# Patient Record
Sex: Male | Born: 1965 | Race: White | Hispanic: Yes | Smoking: Never smoker
Health system: Southern US, Community
[De-identification: ages and names within clinical notes are randomized; demographics above are authoritative.]

## PROBLEM LIST (undated history)

## (undated) DIAGNOSIS — I4891 Unspecified atrial fibrillation: Secondary | ICD-10-CM

## (undated) DIAGNOSIS — R030 Elevated blood-pressure reading, without diagnosis of hypertension: Secondary | ICD-10-CM

## (undated) DIAGNOSIS — F419 Anxiety disorder, unspecified: Secondary | ICD-10-CM

## (undated) DIAGNOSIS — N529 Male erectile dysfunction, unspecified: Secondary | ICD-10-CM

## (undated) DIAGNOSIS — IMO0001 Reserved for inherently not codable concepts without codable children: Secondary | ICD-10-CM

## (undated) HISTORY — DX: Reserved for inherently not codable concepts without codable children: IMO0001

## (undated) HISTORY — DX: Anxiety disorder, unspecified: F41.9

## (undated) HISTORY — DX: Male erectile dysfunction, unspecified: N52.9

## (undated) HISTORY — DX: Elevated blood-pressure reading, without diagnosis of hypertension: R03.0

---

## 2004-08-24 ENCOUNTER — Ambulatory Visit: Payer: Self-pay | Admitting: Internal Medicine

## 2004-08-27 ENCOUNTER — Ambulatory Visit: Payer: Self-pay | Admitting: Internal Medicine

## 2004-10-15 ENCOUNTER — Ambulatory Visit: Payer: Self-pay | Admitting: Internal Medicine

## 2005-02-08 ENCOUNTER — Ambulatory Visit: Payer: Self-pay | Admitting: Internal Medicine

## 2006-09-22 ENCOUNTER — Encounter: Payer: Self-pay | Admitting: Internal Medicine

## 2006-11-28 ENCOUNTER — Ambulatory Visit: Payer: Self-pay | Admitting: Internal Medicine

## 2006-11-28 LAB — CONVERTED CEMR LAB
AST: 21 units/L (ref 0–37)
Basophils Absolute: 0 10*3/uL (ref 0.0–0.1)
Bilirubin, Direct: 0.1 mg/dL (ref 0.0–0.3)
Chloride: 101 meq/L (ref 96–112)
Cholesterol: 198 mg/dL (ref 0–200)
Eosinophils Absolute: 0.1 10*3/uL (ref 0.0–0.6)
Eosinophils Relative: 1.6 % (ref 0.0–5.0)
GFR calc non Af Amer: 66 mL/min
Glucose, Bld: 93 mg/dL (ref 70–99)
HCT: 44.6 % (ref 39.0–52.0)
HDL: 54.3 mg/dL (ref >39.0)
Hemoglobin, Urine: NEGATIVE
Hemoglobin: 15.3 g/dL (ref 13.0–17.0)
LDL Cholesterol: 125 mg/dL — ABNORMAL HIGH (ref 0–99)
Lymphocytes Relative: 24 % (ref 12.0–46.0)
MCHC: 34.3 g/dL (ref 30.0–36.0)
MCV: 90.2 fL (ref 78.0–100.0)
Monocytes Absolute: 0.6 10*3/uL (ref 0.2–0.7)
Neutro Abs: 3.2 10*3/uL (ref 1.4–7.7)
Neutrophils Relative %: 62.9 % (ref 43.0–77.0)
Nitrite: NEGATIVE
Potassium: 4.8 meq/L (ref 3.5–5.1)
Sodium: 139 meq/L (ref 135–145)
TSH: 1.15 microintl units/mL (ref 0.35–5.50)
Urobilinogen, UA: 0.2 (ref 0.0–1.0)
WBC: 5.1 10*3/uL (ref 4.5–10.5)

## 2006-12-01 ENCOUNTER — Ambulatory Visit: Payer: Self-pay | Admitting: Internal Medicine

## 2006-12-01 DIAGNOSIS — N529 Male erectile dysfunction, unspecified: Secondary | ICD-10-CM

## 2007-04-05 ENCOUNTER — Ambulatory Visit: Payer: Self-pay | Admitting: Internal Medicine

## 2007-04-05 DIAGNOSIS — F17200 Nicotine dependence, unspecified, uncomplicated: Secondary | ICD-10-CM | POA: Insufficient documentation

## 2007-04-05 DIAGNOSIS — F411 Generalized anxiety disorder: Secondary | ICD-10-CM | POA: Insufficient documentation

## 2007-09-04 ENCOUNTER — Telehealth: Payer: Self-pay | Admitting: Internal Medicine

## 2008-01-24 ENCOUNTER — Ambulatory Visit: Payer: Self-pay | Admitting: Internal Medicine

## 2008-01-28 LAB — CONVERTED CEMR LAB
Albumin: 3.9 g/dL (ref 3.5–5.2)
BUN: 15 mg/dL (ref 6–23)
Basophils Relative: 0.1 % (ref 0.0–3.0)
Calcium: 9.6 mg/dL (ref 8.4–10.5)
Cholesterol: 227 mg/dL (ref 0–200)
Creatinine, Ser: 1 mg/dL (ref 0.4–1.5)
Direct LDL: 146.3 mg/dL
Eosinophils Absolute: 0.1 10*3/uL (ref 0.0–0.7)
Eosinophils Relative: 2 % (ref 0.0–5.0)
FSH: 2.2 milliintl units/mL
GFR calc Af Amer: 106 mL/min
GFR calc non Af Amer: 88 mL/min
HCT: 44.4 % (ref 39.0–52.0)
HDL: 56.5 mg/dL (ref >39.0)
Hgb A1c MFr Bld: 5.7 % (ref 4.6–6.0)
Ketones, ur: NEGATIVE mg/dL
LH: 4 milliintl units/mL
MCHC: 34.6 g/dL (ref 30.0–36.0)
MCV: 89.3 fL (ref 78.0–100.0)
Monocytes Absolute: 0.6 10*3/uL (ref 0.1–1.0)
RBC: 4.97 M/uL (ref 4.22–5.81)
Specific Gravity, Urine: >=1.03 (ref 1.000–1.03)
Total Protein, Urine: NEGATIVE mg/dL
Urine Glucose: NEGATIVE mg/dL
WBC: 4.5 10*3/uL (ref 4.5–10.5)

## 2008-02-01 ENCOUNTER — Ambulatory Visit: Payer: Self-pay | Admitting: Internal Medicine

## 2008-02-01 DIAGNOSIS — R7309 Other abnormal glucose: Secondary | ICD-10-CM

## 2008-02-23 ENCOUNTER — Emergency Department (HOSPITAL_COMMUNITY): Admission: EM | Admit: 2008-02-23 | Discharge: 2008-02-23 | Payer: Self-pay | Admitting: Emergency Medicine

## 2010-03-01 ENCOUNTER — Other Ambulatory Visit: Payer: Self-pay | Admitting: Internal Medicine

## 2010-03-01 ENCOUNTER — Other Ambulatory Visit: Payer: Self-pay

## 2010-03-01 ENCOUNTER — Encounter (INDEPENDENT_AMBULATORY_CARE_PROVIDER_SITE_OTHER): Payer: Self-pay | Admitting: *Deleted

## 2010-03-01 DIAGNOSIS — Z0389 Encounter for observation for other suspected diseases and conditions ruled out: Secondary | ICD-10-CM

## 2010-03-01 DIAGNOSIS — Z0000 Encounter for general adult medical examination without abnormal findings: Secondary | ICD-10-CM

## 2010-03-01 LAB — LIPID PANEL
Cholesterol: 183 mg/dL (ref 0–200)
HDL: 75.5 mg/dL (ref >39.00)
Triglycerides: 39 mg/dL (ref 0.0–149.0)

## 2010-03-01 LAB — HEPATIC FUNCTION PANEL
Albumin: 4.1 g/dL (ref 3.5–5.2)
Alkaline Phosphatase: 41 U/L (ref 39–117)

## 2010-03-01 LAB — CBC WITH DIFFERENTIAL/PLATELET
Basophils Absolute: 0 10*3/uL (ref 0.0–0.1)
Eosinophils Absolute: 0.1 10*3/uL (ref 0.0–0.7)
Hemoglobin: 15.6 g/dL (ref 13.0–17.0)
Lymphocytes Relative: 27.7 % (ref 12.0–46.0)
Lymphs Abs: 1.2 10*3/uL (ref 0.7–4.0)
MCHC: 34.3 g/dL (ref 30.0–36.0)
Neutro Abs: 2.4 10*3/uL (ref 1.4–7.7)
RDW: 12.6 % (ref 11.5–14.6)

## 2010-03-01 LAB — BASIC METABOLIC PANEL
CO2: 31 mEq/L (ref 19–32)
Calcium: 9.5 mg/dL (ref 8.4–10.5)
Creatinine, Ser: 1 mg/dL (ref 0.4–1.5)
Glucose, Bld: 98 mg/dL (ref 70–99)
Sodium: 139 mEq/L (ref 135–145)

## 2010-03-01 LAB — URINALYSIS
Bilirubin Urine: NEGATIVE
Ketones, ur: NEGATIVE
Total Protein, Urine: NEGATIVE
Urine Glucose: NEGATIVE
pH: 6.5 (ref 5.0–8.0)

## 2010-03-01 LAB — TSH: TSH: 1.23 u[IU]/mL (ref 0.35–5.50)

## 2010-03-03 ENCOUNTER — Encounter: Payer: Self-pay | Admitting: Internal Medicine

## 2010-03-03 ENCOUNTER — Ambulatory Visit: Payer: Self-pay | Admitting: Internal Medicine

## 2010-04-21 ENCOUNTER — Encounter: Payer: Self-pay | Admitting: Internal Medicine

## 2010-05-10 LAB — DIFFERENTIAL
Basophils Absolute: 0 10*3/uL (ref 0.0–0.1)
Basophils Relative: 0 % (ref 0–1)
Eosinophils Absolute: 0.1 K/uL (ref 0.0–0.7)
Eosinophils Relative: 1 % (ref 0–5)
Lymphocytes Relative: 20 % (ref 12–46)
Lymphs Abs: 1.1 K/uL (ref 0.7–4.0)
Monocytes Absolute: 0.8 10*3/uL (ref 0.1–1.0)
Monocytes Relative: 15 % — ABNORMAL HIGH (ref 3–12)
Neutro Abs: 3.5 10*3/uL (ref 1.7–7.7)
Neutrophils Relative %: 63 % (ref 43–77)

## 2010-05-10 LAB — URINALYSIS, ROUTINE W REFLEX MICROSCOPIC
Bilirubin Urine: NEGATIVE
Glucose, UA: NEGATIVE mg/dL
Leukocytes, UA: NEGATIVE
Nitrite: NEGATIVE
Protein, ur: NEGATIVE mg/dL
Specific Gravity, Urine: 1.04 — ABNORMAL HIGH (ref 1.005–1.030)
Urobilinogen, UA: 0.2 mg/dL (ref 0.0–1.0)
pH: 5.5 (ref 5.0–8.0)

## 2010-05-10 LAB — BASIC METABOLIC PANEL
Calcium: 8.9 mg/dL (ref 8.4–10.5)
Creatinine, Ser: 1.17 mg/dL (ref 0.4–1.5)
GFR calc Af Amer: >60 mL/min (ref >60)
GFR calc non Af Amer: >60 mL/min (ref >60)
Glucose, Bld: 121 mg/dL — ABNORMAL HIGH (ref 70–99)
Sodium: 140 mEq/L (ref 135–145)

## 2010-05-10 LAB — BASIC METABOLIC PANEL WITH GFR
BUN: 12 mg/dL (ref 6–23)
CO2: 25 meq/L (ref 19–32)
Chloride: 108 meq/L (ref 96–112)
Potassium: 4.1 meq/L (ref 3.5–5.1)

## 2010-05-10 LAB — CBC
HCT: 43.1 % (ref 39.0–52.0)
Hemoglobin: 14.4 g/dL (ref 13.0–17.0)
MCHC: 33.5 g/dL (ref 30.0–36.0)
MCV: 89.4 fL (ref 78.0–100.0)
Platelets: 247 K/uL (ref 150–400)
RBC: 4.82 MIL/uL (ref 4.22–5.81)
RDW: 12.4 % (ref 11.5–15.5)
WBC: 5.5 K/uL (ref 4.0–10.5)

## 2010-05-10 LAB — URINE MICROSCOPIC-ADD ON

## 2010-06-04 ENCOUNTER — Encounter: Payer: Self-pay | Admitting: Internal Medicine

## 2010-06-07 ENCOUNTER — Encounter: Payer: Self-pay | Admitting: Internal Medicine

## 2010-06-07 DIAGNOSIS — Z0289 Encounter for other administrative examinations: Secondary | ICD-10-CM

## 2012-05-23 ENCOUNTER — Telehealth: Payer: Self-pay | Admitting: *Deleted

## 2012-05-23 DIAGNOSIS — Z0000 Encounter for general adult medical examination without abnormal findings: Secondary | ICD-10-CM

## 2012-05-23 DIAGNOSIS — Z125 Encounter for screening for malignant neoplasm of prostate: Secondary | ICD-10-CM

## 2012-05-23 NOTE — Telephone Encounter (Signed)
Labs entered. Pt informed  

## 2012-05-23 NOTE — Telephone Encounter (Signed)
Message copied by Merrilyn Puma on Wed May 23, 2012  2:47 PM ------      Message from: Livingston Diones      Created: Wed May 16, 2012  9:13 AM       Pt called req lab order for CPE. Pt has an appt on 06/04/12. Please put in lab order a week prior. Please call pt if this is ok [phone]            Thanks      Cindee Lame ------

## 2012-06-04 ENCOUNTER — Encounter: Payer: Self-pay | Admitting: Internal Medicine

## 2012-06-15 ENCOUNTER — Other Ambulatory Visit (INDEPENDENT_AMBULATORY_CARE_PROVIDER_SITE_OTHER): Payer: Self-pay

## 2012-06-15 DIAGNOSIS — Z125 Encounter for screening for malignant neoplasm of prostate: Secondary | ICD-10-CM

## 2012-06-15 DIAGNOSIS — Z0000 Encounter for general adult medical examination without abnormal findings: Secondary | ICD-10-CM

## 2012-06-15 LAB — CBC WITH DIFFERENTIAL/PLATELET
Basophils Relative: 0.5 % (ref 0.0–3.0)
Eosinophils Relative: 4.3 % (ref 0.0–5.0)
HCT: 45.3 % (ref 39.0–52.0)
Hemoglobin: 15.6 g/dL (ref 13.0–17.0)
Lymphocytes Relative: 29.4 % (ref 12.0–46.0)
Lymphs Abs: 1.5 10*3/uL (ref 0.7–4.0)
Monocytes Relative: 13.4 % — ABNORMAL HIGH (ref 3.0–12.0)
Neutro Abs: 2.7 10*3/uL (ref 1.4–7.7)
RBC: 5.13 Mil/uL (ref 4.22–5.81)
RDW: 12.5 % (ref 11.5–14.6)
WBC: 5.2 10*3/uL (ref 4.5–10.5)

## 2012-06-15 LAB — URINALYSIS, ROUTINE W REFLEX MICROSCOPIC
Bilirubin Urine: NEGATIVE
Ketones, ur: NEGATIVE
Leukocytes, UA: NEGATIVE
Nitrite: NEGATIVE
Specific Gravity, Urine: >=1.03 (ref 1.000–1.030)
Total Protein, Urine: NEGATIVE
pH: 6 (ref 5.0–8.0)

## 2012-06-15 LAB — LIPID PANEL
HDL: 64.2 mg/dL (ref >39.00)
LDL Cholesterol: 107 mg/dL — ABNORMAL HIGH (ref 0–99)
Total CHOL/HDL Ratio: 3
VLDL: 12.6 mg/dL (ref 0.0–40.0)

## 2012-06-15 LAB — HEPATIC FUNCTION PANEL
AST: 17 U/L (ref 0–37)
Albumin: 4 g/dL (ref 3.5–5.2)
Alkaline Phosphatase: 41 U/L (ref 39–117)
Total Bilirubin: 0.5 mg/dL (ref 0.3–1.2)

## 2012-06-15 LAB — BASIC METABOLIC PANEL
Calcium: 9.3 mg/dL (ref 8.4–10.5)
GFR: 84.16 mL/min (ref >60.00)
Glucose, Bld: 96 mg/dL (ref 70–99)
Potassium: 4.7 mEq/L (ref 3.5–5.1)
Sodium: 140 mEq/L (ref 135–145)

## 2012-06-15 LAB — PSA: PSA: 0.43 ng/mL (ref 0.10–4.00)

## 2012-06-20 ENCOUNTER — Ambulatory Visit (INDEPENDENT_AMBULATORY_CARE_PROVIDER_SITE_OTHER): Payer: PRIVATE HEALTH INSURANCE | Admitting: Internal Medicine

## 2012-06-20 ENCOUNTER — Encounter: Payer: Self-pay | Admitting: Internal Medicine

## 2012-06-20 VITALS — BP 128/82 | HR 76 | Temp 98.0°F | Resp 16 | Ht 68.5 in | Wt 238.0 lb

## 2012-06-20 DIAGNOSIS — H6120 Impacted cerumen, unspecified ear: Secondary | ICD-10-CM

## 2012-06-20 DIAGNOSIS — Z0000 Encounter for general adult medical examination without abnormal findings: Secondary | ICD-10-CM | POA: Insufficient documentation

## 2012-06-20 DIAGNOSIS — N529 Male erectile dysfunction, unspecified: Secondary | ICD-10-CM

## 2012-06-20 DIAGNOSIS — H6123 Impacted cerumen, bilateral: Secondary | ICD-10-CM

## 2012-06-20 MED ORDER — SILDENAFIL CITRATE 100 MG PO TABS: 100.0000 mg | ORAL_TABLET | ORAL | Status: DC | PRN

## 2012-06-20 NOTE — Assessment & Plan Note (Signed)
Testost check Viagra

## 2012-06-20 NOTE — Assessment & Plan Note (Signed)
We discussed age appropriate health related issues, including available/recomended screening tests and vaccinations. We discussed a need for adhering to healthy diet and exercise. Labs/EKG were reviewed/ordered. All questions were answered.   

## 2012-06-20 NOTE — Assessment & Plan Note (Signed)
He will irrigate at home 

## 2012-06-20 NOTE — Progress Notes (Signed)
  Subjective:     HPI  The patient is here for a wellness exam. The patient has been doing well overall without major physical or psychological issues going on lately. F/u ED - not new  Review of Systems  Constitutional: Negative for appetite change, fatigue and unexpected weight change.  HENT: Negative for nosebleeds, congestion, sore throat, sneezing, trouble swallowing and neck pain.   Eyes: Negative for itching and visual disturbance.  Respiratory: Negative for cough.   Cardiovascular: Negative for chest pain, palpitations and leg swelling.  Gastrointestinal: Negative for nausea, diarrhea, blood in stool and abdominal distention.  Genitourinary: Negative for frequency and hematuria.  Musculoskeletal: Negative for back pain, joint swelling and gait problem.  Skin: Negative for rash.  Neurological: Negative for dizziness, tremors, speech difficulty and weakness.  Psychiatric/Behavioral: Negative for sleep disturbance, dysphoric mood and agitation. The patient is not nervous/anxious.        Objective:   Physical Exam  Constitutional: He is oriented to person, place, and time. He appears well-developed. No distress.  HENT:  Mouth/Throat: Oropharynx is clear and moist.  Eyes: Conjunctivae are normal. Pupils are equal, round, and reactive to light.  Neck: Normal range of motion. No JVD present. No thyromegaly present.  Cardiovascular: Normal rate, regular rhythm, normal heart sounds and intact distal pulses.  Exam reveals no gallop and no friction rub.   No murmur heard. Pulmonary/Chest: Effort normal and breath sounds normal. No respiratory distress. He has no wheezes. He has no rales. He exhibits no tenderness.  Abdominal: Soft. Bowel sounds are normal. He exhibits no distension and no mass. There is no tenderness. There is no rebound and no guarding.  Musculoskeletal: Normal range of motion. He exhibits no edema and no tenderness.  Lymphadenopathy:    He has no cervical  adenopathy.  Neurological: He is alert and oriented to person, place, and time. He has normal reflexes. No cranial nerve deficit. He exhibits normal muscle tone. He displays a negative Romberg sign. Coordination and gait normal.  Skin: Skin is warm and dry. No rash noted.  Psychiatric: He has a normal mood and affect. His behavior is normal. Judgment and thought content normal.  ear wax B  Lab Results  Component Value Date   WBC 5.2 06/15/2012   HGB 15.6 06/15/2012   HCT 45.3 06/15/2012   PLT 242.0 06/15/2012   GLUCOSE 96 06/15/2012   CHOL 184 06/15/2012   TRIG 63.0 06/15/2012   HDL 64.20 06/15/2012   LDLDIRECT 146.3 01/24/2008   LDLCALC 107* 06/15/2012   ALT 17 06/15/2012   AST 17 06/15/2012   NA 140 06/15/2012   K 4.7 06/15/2012   CL 104 06/15/2012   CREATININE 1.0 06/15/2012   BUN 19 06/15/2012   CO2 29 06/15/2012   TSH 1.08 06/15/2012   PSA 0.43 06/15/2012   HGBA1C 5.7 01/24/2008           Assessment & Plan:

## 2012-06-29 ENCOUNTER — Telehealth: Payer: Self-pay | Admitting: *Deleted

## 2012-06-29 DIAGNOSIS — Z0000 Encounter for general adult medical examination without abnormal findings: Secondary | ICD-10-CM

## 2012-06-29 DIAGNOSIS — Z0389 Encounter for observation for other suspected diseases and conditions ruled out: Secondary | ICD-10-CM

## 2012-06-29 NOTE — Telephone Encounter (Signed)
CPX labs entered for next year's physical.

## 2012-06-29 NOTE — Telephone Encounter (Signed)
Message copied by Carin Primrose on Fri Jun 29, 2012 11:12 AM ------      Message from: Etheleen Sia      Created: Wed Jun 20, 2012 10:21 AM      Regarding: LABS       PHYSICAL LABS FOR June 24 2013 APPT ------

## 2012-07-26 ENCOUNTER — Other Ambulatory Visit: Payer: PRIVATE HEALTH INSURANCE

## 2012-07-26 DIAGNOSIS — N529 Male erectile dysfunction, unspecified: Secondary | ICD-10-CM

## 2012-07-30 LAB — TESTOSTERONE, FREE, TOTAL, SHBG
Sex Hormone Binding: 41 nmol/L (ref 13–71)
Testosterone, Free: 61.7 pg/mL (ref 47.0–244.0)
Testosterone-% Free: 1.7 % (ref 1.6–2.9)
Testosterone: 353 ng/dL (ref 300–890)

## 2013-06-24 ENCOUNTER — Ambulatory Visit (INDEPENDENT_AMBULATORY_CARE_PROVIDER_SITE_OTHER): Payer: Managed Care, Other (non HMO) | Admitting: Internal Medicine

## 2013-06-24 ENCOUNTER — Encounter: Payer: Self-pay | Admitting: Internal Medicine

## 2013-06-24 ENCOUNTER — Telehealth: Payer: Self-pay | Admitting: Internal Medicine

## 2013-06-24 VITALS — BP 120/72 | HR 80 | Temp 98.3°F | Resp 16 | Ht 68.5 in | Wt 249.0 lb

## 2013-06-24 DIAGNOSIS — Z23 Encounter for immunization: Secondary | ICD-10-CM

## 2013-06-24 DIAGNOSIS — N529 Male erectile dysfunction, unspecified: Secondary | ICD-10-CM

## 2013-06-24 DIAGNOSIS — Z0000 Encounter for general adult medical examination without abnormal findings: Secondary | ICD-10-CM

## 2013-06-24 DIAGNOSIS — F17200 Nicotine dependence, unspecified, uncomplicated: Secondary | ICD-10-CM

## 2013-06-24 DIAGNOSIS — H547 Unspecified visual loss: Secondary | ICD-10-CM

## 2013-06-24 MED ORDER — TADALAFIL 20 MG PO TABS: 20.0000 mg | ORAL_TABLET | ORAL | Status: DC | PRN

## 2013-06-24 NOTE — Progress Notes (Signed)
Pre visit review using our clinic review tool, if applicable. No additional management support is needed unless otherwise documented below in the visit note. 

## 2013-06-24 NOTE — Telephone Encounter (Signed)
Relevant patient education mailed to patient.  

## 2013-06-24 NOTE — Assessment & Plan Note (Signed)
We discussed age appropriate health related issues, including available/recomended screening tests and vaccinations. We discussed a need for adhering to healthy diet and exercise. Labs/EKG were reviewed/ordered. All questions were answered. Using a "herbal chew"

## 2013-06-24 NOTE — Patient Instructions (Signed)
Wt Readings from Last 3 Encounters:  06/24/13 249 lb (112.946 kg)  06/20/12 238 lb (107.956 kg)  02/01/08 253 lb (114.76 kg)

## 2013-06-24 NOTE — Progress Notes (Signed)
   Subjective:     HPI  The patient is here for a wellness exam. The patient has been doing well overall without major physical or psychological issues going on lately. Libido  Is good. F/u ED - not new  Review of Systems  Constitutional: Negative for appetite change, fatigue and unexpected weight change.  HENT: Negative for congestion, nosebleeds, sneezing, sore throat and trouble swallowing.   Eyes: Negative for itching and visual disturbance.  Respiratory: Negative for cough.   Cardiovascular: Negative for chest pain, palpitations and leg swelling.  Gastrointestinal: Negative for nausea, diarrhea, blood in stool and abdominal distention.  Genitourinary: Negative for frequency and hematuria.  Musculoskeletal: Negative for back pain, gait problem, joint swelling and neck pain.  Skin: Negative for rash.  Neurological: Negative for dizziness, tremors, speech difficulty and weakness.  Psychiatric/Behavioral: Negative for sleep disturbance, dysphoric mood and agitation. The patient is not nervous/anxious.        Objective:   Physical Exam  Constitutional: He is oriented to person, place, and time. He appears well-developed. No distress.  HENT:  Mouth/Throat: Oropharynx is clear and moist.  Eyes: Conjunctivae are normal. Pupils are equal, round, and reactive to light.  Neck: Normal range of motion. No JVD present. No thyromegaly present.  Cardiovascular: Normal rate, regular rhythm, normal heart sounds and intact distal pulses.  Exam reveals no gallop and no friction rub.   No murmur heard. Pulmonary/Chest: Effort normal and breath sounds normal. No respiratory distress. He has no wheezes. He has no rales. He exhibits no tenderness.  Abdominal: Soft. Bowel sounds are normal. He exhibits no distension and no mass. There is no tenderness. There is no rebound and no guarding.  Genitourinary: Penis normal.  Testes nl  Musculoskeletal: Normal range of motion. He exhibits no edema and no  tenderness.  Lymphadenopathy:    He has no cervical adenopathy.  Neurological: He is alert and oriented to person, place, and time. He has normal reflexes. No cranial nerve deficit. He exhibits normal muscle tone. He displays a negative Romberg sign. Coordination and gait normal.  Skin: Skin is warm and dry. No rash noted.  Psychiatric: He has a normal mood and affect. His behavior is normal. Judgment and thought content normal.  ear wax B  Lab Results  Component Value Date   WBC 5.2 06/15/2012   HGB 15.6 06/15/2012   HCT 45.3 06/15/2012   PLT 242.0 06/15/2012   GLUCOSE 96 06/15/2012   CHOL 184 06/15/2012   TRIG 63.0 06/15/2012   HDL 64.20 06/15/2012   LDLDIRECT 146.3 01/24/2008   LDLCALC 107* 06/15/2012   ALT 17 06/15/2012   AST 17 06/15/2012   NA 140 06/15/2012   K 4.7 06/15/2012   CL 104 06/15/2012   CREATININE 1.0 06/15/2012   BUN 19 06/15/2012   CO2 29 06/15/2012   TSH 1.08 06/15/2012   PSA 0.43 06/15/2012   HGBA1C 5.7 01/24/2008           Assessment & Plan:

## 2013-06-24 NOTE — Assessment & Plan Note (Signed)
Quitting chewing

## 2013-06-24 NOTE — Assessment & Plan Note (Signed)
Will try Cialis prn. Better

## 2014-03-11 ENCOUNTER — Telehealth: Payer: Self-pay | Admitting: *Deleted

## 2014-03-11 MED ORDER — TADALAFIL 5 MG PO TABS: 5.0000 mg | ORAL_TABLET | Freq: Every day | ORAL | Status: DC

## 2014-03-11 NOTE — Telephone Encounter (Signed)
Yes AP

## 2014-03-11 NOTE — Telephone Encounter (Signed)
Called pt no answer LMOM md sent new rx to [facility]...Raechel Chute

## 2014-03-11 NOTE — Telephone Encounter (Signed)
Left msg on triage stating md rx Cialis 10 mg as needed. Wanting to see if md can change to everyday use 5mg ...Raechel Chute

## 2014-06-30 ENCOUNTER — Telehealth: Payer: Self-pay

## 2014-06-30 ENCOUNTER — Other Ambulatory Visit (INDEPENDENT_AMBULATORY_CARE_PROVIDER_SITE_OTHER): Payer: Managed Care, Other (non HMO)

## 2014-06-30 DIAGNOSIS — N529 Male erectile dysfunction, unspecified: Secondary | ICD-10-CM

## 2014-06-30 DIAGNOSIS — Z0000 Encounter for general adult medical examination without abnormal findings: Secondary | ICD-10-CM

## 2014-06-30 LAB — CBC WITH DIFFERENTIAL/PLATELET
Basophils Absolute: 0 10*3/uL (ref 0.0–0.1)
Basophils Relative: 0.3 % (ref 0.0–3.0)
EOS ABS: 0.2 10*3/uL (ref 0.0–0.7)
EOS PCT: 3.4 % (ref 0.0–5.0)
HCT: 46.5 % (ref 39.0–52.0)
Hemoglobin: 15.4 g/dL (ref 13.0–17.0)
LYMPHS ABS: 1.4 10*3/uL (ref 0.7–4.0)
LYMPHS PCT: 29.6 % (ref 12.0–46.0)
MCHC: 33.2 g/dL (ref 30.0–36.0)
MCV: 91.9 fl (ref 78.0–100.0)
Monocytes Absolute: 0.7 10*3/uL (ref 0.1–1.0)
Monocytes Relative: 14.5 % — ABNORMAL HIGH (ref 3.0–12.0)
NEUTROS PCT: 52.2 % (ref 43.0–77.0)
Neutro Abs: 2.4 10*3/uL (ref 1.4–7.7)
PLATELETS: 265 10*3/uL (ref 150.0–400.0)
RBC: 5.05 Mil/uL (ref 4.22–5.81)
RDW: 13 % (ref 11.5–15.5)
WBC: 4.6 10*3/uL (ref 4.0–10.5)

## 2014-06-30 LAB — LIPID PANEL
Cholesterol: 168 mg/dL (ref 0–200)
HDL: 66 mg/dL (ref >39.00)
LDL Cholesterol: 87 mg/dL (ref 0–99)
NonHDL: 102
Total CHOL/HDL Ratio: 3
Triglycerides: 75 mg/dL (ref 0.0–149.0)
VLDL: 15 mg/dL (ref 0.0–40.0)

## 2014-06-30 LAB — URINALYSIS, ROUTINE W REFLEX MICROSCOPIC
Bilirubin Urine: NEGATIVE
HGB URINE DIPSTICK: NEGATIVE
Ketones, ur: NEGATIVE
LEUKOCYTES UA: NEGATIVE
Nitrite: NEGATIVE
Total Protein, Urine: NEGATIVE
Urine Glucose: NEGATIVE
Urobilinogen, UA: 0.2 (ref 0.0–1.0)
pH: 6 (ref 5.0–8.0)

## 2014-06-30 LAB — BASIC METABOLIC PANEL
BUN: 14 mg/dL (ref 6–23)
CALCIUM: 9.2 mg/dL (ref 8.4–10.5)
CHLORIDE: 104 meq/L (ref 96–112)
CO2: 31 mEq/L (ref 19–32)
Creatinine, Ser: 1.11 mg/dL (ref 0.40–1.50)
GFR: 75.64 mL/min (ref >60.00)
Glucose, Bld: 96 mg/dL (ref 70–99)
POTASSIUM: 4.7 meq/L (ref 3.5–5.1)
Sodium: 140 mEq/L (ref 135–145)

## 2014-06-30 LAB — PSA: PSA: 0.69 ng/mL (ref 0.10–4.00)

## 2014-06-30 LAB — TSH: TSH: 1.16 u[IU]/mL (ref 0.35–4.50)

## 2014-06-30 NOTE — Addendum Note (Signed)
Addended by: Tonye Becket on: 06/30/2014 02:48 PM   Modules accepted: Orders, SmartSet

## 2014-06-30 NOTE — Telephone Encounter (Signed)
Lab called. Stated that pt was there and that there are no labs in the computer.   Please advise if labs need to be entered.  Pt left and will come back when labs are entered. (Just need to let the pt know).

## 2014-06-30 NOTE — Addendum Note (Signed)
Addended by: Tonye Becket on: 06/30/2014 02:49 PM   Modules accepted: Orders, SmartSet

## 2014-06-30 NOTE — Telephone Encounter (Signed)
Labs entered.

## 2014-07-01 LAB — TESTOSTERONE, FREE, TOTAL, SHBG
Sex Hormone Binding: 35 nmol/L (ref 10–50)
TESTOSTERONE-% FREE: 2.1 % (ref 1.6–2.9)
Testosterone, Free: 119.2 pg/mL (ref 47.0–244.0)
Testosterone: 575 ng/dL (ref 300–890)

## 2014-07-03 ENCOUNTER — Ambulatory Visit (INDEPENDENT_AMBULATORY_CARE_PROVIDER_SITE_OTHER): Payer: Managed Care, Other (non HMO) | Admitting: Internal Medicine

## 2014-07-03 ENCOUNTER — Encounter: Payer: Self-pay | Admitting: Internal Medicine

## 2014-07-03 VITALS — BP 130/98 | HR 55 | Ht 68.5 in | Wt 228.0 lb

## 2014-07-03 DIAGNOSIS — Z0000 Encounter for general adult medical examination without abnormal findings: Secondary | ICD-10-CM | POA: Diagnosis not present

## 2014-07-03 DIAGNOSIS — N529 Male erectile dysfunction, unspecified: Secondary | ICD-10-CM

## 2014-07-03 DIAGNOSIS — N528 Other male erectile dysfunction: Secondary | ICD-10-CM | POA: Diagnosis not present

## 2014-07-03 LAB — VITAMIN D 1,25 DIHYDROXY
VITAMIN D 1, 25 (OH) TOTAL: 50 pg/mL (ref 18–72)
Vitamin D3 1, 25 (OH)2: 50 pg/mL

## 2014-07-03 MED ORDER — TADALAFIL 5 MG PO TABS: 5.0000 mg | ORAL_TABLET | Freq: Every day | ORAL | Status: DC

## 2014-07-03 NOTE — Progress Notes (Signed)
Pre visit review using our clinic review tool, if applicable. No additional management support is needed unless otherwise documented below in the visit note. 

## 2014-07-03 NOTE — Assessment & Plan Note (Signed)
We discussed age appropriate health related issues, including available/recomended screening tests and vaccinations. We discussed a need for adhering to healthy diet and exercise. Labs/EKG were reviewed/ordered. All questions were answered.   

## 2014-07-03 NOTE — Assessment & Plan Note (Signed)
On Cialis rx

## 2014-07-03 NOTE — Progress Notes (Signed)
   Subjective:     HPI  The patient is here for a wellness exam. The patient has been doing well overall without major physical or psychological issues going on lately. Libido is good. F/u ED - not new  BP Readings from Last 3 Encounters:  07/03/14 130/98  06/24/13 120/72  06/20/12 128/82   Wt Readings from Last 3 Encounters:  07/03/14 228 lb (103.42 kg)  06/24/13 249 lb (112.946 kg)  06/20/12 238 lb (107.956 kg)      Review of Systems  Constitutional: Negative for appetite change, fatigue and unexpected weight change.  HENT: Negative for congestion, nosebleeds, sneezing, sore throat and trouble swallowing.   Eyes: Negative for itching and visual disturbance.  Respiratory: Negative for cough.   Cardiovascular: Negative for chest pain, palpitations and leg swelling.  Gastrointestinal: Negative for nausea, diarrhea, blood in stool and abdominal distention.  Genitourinary: Negative for frequency and hematuria.  Musculoskeletal: Negative for back pain, joint swelling, gait problem and neck pain.  Skin: Negative for rash.  Neurological: Negative for dizziness, tremors, speech difficulty and weakness.  Psychiatric/Behavioral: Negative for sleep disturbance, dysphoric mood and agitation. The patient is not nervous/anxious.        Objective:   Physical Exam  Constitutional: He is oriented to person, place, and time. He appears well-developed. No distress.  HENT:  Mouth/Throat: Oropharynx is clear and moist.  Eyes: Conjunctivae are normal. Pupils are equal, round, and reactive to light.  Neck: Normal range of motion. No JVD present. No thyromegaly present.  Cardiovascular: Normal rate, regular rhythm, normal heart sounds and intact distal pulses.  Exam reveals no gallop and no friction rub.   No murmur heard. Pulmonary/Chest: Effort normal and breath sounds normal. No respiratory distress. He has no wheezes. He has no rales. He exhibits no tenderness.  Abdominal: Soft. Bowel  sounds are normal. He exhibits no distension and no mass. There is no tenderness. There is no rebound and no guarding.  Genitourinary: Penis normal.  Testes nl  Musculoskeletal: Normal range of motion. He exhibits no edema or tenderness.  Lymphadenopathy:    He has no cervical adenopathy.  Neurological: He is alert and oriented to person, place, and time. He has normal reflexes. No cranial nerve deficit. He exhibits normal muscle tone. He displays a negative Romberg sign. Coordination and gait normal.  Skin: Skin is warm and dry. No rash noted.  Psychiatric: He has a normal mood and affect. His behavior is normal. Judgment and thought content normal.  No ear wax B  Lab Results  Component Value Date   WBC 4.6 06/30/2014   HGB 15.4 06/30/2014   HCT 46.5 06/30/2014   PLT 265.0 06/30/2014   GLUCOSE 96 06/30/2014   CHOL 168 06/30/2014   TRIG 75.0 06/30/2014   HDL 66.00 06/30/2014   LDLDIRECT 146.3 01/24/2008   LDLCALC 87 06/30/2014   ALT 17 06/15/2012   AST 17 06/15/2012   NA 140 06/30/2014   K 4.7 06/30/2014   CL 104 06/30/2014   CREATININE 1.11 06/30/2014   BUN 14 06/30/2014   CO2 31 06/30/2014   TSH 1.16 06/30/2014   PSA 0.69 06/30/2014   HGBA1C 5.7 01/24/2008           Assessment & Plan:

## 2015-02-04 ENCOUNTER — Telehealth: Payer: Self-pay | Admitting: Internal Medicine

## 2015-02-04 NOTE — Telephone Encounter (Signed)
Pt called in and he is need to see an eye doctor. He is wondering if you have any recommendations and he needs one that is fluent in Spanish Please advise

## 2015-02-04 NOTE — Telephone Encounter (Signed)
[facility]  Ask office - I think they have a Spanish speaking doctor Thx

## 2015-02-05 NOTE — Telephone Encounter (Signed)
Informed pt .

## 2015-07-02 ENCOUNTER — Other Ambulatory Visit: Payer: Self-pay | Admitting: *Deleted

## 2015-07-02 ENCOUNTER — Other Ambulatory Visit (INDEPENDENT_AMBULATORY_CARE_PROVIDER_SITE_OTHER): Payer: Managed Care, Other (non HMO)

## 2015-07-02 DIAGNOSIS — Z0000 Encounter for general adult medical examination without abnormal findings: Secondary | ICD-10-CM

## 2015-07-02 LAB — URINALYSIS, ROUTINE W REFLEX MICROSCOPIC
Bilirubin Urine: NEGATIVE
HGB URINE DIPSTICK: NEGATIVE
Ketones, ur: NEGATIVE
Leukocytes, UA: NEGATIVE
NITRITE: NEGATIVE
RBC / HPF: NONE SEEN (ref 0–?)
SPECIFIC GRAVITY, URINE: 1.02 (ref 1.000–1.030)
Total Protein, Urine: NEGATIVE
Urine Glucose: NEGATIVE
Urobilinogen, UA: 0.2 (ref 0.0–1.0)
WBC UA: NONE SEEN (ref 0–?)
pH: 6 (ref 5.0–8.0)

## 2015-07-02 LAB — COMPREHENSIVE METABOLIC PANEL
ALT: 14 U/L (ref 0–53)
AST: 14 U/L (ref 0–37)
Albumin: 4.2 g/dL (ref 3.5–5.2)
Alkaline Phosphatase: 45 U/L (ref 39–117)
BILIRUBIN TOTAL: 0.7 mg/dL (ref 0.2–1.2)
BUN: 15 mg/dL (ref 6–23)
CALCIUM: 9.4 mg/dL (ref 8.4–10.5)
CO2: 31 meq/L (ref 19–32)
CREATININE: 1.17 mg/dL (ref 0.40–1.50)
Chloride: 103 mEq/L (ref 96–112)
GFR: 70.88 mL/min (ref >60.00)
GLUCOSE: 100 mg/dL — AB (ref 70–99)
Potassium: 4.7 mEq/L (ref 3.5–5.1)
SODIUM: 141 meq/L (ref 135–145)
Total Protein: 6.5 g/dL (ref 6.0–8.3)

## 2015-07-02 LAB — CBC WITH DIFFERENTIAL/PLATELET
BASOS PCT: 0.3 % (ref 0.0–3.0)
Basophils Absolute: 0 10*3/uL (ref 0.0–0.1)
EOS ABS: 0.2 10*3/uL (ref 0.0–0.7)
Eosinophils Relative: 3.4 % (ref 0.0–5.0)
HEMATOCRIT: 45.4 % (ref 39.0–52.0)
HEMOGLOBIN: 15.5 g/dL (ref 13.0–17.0)
Lymphocytes Relative: 25.6 % (ref 12.0–46.0)
Lymphs Abs: 1.3 10*3/uL (ref 0.7–4.0)
MCHC: 34.1 g/dL (ref 30.0–36.0)
MCV: 90 fl (ref 78.0–100.0)
Monocytes Absolute: 0.7 10*3/uL (ref 0.1–1.0)
Monocytes Relative: 13.5 % — ABNORMAL HIGH (ref 3.0–12.0)
NEUTROS ABS: 3 10*3/uL (ref 1.4–7.7)
Neutrophils Relative %: 57.2 % (ref 43.0–77.0)
PLATELETS: 219 10*3/uL (ref 150.0–400.0)
RBC: 5.05 Mil/uL (ref 4.22–5.81)
RDW: 12.3 % (ref 11.5–15.5)
WBC: 5.2 10*3/uL (ref 4.0–10.5)

## 2015-07-02 LAB — LIPID PANEL
CHOL/HDL RATIO: 3
Cholesterol: 191 mg/dL (ref 0–200)
HDL: 61.7 mg/dL (ref >39.00)
LDL Cholesterol: 111 mg/dL — ABNORMAL HIGH (ref 0–99)
NONHDL: 129.58
TRIGLYCERIDES: 91 mg/dL (ref 0.0–149.0)
VLDL: 18.2 mg/dL (ref 0.0–40.0)

## 2015-07-02 LAB — PSA: PSA: 0.65 ng/mL (ref 0.10–4.00)

## 2015-07-02 LAB — TESTOSTERONE: Testosterone: 373.08 ng/dL (ref 300.00–890.00)

## 2015-07-02 LAB — TSH: TSH: 1.22 u[IU]/mL (ref 0.35–4.50)

## 2015-07-06 ENCOUNTER — Encounter: Payer: Self-pay | Admitting: Internal Medicine

## 2015-07-06 ENCOUNTER — Ambulatory Visit (INDEPENDENT_AMBULATORY_CARE_PROVIDER_SITE_OTHER): Payer: Managed Care, Other (non HMO) | Admitting: Internal Medicine

## 2015-07-06 VITALS — BP 118/80 | HR 62 | Ht 69.0 in | Wt 240.0 lb

## 2015-07-06 DIAGNOSIS — N529 Male erectile dysfunction, unspecified: Secondary | ICD-10-CM

## 2015-07-06 DIAGNOSIS — Z0000 Encounter for general adult medical examination without abnormal findings: Secondary | ICD-10-CM

## 2015-07-06 MED ORDER — TADALAFIL 5 MG PO TABS: 5.0000 mg | ORAL_TABLET | Freq: Every day | ORAL | Status: DC

## 2015-07-06 NOTE — Assessment & Plan Note (Signed)
Cialis Rest more

## 2015-07-06 NOTE — Progress Notes (Signed)
Pre visit review using our clinic review tool, if applicable. No additional management support is needed unless otherwise documented below in the visit note. 

## 2015-07-06 NOTE — Progress Notes (Signed)
Subjective:  Patient ID: Eddie Dixon, male    DOB: 07/10/1965  Age: 50 y.o. MRN: [id number]  CC: Annual Exam   HPI Roylee Loffredo presents for a well exam  Outpatient Prescriptions Prior to Visit  Medication Sig Dispense Refill  . ARGININE PO Take by mouth daily.      . Cholecalciferol 1000 UNITS tablet Take 1,000 Units by mouth daily.      . tadalafil (CIALIS) 5 MG tablet Take 1 tablet (5 mg total) by mouth daily. 100 tablet 11   No facility-administered medications prior to visit.    ROS Review of Systems  Constitutional: Negative for appetite change, fatigue and unexpected weight change.  HENT: Negative for congestion, nosebleeds, sneezing, sore throat and trouble swallowing.   Eyes: Negative for itching and visual disturbance.  Respiratory: Negative for cough.   Cardiovascular: Negative for chest pain, palpitations and leg swelling.  Gastrointestinal: Negative for nausea, diarrhea, blood in stool and abdominal distention.  Genitourinary: Negative for frequency and hematuria.  Musculoskeletal: Negative for back pain, joint swelling, gait problem and neck pain.  Skin: Negative for rash.  Neurological: Negative for dizziness, tremors, speech difficulty and weakness.  Psychiatric/Behavioral: Negative for sleep disturbance, dysphoric mood and agitation. The patient is not nervous/anxious.     Objective:  BP 118/80 mmHg  Pulse 62  Ht 5\' 9"  (1.753 m)  Wt 240 lb (108.863 kg)  BMI 35.43 kg/m2  SpO2 97%  BP Readings from Last 3 Encounters:  07/06/15 118/80  07/03/14 130/98  06/24/13 120/72    Wt Readings from Last 3 Encounters:  07/06/15 240 lb (108.863 kg)  07/03/14 228 lb (103.42 kg)  06/24/13 249 lb (112.946 kg)    Physical Exam  Constitutional: He is oriented to person, place, and time. He appears well-developed. No distress.  NAD  HENT:  Mouth/Throat: Oropharynx is clear and moist.  Eyes: Conjunctivae are normal. Pupils are equal, round, and reactive to  light.  Neck: Normal range of motion. No JVD present. No thyromegaly present.  Cardiovascular: Normal rate, regular rhythm, normal heart sounds and intact distal pulses.  Exam reveals no gallop and no friction rub.   No murmur heard. Pulmonary/Chest: Effort normal and breath sounds normal. No respiratory distress. He has no wheezes. He has no rales. He exhibits no tenderness.  Abdominal: Soft. Bowel sounds are normal. He exhibits no distension and no mass. There is no tenderness. There is no rebound and no guarding.  Genitourinary: Rectum normal and prostate normal. Guaiac negative stool.  Musculoskeletal: Normal range of motion. He exhibits no edema or tenderness.  Lymphadenopathy:    He has no cervical adenopathy.  Neurological: He is alert and oriented to person, place, and time. He has normal reflexes. No cranial nerve deficit. He exhibits normal muscle tone. He displays a negative Romberg sign. Coordination and gait normal.  Skin: Skin is warm and dry. No rash noted.  Psychiatric: He has a normal mood and affect. His behavior is normal. Judgment and thought content normal.    Lab Results  Component Value Date   WBC 5.2 07/02/2015   HGB 15.5 07/02/2015   HCT 45.4 07/02/2015   PLT 219.0 07/02/2015   GLUCOSE 100* 07/02/2015   CHOL 191 07/02/2015   TRIG 91.0 07/02/2015   HDL 61.70 07/02/2015   LDLDIRECT 146.3 01/24/2008   LDLCALC 111* 07/02/2015   ALT 14 07/02/2015   AST 14 07/02/2015   NA 141 07/02/2015   K 4.7 07/02/2015   CL 103 07/02/2015  CREATININE 1.17 07/02/2015   BUN 15 07/02/2015   CO2 31 07/02/2015   TSH 1.22 07/02/2015   PSA 0.65 07/02/2015   HGBA1C 5.7 01/24/2008    Ct Abdomen Wo Contrast  02/23/2008  Clinical Data:  Right flank and lower quadrant pain.  Previous history of appendectomy.  Suspect ureteral calculus.  CT ABDOMEN AND PELVIS WITHOUT CONTRAST  Technique:  Multidetector CT imaging of the abdomen and pelvis was performed following the standard  protocol without intravenous contrast.  Comparison:  None  CT ABDOMEN  Findings:  Tiny intrarenal calculi are seen bilaterally.  Mild right hydronephrosis and ureterectasis is seen.  There is no evidence of left hydronephrosis.  The abdominal parenchymal organs are otherwise unremarkable in appearance.  There is no evidence of mass or adenopathy.  There is no evidence of inflammatory process or dilated bowel loops.  No abnormal fluid collections identified.  IMPRESSION:  1.  Mild right hydronephrosis and ureterectasis.  See pelvis CT report below. 2.  Tiny bilateral intrarenal calculi.  CT PELVIS  Findings:  Mild right ureteral dilatation is seen.  A calculus is seen in the distal right ureter measuring 3 x 4 mm.  There is no evidence of pelvic mass or inflammatory process.  There is no evidence of dilated bowel loops.  IMPRESSION: 3 x 4 mm distal right ureteral calculus.  Mild right ureterectasis and hydronephrosis. Provider: Molly Maduro May  Ct Pelvis Wo Contrast  02/23/2008  Clinical Data:  Right flank and lower quadrant pain.  Previous history of appendectomy.  Suspect ureteral calculus.  CT ABDOMEN AND PELVIS WITHOUT CONTRAST  Technique:  Multidetector CT imaging of the abdomen and pelvis was performed following the standard protocol without intravenous contrast.  Comparison:  None  CT ABDOMEN  Findings:  Tiny intrarenal calculi are seen bilaterally.  Mild right hydronephrosis and ureterectasis is seen.  There is no evidence of left hydronephrosis.  The abdominal parenchymal organs are otherwise unremarkable in appearance.  There is no evidence of mass or adenopathy.  There is no evidence of inflammatory process or dilated bowel loops.  No abnormal fluid collections identified.  IMPRESSION:  1.  Mild right hydronephrosis and ureterectasis.  See pelvis CT report below. 2.  Tiny bilateral intrarenal calculi.  CT PELVIS  Findings:  Mild right ureteral dilatation is seen.  A calculus is seen in the distal right  ureter measuring 3 x 4 mm.  There is no evidence of pelvic mass or inflammatory process.  There is no evidence of dilated bowel loops.  IMPRESSION: 3 x 4 mm distal right ureteral calculus.  Mild right ureterectasis and hydronephrosis. Provider: Molly Maduro May   Assessment & Plan:   There are no diagnoses linked to this encounter. I am having Mr. Tremaine maintain his ARGININE PO, Cholecalciferol, and tadalafil.  No orders of the defined types were placed in this encounter.     Follow-up: No Follow-up on file.  Sonda Primes, MD

## 2015-07-06 NOTE — Assessment & Plan Note (Signed)
We discussed age appropriate health related issues, including available/recomended screening tests and vaccinations. We discussed a need for adhering to healthy diet and exercise. Labs/EKG were reviewed/ordered. All questions were answered.   

## 2015-07-06 NOTE — Patient Instructions (Signed)

## 2015-10-01 ENCOUNTER — Other Ambulatory Visit: Payer: Self-pay | Admitting: Internal Medicine

## 2016-03-24 ENCOUNTER — Other Ambulatory Visit: Payer: Self-pay | Admitting: Internal Medicine

## 2016-03-24 ENCOUNTER — Telehealth: Payer: Self-pay | Admitting: Internal Medicine

## 2016-03-24 NOTE — Telephone Encounter (Signed)
Pt called in and needs a refill on his CIALIS 5 MG tablet [[phone]]   Fax number [phone]

## 2016-03-25 NOTE — Telephone Encounter (Signed)
Pt left msg on triage stating he is needing refill on his cialis sent to [facility]. Fax # [phone]. Verified chart pt is up to date on appts due back in June for CPX. Printed script fax to [facility]...Raechel Chute

## 2016-03-30 NOTE — Telephone Encounter (Signed)
OK to fill this/these prescription(s) with additional refills x5 Needs to have an OV every 12 months Thank you!

## 2016-03-31 MED ORDER — TADALAFIL 5 MG PO TABS: 5.0000 mg | ORAL_TABLET | Freq: Every day | ORAL | 1 refills | Status: DC

## 2016-03-31 NOTE — Telephone Encounter (Signed)
Printed and faxed

## 2016-04-22 ENCOUNTER — Other Ambulatory Visit: Payer: Self-pay | Admitting: Internal Medicine

## 2016-07-06 ENCOUNTER — Encounter: Payer: Self-pay | Admitting: Internal Medicine

## 2016-07-06 ENCOUNTER — Ambulatory Visit (INDEPENDENT_AMBULATORY_CARE_PROVIDER_SITE_OTHER): Payer: Managed Care, Other (non HMO) | Admitting: Internal Medicine

## 2016-07-06 DIAGNOSIS — N529 Male erectile dysfunction, unspecified: Secondary | ICD-10-CM | POA: Diagnosis not present

## 2016-07-06 DIAGNOSIS — Z0000 Encounter for general adult medical examination without abnormal findings: Secondary | ICD-10-CM

## 2016-07-06 NOTE — Assessment & Plan Note (Signed)
Cialis prn 

## 2016-07-06 NOTE — Progress Notes (Signed)
Subjective:  Patient ID: Eddie Dixon, male    DOB: [birth year]  Age: 51 y.o. MRN: [id number]  CC: No chief complaint on file.   HPI Arcangel Minion presents for a well exam  Outpatient Medications Prior to Visit  Medication Sig Dispense Refill  . ARGININE PO Take by mouth daily.      . Cholecalciferol 1000 UNITS tablet Take 1,000 Units by mouth daily.      . tadalafil (CIALIS) 5 MG tablet Take 1 tablet (5 mg total) by mouth daily. 90 tablet 1  . tadalafil (CIALIS) 5 MG tablet Take 1 tablet (5 mg total) by mouth daily. 100 tablet 11  . tadalafil (CIALIS) 5 MG tablet Take 1 tablet (5 mg total) by mouth daily. Yearly physical due in June must see MD for future refills 8 tablet 2   No facility-administered medications prior to visit.     ROS Review of Systems  Constitutional: Negative for appetite change, fatigue and unexpected weight change.  HENT: Negative for congestion, nosebleeds, sneezing, sore throat and trouble swallowing.   Eyes: Negative for itching and visual disturbance.  Respiratory: Negative for cough.   Cardiovascular: Negative for chest pain, palpitations and leg swelling.  Gastrointestinal: Negative for abdominal distention, blood in stool, diarrhea and nausea.  Genitourinary: Negative for frequency and hematuria.  Musculoskeletal: Negative for back pain, gait problem, joint swelling and neck pain.  Skin: Negative for rash.  Neurological: Negative for dizziness, tremors, speech difficulty and weakness.  Psychiatric/Behavioral: Negative for agitation, dysphoric mood and sleep disturbance. The patient is not nervous/anxious.     Objective:  BP 126/84 (BP Location: Left Arm, Patient Position: Sitting, Cuff Size: Large)   Pulse 60   Temp 98.3 F (36.8 C) (Oral)   Ht 5\' 9"  (1.753 m)   Wt 228 lb (103.4 kg)   SpO2 98%   BMI 33.67 kg/m   BP Readings from Last 3 Encounters:  07/06/16 126/84  07/06/15 118/80  07/03/14 (!) 130/98    Wt Readings from Last 3  Encounters:  07/06/16 228 lb (103.4 kg)  07/06/15 240 lb (108.9 kg)  07/03/14 228 lb (103.4 kg)    Physical Exam  Constitutional: He is oriented to person, place, and time. He appears well-developed. No distress.  NAD  HENT:  Mouth/Throat: Oropharynx is clear and moist.  Eyes: Conjunctivae are normal. Pupils are equal, round, and reactive to light.  Neck: Normal range of motion. No JVD present. No thyromegaly present.  Cardiovascular: Normal rate, regular rhythm, normal heart sounds and intact distal pulses.  Exam reveals no gallop and no friction rub.   No murmur heard. Pulmonary/Chest: Effort normal and breath sounds normal. No respiratory distress. He has no wheezes. He has no rales. He exhibits no tenderness.  Abdominal: Soft. Bowel sounds are normal. He exhibits no distension and no mass. There is no tenderness. There is no rebound and no guarding.  Musculoskeletal: Normal range of motion. He exhibits no edema or tenderness.  Lymphadenopathy:    He has no cervical adenopathy.  Neurological: He is alert and oriented to person, place, and time. He has normal reflexes. No cranial nerve deficit. He exhibits normal muscle tone. He displays a negative Romberg sign. Coordination and gait normal.  Skin: Skin is warm and dry. No rash noted.  Psychiatric: He has a normal mood and affect. His behavior is normal. Judgment and thought content normal.  Pt declined rectal/gen exam  Lab Results  Component Value Date   WBC 5.2 07/02/2015  HGB 15.5 07/02/2015   HCT 45.4 07/02/2015   PLT 219.0 07/02/2015   GLUCOSE 100 (H) 07/02/2015   CHOL 191 07/02/2015   TRIG 91.0 07/02/2015   HDL 61.70 07/02/2015   LDLDIRECT 146.3 01/24/2008   LDLCALC 111 (H) 07/02/2015   ALT 14 07/02/2015   AST 14 07/02/2015   NA 141 07/02/2015   K 4.7 07/02/2015   CL 103 07/02/2015   CREATININE 1.17 07/02/2015   BUN 15 07/02/2015   CO2 31 07/02/2015   TSH 1.22 07/02/2015   PSA 0.65 07/02/2015   HGBA1C 5.7  01/24/2008    Ct Abdomen Wo Contrast  Result Date: 02/23/2008 Clinical Data:  Right flank and lower quadrant pain.  Previous history of appendectomy.  Suspect ureteral calculus.  CT ABDOMEN AND PELVIS WITHOUT CONTRAST  Technique:  Multidetector CT imaging of the abdomen and pelvis was performed following the standard protocol without intravenous contrast.  Comparison:  None  CT ABDOMEN  Findings:  Tiny intrarenal calculi are seen bilaterally.  Mild right hydronephrosis and ureterectasis is seen.  There is no evidence of left hydronephrosis.  The abdominal parenchymal organs are otherwise unremarkable in appearance.  There is no evidence of mass or adenopathy.  There is no evidence of inflammatory process or dilated bowel loops.  No abnormal fluid collections identified.  IMPRESSION:  1.  Mild right hydronephrosis and ureterectasis.  See pelvis CT report below. 2.  Tiny bilateral intrarenal calculi.  CT PELVIS  Findings:  Mild right ureteral dilatation is seen.  A calculus is seen in the distal right ureter measuring 3 x 4 mm.  There is no evidence of pelvic mass or inflammatory process.  There is no evidence of dilated bowel loops.  IMPRESSION: 3 x 4 mm distal right ureteral calculus.  Mild right ureterectasis and hydronephrosis. Provider: Molly Maduro May  Ct Pelvis Wo Contrast  Result Date: 02/23/2008 Clinical Data:  Right flank and lower quadrant pain.  Previous history of appendectomy.  Suspect ureteral calculus.  CT ABDOMEN AND PELVIS WITHOUT CONTRAST  Technique:  Multidetector CT imaging of the abdomen and pelvis was performed following the standard protocol without intravenous contrast.  Comparison:  None  CT ABDOMEN  Findings:  Tiny intrarenal calculi are seen bilaterally.  Mild right hydronephrosis and ureterectasis is seen.  There is no evidence of left hydronephrosis.  The abdominal parenchymal organs are otherwise unremarkable in appearance.  There is no evidence of mass or adenopathy.  There is no  evidence of inflammatory process or dilated bowel loops.  No abnormal fluid collections identified.  IMPRESSION:  1.  Mild right hydronephrosis and ureterectasis.  See pelvis CT report below. 2.  Tiny bilateral intrarenal calculi.  CT PELVIS  Findings:  Mild right ureteral dilatation is seen.  A calculus is seen in the distal right ureter measuring 3 x 4 mm.  There is no evidence of pelvic mass or inflammatory process.  There is no evidence of dilated bowel loops.  IMPRESSION: 3 x 4 mm distal right ureteral calculus.  Mild right ureterectasis and hydronephrosis. Provider: Molly Maduro May   Assessment & Plan:   There are no diagnoses linked to this encounter. I am having Mr. Rigler maintain his ARGININE PO, Cholecalciferol, and tadalafil.  No orders of the defined types were placed in this encounter.    Follow-up: No Follow-up on file.  Sonda Primes, MD

## 2016-07-06 NOTE — Patient Instructions (Signed)
Wt Readings from Last 3 Encounters:  07/06/16 228 lb (103.4 kg)  07/06/15 240 lb (108.9 kg)  07/03/14 228 lb (103.4 kg)

## 2016-07-06 NOTE — Assessment & Plan Note (Signed)
We discussed age appropriate health related issues, including available/recomended screening tests and vaccinations. We discussed a need for adhering to healthy diet and exercise. Labs were ordered to be later reviewed . All questions were answered.   

## 2016-07-19 ENCOUNTER — Encounter: Payer: Managed Care, Other (non HMO) | Admitting: Internal Medicine

## 2016-09-19 ENCOUNTER — Telehealth: Payer: Self-pay | Admitting: Internal Medicine

## 2016-09-19 MED ORDER — TADALAFIL 5 MG PO TABS
5.0000 mg | ORAL_TABLET | Freq: Every day | ORAL | 0 refills | Status: DC
Start: 2016-09-19 — End: 2017-03-10

## 2016-09-19 NOTE — Telephone Encounter (Signed)
Pt called requesting a refill on his tadalafil (CIALIS) 5 MG tablet sent to [facility] on [address].

## 2016-09-19 NOTE — Telephone Encounter (Signed)
Reviewed chart pt is up-to-date sent refills to [facility]  

## 2016-09-22 ENCOUNTER — Telehealth: Payer: Self-pay

## 2016-09-22 NOTE — Telephone Encounter (Signed)
PA for cialis can not be done. PA will be denied due to no dx for BPH.   Can you call pt and inform that he can contact other pharmacies for a better price.

## 2016-09-22 NOTE — Telephone Encounter (Signed)
Informed patient.  Patient states he is use to paying out of pocket for this medication.

## 2017-03-10 ENCOUNTER — Telehealth: Payer: Self-pay | Admitting: Internal Medicine

## 2017-03-10 MED ORDER — TADALAFIL 5 MG PO TABS: 5.0000 mg | ORAL_TABLET | Freq: Every day | ORAL | 0 refills | Status: DC

## 2017-03-10 NOTE — Telephone Encounter (Signed)
Copied from CRM [id number]. Topic: Quick Communication - Rx Refill/Question >> Mar 10, 2017  1:19 PM Louie Bun, Rosey Bath D wrote: Medication:tadalafil (CIALIS) 5 MG tablet   Has the patient contacted their pharmacy? Yes   (Agent: If no, request that the patient contact the pharmacy for the refill.)   Preferred Pharmacy (with phone number or street name):[facility] [address], [place] - [address]   Agent: Please be advised that RX refills may take up to 3 business days. We ask that you follow-up with your pharmacy.

## 2017-06-12 ENCOUNTER — Encounter: Payer: Self-pay | Admitting: Internal Medicine

## 2017-06-12 ENCOUNTER — Other Ambulatory Visit: Payer: 59

## 2017-06-12 ENCOUNTER — Ambulatory Visit (INDEPENDENT_AMBULATORY_CARE_PROVIDER_SITE_OTHER): Payer: 59 | Admitting: Internal Medicine

## 2017-06-12 VITALS — BP 122/82 | HR 70 | Temp 98.7°F | Ht 69.0 in | Wt 225.0 lb

## 2017-06-12 DIAGNOSIS — Z202 Contact with and (suspected) exposure to infections with a predominantly sexual mode of transmission: Secondary | ICD-10-CM

## 2017-06-12 NOTE — Patient Instructions (Signed)
We will check you for STDs today.

## 2017-06-12 NOTE — Progress Notes (Signed)
   Subjective:    Patient ID: Eddie Dixon, male    DOB: 08/09/1965, 52 y.o.   MRN: [id number]  HPI The patient is a 52 YO man coming in for possible STD exposure. He has noticed his girlfriend of 3 years is changing her attitude recently. She is urinating differently than usual which has him concerned. Denies new partner. Denies sore, itching on his genitals.   Review of Systems  Constitutional: Negative.   HENT: Negative.   Eyes: Negative.   Respiratory: Negative for cough, chest tightness and shortness of breath.   Cardiovascular: Negative for chest pain, palpitations and leg swelling.  Gastrointestinal: Negative for abdominal distention, abdominal pain, constipation, diarrhea, nausea and vomiting.  Musculoskeletal: Negative.   Skin: Negative.   Neurological: Negative.   Psychiatric/Behavioral: Negative.       Objective:   Physical Exam  Constitutional: He is oriented to person, place, and time. He appears well-developed and well-nourished.  HENT:  Head: Normocephalic and atraumatic.  Eyes: EOM are normal.  Neck: Normal range of motion.  Cardiovascular: Normal rate and regular rhythm.  Pulmonary/Chest: Effort normal and breath sounds normal. No respiratory distress. He has no wheezes. He has no rales.  Abdominal: Soft. Bowel sounds are normal. He exhibits no distension. There is no tenderness. There is no rebound.  Musculoskeletal: He exhibits no edema.  Neurological: He is alert and oriented to person, place, and time. Coordination normal.  Skin: Skin is warm and dry.   Vitals:   06/12/17 1453  BP: 122/82  Pulse: 70  Temp: 98.7 F (37.1 C)  TempSrc: Oral  SpO2: 98%  Weight: 225 lb (102.1 kg)  Height: 5\' 9"  (1.753 m)      Assessment & Plan:

## 2017-06-13 ENCOUNTER — Encounter: Payer: Self-pay | Admitting: Internal Medicine

## 2017-06-13 DIAGNOSIS — Z202 Contact with and (suspected) exposure to infections with a predominantly sexual mode of transmission: Secondary | ICD-10-CM | POA: Insufficient documentation

## 2017-06-13 LAB — RPR: RPR: NONREACTIVE

## 2017-06-13 LAB — HIV ANTIBODY (ROUTINE TESTING W REFLEX): HIV 1&2 Ab, 4th Generation: NONREACTIVE

## 2017-06-13 NOTE — Assessment & Plan Note (Signed)
Checking HIV, RPR, GC/chlamydia. Treat as appropriate. We talked about STD prevention.

## 2017-06-14 LAB — GC/CHLAMYDIA PROBE AMP
CHLAMYDIA, DNA PROBE: NEGATIVE
Neisseria gonorrhoeae by PCR: NEGATIVE

## 2017-07-18 ENCOUNTER — Ambulatory Visit (INDEPENDENT_AMBULATORY_CARE_PROVIDER_SITE_OTHER): Payer: 59 | Admitting: Internal Medicine

## 2017-07-18 ENCOUNTER — Encounter: Payer: Self-pay | Admitting: Internal Medicine

## 2017-07-18 DIAGNOSIS — Z0000 Encounter for general adult medical examination without abnormal findings: Secondary | ICD-10-CM | POA: Diagnosis not present

## 2017-07-18 MED ORDER — TADALAFIL 5 MG PO TABS: 5.0000 mg | ORAL_TABLET | Freq: Every day | ORAL | 3 refills | Status: DC

## 2017-07-18 NOTE — Assessment & Plan Note (Signed)
We discussed age appropriate health related issues, including available/recomended screening tests and vaccinations. We discussed a need for adhering to healthy diet and exercise. Labs were ordered to be later reviewed . All questions were answered.   

## 2017-07-18 NOTE — Progress Notes (Signed)
Subjective:  Patient ID: Eddie Dixon, male    DOB: 1965-11-15  Age: 52 y.o. MRN: [id number]  CC: No chief complaint on file.   HPI Dhaval Radcliff presents for a well exam  Outpatient Medications Prior to Visit  Medication Sig Dispense Refill  . ARGININE PO Take by mouth daily.      . Cholecalciferol 1000 UNITS tablet Take 1,000 Units by mouth daily.      . tadalafil (CIALIS) 5 MG tablet Take 1 tablet (5 mg total) by mouth daily. 90 tablet 0   No facility-administered medications prior to visit.     ROS: Review of Systems  Constitutional: Negative for appetite change, fatigue and unexpected weight change.  HENT: Negative for congestion, nosebleeds, sneezing, sore throat and trouble swallowing.   Eyes: Negative for itching and visual disturbance.  Respiratory: Negative for cough.   Cardiovascular: Negative for chest pain, palpitations and leg swelling.  Gastrointestinal: Negative for abdominal distention, blood in stool, diarrhea and nausea.  Genitourinary: Negative for decreased urine volume, frequency, hematuria and testicular pain.  Musculoskeletal: Negative for back pain, gait problem, joint swelling and neck pain.  Skin: Negative for rash.  Neurological: Negative for dizziness, tremors, speech difficulty and weakness.  Psychiatric/Behavioral: Negative for agitation, dysphoric mood, sleep disturbance and suicidal ideas. The patient is not nervous/anxious.     Objective:  BP 126/78 (BP Location: Left Arm, Patient Position: Sitting, Cuff Size: Large)   Pulse (!) 47   Temp 98.3 F (36.8 C) (Oral)   Ht 5\' 9"  (1.753 m)   Wt 227 lb (103 kg)   SpO2 98%   BMI 33.52 kg/m   BP Readings from Last 3 Encounters:  07/18/17 126/78  06/12/17 122/82  07/06/16 126/84    Wt Readings from Last 3 Encounters:  07/18/17 227 lb (103 kg)  06/12/17 225 lb (102.1 kg)  07/06/16 228 lb (103.4 kg)    Physical Exam  Constitutional: He is oriented to person, place, and time. He  appears well-developed. No distress.  NAD  HENT:  Mouth/Throat: Oropharynx is clear and moist.  Eyes: Pupils are equal, round, and reactive to light. Conjunctivae are normal.  Neck: Normal range of motion. No JVD present. No thyromegaly present.  Cardiovascular: Normal rate, regular rhythm, normal heart sounds and intact distal pulses. Exam reveals no gallop and no friction rub.  No murmur heard. Pulmonary/Chest: Effort normal and breath sounds normal. No respiratory distress. He has no wheezes. He has no rales. He exhibits no tenderness.  Abdominal: Soft. Bowel sounds are normal. He exhibits no distension and no mass. There is no tenderness. There is no rebound and no guarding.  Musculoskeletal: Normal range of motion. He exhibits no edema or tenderness.  Lymphadenopathy:    He has no cervical adenopathy.  Neurological: He is alert and oriented to person, place, and time. He has normal reflexes. No cranial nerve deficit. He exhibits normal muscle tone. He displays a negative Romberg sign. Coordination and gait normal.  Skin: Skin is warm and dry. No rash noted.  Psychiatric: He has a normal mood and affect. His behavior is normal. Judgment and thought content normal.   Testes - self exam nl   Lab Results  Component Value Date   WBC 5.2 07/02/2015   HGB 15.5 07/02/2015   HCT 45.4 07/02/2015   PLT 219.0 07/02/2015   GLUCOSE 100 (H) 07/02/2015   CHOL 191 07/02/2015   TRIG 91.0 07/02/2015   HDL 61.70 07/02/2015   LDLDIRECT 146.3 01/24/2008  LDLCALC 111 (H) 07/02/2015   ALT 14 07/02/2015   AST 14 07/02/2015   NA 141 07/02/2015   K 4.7 07/02/2015   CL 103 07/02/2015   CREATININE 1.17 07/02/2015   BUN 15 07/02/2015   CO2 31 07/02/2015   TSH 1.22 07/02/2015   PSA 0.65 07/02/2015   HGBA1C 5.7 01/24/2008    Ct Abdomen Wo Contrast  Result Date: 02/23/2008 Clinical Data:  Right flank and lower quadrant pain.  Previous history of appendectomy.  Suspect ureteral calculus.  CT  ABDOMEN AND PELVIS WITHOUT CONTRAST  Technique:  Multidetector CT imaging of the abdomen and pelvis was performed following the standard protocol without intravenous contrast.  Comparison:  None  CT ABDOMEN  Findings:  Tiny intrarenal calculi are seen bilaterally.  Mild right hydronephrosis and ureterectasis is seen.  There is no evidence of left hydronephrosis.  The abdominal parenchymal organs are otherwise unremarkable in appearance.  There is no evidence of mass or adenopathy.  There is no evidence of inflammatory process or dilated bowel loops.  No abnormal fluid collections identified.  IMPRESSION:  1.  Mild right hydronephrosis and ureterectasis.  See pelvis CT report below. 2.  Tiny bilateral intrarenal calculi.  CT PELVIS  Findings:  Mild right ureteral dilatation is seen.  A calculus is seen in the distal right ureter measuring 3 x 4 mm.  There is no evidence of pelvic mass or inflammatory process.  There is no evidence of dilated bowel loops.  IMPRESSION: 3 x 4 mm distal right ureteral calculus.  Mild right ureterectasis and hydronephrosis. Provider: Molly Maduro May  Ct Pelvis Wo Contrast  Result Date: 02/23/2008 Clinical Data:  Right flank and lower quadrant pain.  Previous history of appendectomy.  Suspect ureteral calculus.  CT ABDOMEN AND PELVIS WITHOUT CONTRAST  Technique:  Multidetector CT imaging of the abdomen and pelvis was performed following the standard protocol without intravenous contrast.  Comparison:  None  CT ABDOMEN  Findings:  Tiny intrarenal calculi are seen bilaterally.  Mild right hydronephrosis and ureterectasis is seen.  There is no evidence of left hydronephrosis.  The abdominal parenchymal organs are otherwise unremarkable in appearance.  There is no evidence of mass or adenopathy.  There is no evidence of inflammatory process or dilated bowel loops.  No abnormal fluid collections identified.  IMPRESSION:  1.  Mild right hydronephrosis and ureterectasis.  See pelvis CT report  below. 2.  Tiny bilateral intrarenal calculi.  CT PELVIS  Findings:  Mild right ureteral dilatation is seen.  A calculus is seen in the distal right ureter measuring 3 x 4 mm.  There is no evidence of pelvic mass or inflammatory process.  There is no evidence of dilated bowel loops.  IMPRESSION: 3 x 4 mm distal right ureteral calculus.  Mild right ureterectasis and hydronephrosis. Provider: Molly Maduro May   Assessment & Plan:   There are no diagnoses linked to this encounter.   No orders of the defined types were placed in this encounter.    Follow-up: No follow-ups on file.  Sonda Primes, MD

## 2018-01-09 ENCOUNTER — Ambulatory Visit: Payer: Self-pay | Admitting: *Deleted

## 2018-01-09 NOTE — Telephone Encounter (Signed)
Message from Tamela Oddi sent at 01/09/2018 1:40 PM EST   Patient called because he is experiencing congestion and drainage which is making him cough a lot. He doesn't want an appointment because he is not feeling very sick, but would like something to dry his sinuses so he will not cough so much. Please advise and call patient back at [phone]   Pt advised on otc medications for sinus drainage. Advised Claritin, or allegra. Using a nasal spray to help clean out sinuses and drying some. He stated that he is not sick, just having sinus drainage that is making him cough.  Also advised to speak with his pharmacy regarding other medications that he can take over the counter. Pt voiced understanding.  Reason for Disposition . General information question, no triage required and triager able to answer question  Protocols used: INFORMATION ONLY CALL-A-AH

## 2018-01-09 NOTE — Telephone Encounter (Signed)
FYI

## 2018-01-09 NOTE — Telephone Encounter (Signed)
You can use over-the-counter  "cold" medicines  such as "Tylenol cold" , "Advil cold",  "Mucinex" or" Mucinex D"  for cough and congestion.   Use "Afrin" nasal spray for nasal congestion as directed. Use " Delsym" or" Robitussin" cough syrup varietis for cough.  You can use plain "Tylenol" or "Advil" for fever, chills and achyness. Use Halls or Ricola cough drops.   Please, make an appointment if you are not better or if you're worse.

## 2018-09-27 ENCOUNTER — Other Ambulatory Visit: Payer: Self-pay | Admitting: Internal Medicine

## 2018-09-27 MED ORDER — TADALAFIL 5 MG PO TABS: 5.0000 mg | ORAL_TABLET | Freq: Every day | ORAL | 0 refills | Status: DC

## 2018-09-27 NOTE — Telephone Encounter (Signed)
Medication Refill - Medication: tadalafil (CIALIS) 5 MG tablet  Has the patient contacted their pharmacy? Yes - no refills left (Agent: If no, request that the patient contact the pharmacy for the refill.) (Agent: If yes, when and what did the pharmacy advise?)  Preferred Pharmacy (with phone number or street name):  Pt is traveling and needs this sent to the [facility] in [place]. Fax number: [phone] Phone number: [phone]  Agent: Please be advised that RX refills may take up to 3 business days. We ask that you follow-up with your pharmacy.

## 2018-09-27 NOTE — Telephone Encounter (Signed)
Requested medication (s) are due for refill today:yes  Requested medication (s) are on the active medication list: yes  Last refill:    Future visit scheduled:  Notes to clinic: Review for refill   Requested Prescriptions  Pending Prescriptions Disp Refills   tadalafil (CIALIS) 5 MG tablet 90 tablet 3    Sig: Take 1 tablet (5 mg total) by mouth daily.     Urology: Erectile Dysfunction Agents Failed - 09/27/2018 10:30 AM      Failed - Valid encounter within last 12 months    Recent Outpatient Visits          1 year ago Well adult exam   [facility], Evie Lacks, MD   1 year ago Exposure to STD   [facility] -Chuck Hint, MD   2 years ago Well adult exam   [facility] [facility], Evie Lacks, MD   3 years ago Well adult exam   [facility], Evie Lacks, MD   4 years ago Well adult exam   [place] [facility], Evie Lacks, MD             Passed - Last BP in normal range    BP Readings from Last 1 Encounters:  07/18/17 126/78

## 2018-12-18 ENCOUNTER — Other Ambulatory Visit: Payer: Self-pay | Admitting: Internal Medicine

## 2018-12-19 ENCOUNTER — Encounter: Payer: Self-pay | Admitting: Internal Medicine

## 2018-12-19 ENCOUNTER — Ambulatory Visit (INDEPENDENT_AMBULATORY_CARE_PROVIDER_SITE_OTHER): Payer: Self-pay | Admitting: Internal Medicine

## 2018-12-19 DIAGNOSIS — N529 Male erectile dysfunction, unspecified: Secondary | ICD-10-CM

## 2018-12-19 MED ORDER — TADALAFIL 5 MG PO TABS: 5.0000 mg | ORAL_TABLET | Freq: Every day | ORAL | 3 refills | Status: DC

## 2018-12-19 NOTE — Progress Notes (Signed)
Virtual Visit via Video Note  I connected with [name] on 12/19/18 at  9:10 AM EST by a video enabled telemedicine application and verified that I am speaking with the correct person using two identifiers.   I discussed the limitations of evaluation and management by telemedicine and the availability of in person appointments. The patient expressed understanding and agreed to proceed.  History of Present Illness: We need to follow-up on ED  There has been no runny nose, cough, chest pain, shortness of breath, abdominal pain, diarrhea, constipation, arthralgias, skin rashes.   Observations/Objective: The patient appears to be in no acute distress, looks well.  Assessment and Plan:  See my Assessment and Plan. Follow Up Instructions:    I discussed the assessment and treatment plan with the patient. The patient was provided an opportunity to ask questions and all were answered. The patient agreed with the plan and demonstrated an understanding of the instructions.   The patient was advised to call back or seek an in-person evaluation if the symptoms worsen or if the condition fails to improve as anticipated.  I provided face-to-face time during this encounter. We were at different locations.   Walker Kehr, MD

## 2018-12-19 NOTE — Assessment & Plan Note (Signed)
Cialis renewed.

## 2020-01-19 ENCOUNTER — Other Ambulatory Visit: Payer: Self-pay | Admitting: Internal Medicine

## 2020-03-15 ENCOUNTER — Other Ambulatory Visit: Payer: Self-pay | Admitting: Internal Medicine

## 2020-03-19 ENCOUNTER — Other Ambulatory Visit: Payer: Self-pay | Admitting: Internal Medicine

## 2020-03-19 NOTE — Addendum Note (Signed)
Addended by: Deatra James on: 03/19/2020 04:26 PM   Modules accepted: Orders

## 2020-06-16 ENCOUNTER — Encounter: Payer: Self-pay | Admitting: Internal Medicine

## 2020-11-02 ENCOUNTER — Other Ambulatory Visit: Payer: Self-pay | Admitting: Internal Medicine

## 2020-11-03 ENCOUNTER — Other Ambulatory Visit: Payer: Self-pay | Admitting: Internal Medicine

## 2020-11-16 ENCOUNTER — Encounter: Payer: Self-pay | Admitting: Internal Medicine

## 2020-11-16 ENCOUNTER — Telehealth (INDEPENDENT_AMBULATORY_CARE_PROVIDER_SITE_OTHER): Payer: Self-pay | Admitting: Internal Medicine

## 2020-11-16 DIAGNOSIS — Z0000 Encounter for general adult medical examination without abnormal findings: Secondary | ICD-10-CM

## 2020-11-16 DIAGNOSIS — G4700 Insomnia, unspecified: Secondary | ICD-10-CM | POA: Insufficient documentation

## 2020-11-16 DIAGNOSIS — N529 Male erectile dysfunction, unspecified: Secondary | ICD-10-CM

## 2020-11-16 DIAGNOSIS — F5101 Primary insomnia: Secondary | ICD-10-CM

## 2020-11-16 MED ORDER — TADALAFIL 5 MG PO TABS: 5.0000 mg | ORAL_TABLET | Freq: Every day | ORAL | 3 refills | Status: DC

## 2020-11-16 MED ORDER — TRAZODONE HCL 50 MG PO TABS: 25.0000 mg | ORAL_TABLET | Freq: Every evening | ORAL | 5 refills | Status: DC | PRN

## 2020-11-16 NOTE — Progress Notes (Signed)
Virtual Visit via Video Note  I connected with Eddie Dixon on 11/16/20 at 10:40 AM EDT by a video enabled telemedicine application and verified that I am speaking with the correct person using two identifiers.   I discussed the limitations of evaluation and management by telemedicine and the availability of in person appointments. The patient expressed understanding and agreed to proceed.  I was located at our [facility] office. The patient was at home. There was no one else present in the visit.  No chief complaint on file.    History of Present Illness:  F/u BPH sx's C/o isomnia - sleeping 2-3 h /night lately due to stress possibly  Review of Systems  Psychiatric/Behavioral:  Negative for memory loss, substance abuse and suicidal ideas. The patient has insomnia. The patient is not nervous/anxious.   Fatigued  Observations/Objective: The patient appears to be in no acute distress  Assessment and Plan:  Problem List Items Addressed This Visit     ERECTILE DYSFUNCTION    Cialis daily      Relevant Orders   Testosterone   Insomnia    New.  Reduce stress. Trazodone Rx at hs prn      Well adult exam - Primary   Relevant Orders   TSH   Urinalysis   CBC with Differential/Platelet   Lipid panel   Comprehensive metabolic panel   PSA     Meds ordered this encounter  Medications   tadalafil (CIALIS) 5 MG tablet    Sig: Take 1 tablet (5 mg total) by mouth daily.    Dispense:  90 tablet    Refill:  3   traZODone (DESYREL) 50 MG tablet    Sig: Take 0.5-1 tablets (25-50 mg total) by mouth at bedtime as needed for sleep.    Dispense:  30 tablet    Refill:  5     Follow Up Instructions:    I discussed the assessment and treatment plan with the patient. The patient was provided an opportunity to ask questions and all were answered. The patient agreed with the plan and demonstrated an understanding of the instructions.   The patient was advised to call back or  seek an in-person evaluation if the symptoms worsen or if the condition fails to improve as anticipated.  I provided face-to-face time during this encounter. We were at different locations.   Sonda Primes, MD

## 2020-11-16 NOTE — Assessment & Plan Note (Signed)
New.  Reduce stress. Trazodone Rx at hs prn

## 2020-11-16 NOTE — Assessment & Plan Note (Signed)
Cialis daily 

## 2021-01-19 ENCOUNTER — Other Ambulatory Visit (INDEPENDENT_AMBULATORY_CARE_PROVIDER_SITE_OTHER): Payer: Self-pay

## 2021-01-19 DIAGNOSIS — Z0000 Encounter for general adult medical examination without abnormal findings: Secondary | ICD-10-CM | POA: Diagnosis not present

## 2021-01-19 DIAGNOSIS — N529 Male erectile dysfunction, unspecified: Secondary | ICD-10-CM

## 2021-01-19 LAB — CBC WITH DIFFERENTIAL/PLATELET
Basophils Absolute: 0 10*3/uL (ref 0.0–0.1)
Basophils Relative: 0.5 % (ref 0.0–3.0)
Eosinophils Absolute: 0.2 10*3/uL (ref 0.0–0.7)
Eosinophils Relative: 5.4 % — ABNORMAL HIGH (ref 0.0–5.0)
HCT: 45 % (ref 39.0–52.0)
Hemoglobin: 14.9 g/dL (ref 13.0–17.0)
Lymphocytes Relative: 25.7 % (ref 12.0–46.0)
Lymphs Abs: 1 10*3/uL (ref 0.7–4.0)
MCHC: 33.1 g/dL (ref 30.0–36.0)
MCV: 93.1 fl (ref 78.0–100.0)
Monocytes Absolute: 0.7 10*3/uL (ref 0.1–1.0)
Monocytes Relative: 16.5 % — ABNORMAL HIGH (ref 3.0–12.0)
Neutro Abs: 2.1 10*3/uL (ref 1.4–7.7)
Neutrophils Relative %: 51.9 % (ref 43.0–77.0)
Platelets: 239 10*3/uL (ref 150.0–400.0)
RBC: 4.84 Mil/uL (ref 4.22–5.81)
RDW: 12.6 % (ref 11.5–15.5)
WBC: 4.1 10*3/uL (ref 4.0–10.5)

## 2021-01-19 LAB — URINALYSIS, ROUTINE W REFLEX MICROSCOPIC
Hgb urine dipstick: NEGATIVE
Nitrite: NEGATIVE
Specific Gravity, Urine: 1.025 (ref 1.000–1.030)
Total Protein, Urine: 30 — AB
Urine Glucose: NEGATIVE
Urobilinogen, UA: 0.2 (ref 0.0–1.0)
pH: 5.5 (ref 5.0–8.0)

## 2021-01-19 LAB — COMPREHENSIVE METABOLIC PANEL
ALT: 30 U/L (ref 0–53)
AST: 23 U/L (ref 0–37)
Albumin: 3.9 g/dL (ref 3.5–5.2)
Alkaline Phosphatase: 40 U/L (ref 39–117)
BUN: 18 mg/dL (ref 6–23)
CO2: 25 mEq/L (ref 19–32)
Calcium: 9.3 mg/dL (ref 8.4–10.5)
Chloride: 104 mEq/L (ref 96–112)
Creatinine, Ser: 1.27 mg/dL (ref 0.40–1.50)
GFR: 64.72 mL/min (ref >60.00)
Glucose, Bld: 113 mg/dL — ABNORMAL HIGH (ref 70–99)
Potassium: 4.4 mEq/L (ref 3.5–5.1)
Sodium: 139 mEq/L (ref 135–145)
Total Bilirubin: 1 mg/dL (ref 0.2–1.2)
Total Protein: 5.9 g/dL — ABNORMAL LOW (ref 6.0–8.3)

## 2021-01-19 LAB — LIPID PANEL
Cholesterol: 159 mg/dL (ref 0–200)
HDL: 58.8 mg/dL (ref >39.00)
LDL Cholesterol: 83 mg/dL (ref 0–99)
NonHDL: 99.9
Total CHOL/HDL Ratio: 3
Triglycerides: 83 mg/dL (ref 0.0–149.0)
VLDL: 16.6 mg/dL (ref 0.0–40.0)

## 2021-01-19 LAB — TSH: TSH: 2.8 u[IU]/mL (ref 0.35–5.50)

## 2021-01-19 LAB — PSA: PSA: 0.57 ng/mL (ref 0.10–4.00)

## 2021-01-19 LAB — TESTOSTERONE: Testosterone: 389.99 ng/dL (ref 300.00–890.00)

## 2021-01-27 ENCOUNTER — Telehealth: Payer: Self-pay

## 2021-01-27 ENCOUNTER — Other Ambulatory Visit: Payer: Self-pay

## 2021-01-27 ENCOUNTER — Encounter: Payer: Self-pay | Admitting: Internal Medicine

## 2021-01-27 ENCOUNTER — Ambulatory Visit (INDEPENDENT_AMBULATORY_CARE_PROVIDER_SITE_OTHER): Payer: 59 | Admitting: Internal Medicine

## 2021-01-27 VITALS — BP 130/92 | HR 113 | Temp 98.4°F | Ht 69.0 in | Wt 239.6 lb

## 2021-01-27 DIAGNOSIS — Z8249 Family history of ischemic heart disease and other diseases of the circulatory system: Secondary | ICD-10-CM

## 2021-01-27 DIAGNOSIS — R202 Paresthesia of skin: Secondary | ICD-10-CM | POA: Diagnosis not present

## 2021-01-27 DIAGNOSIS — R000 Tachycardia, unspecified: Secondary | ICD-10-CM

## 2021-01-27 DIAGNOSIS — I4819 Other persistent atrial fibrillation: Secondary | ICD-10-CM

## 2021-01-27 DIAGNOSIS — N390 Urinary tract infection, site not specified: Secondary | ICD-10-CM

## 2021-01-27 DIAGNOSIS — I4891 Unspecified atrial fibrillation: Secondary | ICD-10-CM | POA: Insufficient documentation

## 2021-01-27 DIAGNOSIS — F5101 Primary insomnia: Secondary | ICD-10-CM

## 2021-01-27 DIAGNOSIS — Z0000 Encounter for general adult medical examination without abnormal findings: Secondary | ICD-10-CM | POA: Diagnosis not present

## 2021-01-27 MED ORDER — CEFUROXIME AXETIL 250 MG PO TABS: 250.0000 mg | ORAL_TABLET | Freq: Two times a day (BID) | ORAL | 0 refills | Status: AC

## 2021-01-27 MED ORDER — RIVAROXABAN 20 MG PO TABS: 20.0000 mg | ORAL_TABLET | Freq: Every day | ORAL | 11 refills | Status: DC

## 2021-01-27 MED ORDER — METOPROLOL SUCCINATE ER 25 MG PO TB24: 25.0000 mg | ORAL_TABLET | Freq: Every day | ORAL | 11 refills | Status: DC

## 2021-01-27 MED ORDER — VITAMIN D3 50 MCG (2000 UT) PO CAPS: 2000.0000 [IU] | ORAL_CAPSULE | Freq: Every day | ORAL | 3 refills | Status: AC

## 2021-01-27 NOTE — Assessment & Plan Note (Addendum)
Coronary calcium CT was offered F died at 65 Cardiology consult

## 2021-01-27 NOTE — Progress Notes (Addendum)
Subjective:  Patient ID: Eddie Dixon, male    DOB: 11-Jan-1966  Age: 56 y.o. MRN: [id number]  CC: Annual Exam and Insomnia (Pt states he want to take w/ MD about sleeping at night)   HPI Eddie Dixon presents for a well exam C/o numbness L hand numbness C/o depressed mood C/o SOB/DOE 3 mo ago  Outpatient Medications Prior to Visit  Medication Sig Dispense Refill   ARGININE PO Take by mouth daily.       tadalafil (CIALIS) 5 MG tablet Take 1 tablet (5 mg total) by mouth daily. 90 tablet 3   traZODone (DESYREL) 50 MG tablet Take 0.5-1 tablets (25-50 mg total) by mouth at bedtime as needed for sleep. 30 tablet 5   Cholecalciferol 1000 UNITS tablet Take 1,000 Units by mouth daily.       No facility-administered medications prior to visit.    ROS: Review of Systems  Constitutional:  Positive for fatigue. Negative for appetite change and unexpected weight change.  HENT:  Negative for congestion, nosebleeds, sneezing, sore throat and trouble swallowing.   Eyes:  Negative for itching and visual disturbance.  Respiratory:  Positive for shortness of breath. Negative for cough, chest tightness and wheezing.   Cardiovascular:  Positive for palpitations. Negative for chest pain and leg swelling.  Gastrointestinal:  Negative for abdominal distention, blood in stool, diarrhea and nausea.  Genitourinary:  Negative for frequency and hematuria.  Musculoskeletal:  Negative for back pain, gait problem, joint swelling and neck pain.  Skin:  Negative for rash.  Neurological:  Positive for numbness. Negative for dizziness, tremors, syncope, speech difficulty, weakness and light-headedness.  Psychiatric/Behavioral:  Negative for agitation, dysphoric mood and sleep disturbance. The patient is not nervous/anxious.    Objective:  BP (!) 130/92 (BP Location: Left Arm)    Pulse (!) 113    Temp 98.4 F (36.9 C) (Oral)    Ht 5\' 9"  (1.753 m)    Wt 239 lb 9.6 oz (108.7 kg)    SpO2 98%    BMI 35.38 kg/m    BP Readings from Last 3 Encounters:  01/27/21 (!) 130/92  07/18/17 126/78  06/12/17 122/82    Wt Readings from Last 3 Encounters:  01/27/21 239 lb 9.6 oz (108.7 kg)  07/18/17 227 lb (103 kg)  06/12/17 225 lb (102.1 kg)    Physical Exam Constitutional:      General: He is not in acute distress.    Appearance: He is well-developed.     Comments: NAD  Eyes:     Conjunctiva/sclera: Conjunctivae normal.     Pupils: Pupils are equal, round, and reactive to light.  Neck:     Thyroid: No thyromegaly.     Vascular: No JVD.  Cardiovascular:     Rate and Rhythm: Regular rhythm. Tachycardia present.     Heart sounds: Normal heart sounds. No murmur heard.   No friction rub. No gallop.  Pulmonary:     Effort: Pulmonary effort is normal. No respiratory distress.     Breath sounds: Normal breath sounds. No wheezing or rales.  Chest:     Chest wall: No tenderness.  Abdominal:     General: Bowel sounds are normal. There is no distension.     Palpations: Abdomen is soft. There is no mass.     Tenderness: There is no abdominal tenderness. There is no guarding or rebound.  Musculoskeletal:        General: No tenderness. Normal range of motion.  Cervical back: Normal range of motion.  Lymphadenopathy:     Cervical: No cervical adenopathy.  Skin:    General: Skin is warm and dry.     Findings: No rash.  Neurological:     Mental Status: He is alert and oriented to person, place, and time.     Cranial Nerves: No cranial nerve deficit.     Motor: No abnormal muscle tone.     Coordination: Coordination normal.     Gait: Gait normal.     Deep Tendon Reflexes: Reflexes are normal and symmetric.  Psychiatric:        Behavior: Behavior normal.        Thought Content: Thought content normal.        Judgment: Judgment normal.  L hand grip OK CTS signs (-) B   Procedure: EKG Indication: tachycardia Impression: A fib/A flutter w/RVR. HR 138.    I spent 22 minutes in addition to  time for CPX wellness examination in preparing to see the patient by review of recent labs, imaging and procedures, obtaining and reviewing separately obtained history, communicating with the patient, ordering medications, tests or procedures, and documenting clinical information in the EHR including the differential diagnosis, treatment, and any further evaluation and other management of a new A fib, anticoagulation          Lab Results  Component Value Date   WBC 4.1 01/19/2021   HGB 14.9 01/19/2021   HCT 45.0 01/19/2021   PLT 239.0 01/19/2021   GLUCOSE 113 (H) 01/19/2021   CHOL 159 01/19/2021   TRIG 83.0 01/19/2021   HDL 58.80 01/19/2021   LDLDIRECT 146.3 01/24/2008   LDLCALC 83 01/19/2021   ALT 30 01/19/2021   AST 23 01/19/2021   NA 139 01/19/2021   K 4.4 01/19/2021   CL 104 01/19/2021   CREATININE 1.27 01/19/2021   BUN 18 01/19/2021   CO2 25 01/19/2021   TSH 2.80 01/19/2021   PSA 0.57 01/19/2021   HGBA1C 5.7 01/24/2008    CT Abdomen Wo Contrast  Result Date: 02/23/2008 Clinical Data:  Right flank and lower quadrant pain.  Previous history of appendectomy.  Suspect ureteral calculus.  CT ABDOMEN AND PELVIS WITHOUT CONTRAST  Technique:  Multidetector CT imaging of the abdomen and pelvis was performed following the standard protocol without intravenous contrast.  Comparison:  None  CT ABDOMEN  Findings:  Tiny intrarenal calculi are seen bilaterally.  Mild right hydronephrosis and ureterectasis is seen.  There is no evidence of left hydronephrosis.  The abdominal parenchymal organs are otherwise unremarkable in appearance.  There is no evidence of mass or adenopathy.  There is no evidence of inflammatory process or dilated bowel loops.  No abnormal fluid collections identified.  IMPRESSION:  1.  Mild right hydronephrosis and ureterectasis.  See pelvis CT report below. 2.  Tiny bilateral intrarenal calculi.  CT PELVIS  Findings:  Mild right ureteral dilatation is seen.  A calculus  is seen in the distal right ureter measuring 3 x 4 mm.  There is no evidence of pelvic mass or inflammatory process.  There is no evidence of dilated bowel loops.  IMPRESSION: 3 x 4 mm distal right ureteral calculus.  Mild right ureterectasis and hydronephrosis. Provider: Herbie Baltimore May  CT Pelvis Wo Contrast  Result Date: 02/23/2008 Clinical Data:  Right flank and lower quadrant pain.  Previous history of appendectomy.  Suspect ureteral calculus.  CT ABDOMEN AND PELVIS WITHOUT CONTRAST  Technique:  Multidetector CT imaging of the  abdomen and pelvis was performed following the standard protocol without intravenous contrast.  Comparison:  None  CT ABDOMEN  Findings:  Tiny intrarenal calculi are seen bilaterally.  Mild right hydronephrosis and ureterectasis is seen.  There is no evidence of left hydronephrosis.  The abdominal parenchymal organs are otherwise unremarkable in appearance.  There is no evidence of mass or adenopathy.  There is no evidence of inflammatory process or dilated bowel loops.  No abnormal fluid collections identified.  IMPRESSION:  1.  Mild right hydronephrosis and ureterectasis.  See pelvis CT report below. 2.  Tiny bilateral intrarenal calculi.  CT PELVIS  Findings:  Mild right ureteral dilatation is seen.  A calculus is seen in the distal right ureter measuring 3 x 4 mm.  There is no evidence of pelvic mass or inflammatory process.  There is no evidence of dilated bowel loops.  IMPRESSION: 3 x 4 mm distal right ureteral calculus.  Mild right ureterectasis and hydronephrosis. Provider: Herbie Baltimore May   Assessment & Plan:   Problem List Items Addressed This Visit     Atrial fibrillation and flutter ([place])    New. EKG with A fib/A flutter w/RVR. HR 138.    Start Toprol, Xarelto Card ref - pls see ASAP      Relevant Medications   metoprolol succinate (TOPROL-XL) 25 MG 24 hr tablet   rivaroxaban (XARELTO) 20 MG TABS tablet   Family history of early CAD    Coronary calcium CT was  offered F died at 40 Cardiology consult      Insomnia    Reduce stress. Trazodone Rx at hs prn      Paresthesia    L hand - probable CTS, work related Other diff dx's to be considered too... Use a splint      Urinary tract infection    Ceftin po      Relevant Medications   cefUROXime (CEFTIN) 250 MG tablet   Well adult exam     We discussed age appropriate health related issues, including available/recomended screening tests and vaccinations. Labs were ordered to be later reviewed . All questions were answered. We discussed one or more of the following - seat belt use, use of sunscreen/sun exposure exercise, fall risk reduction, second hand smoke exposure, firearm use and storage, seat belt use, a need for adhering to healthy diet and exercise. Labs were ordered.  All questions were answered.        Other Visit Diagnoses     Tachycardia    -  Primary   Relevant Orders   EKG 12-Lead   Ambulatory referral to Cardiology         Meds ordered this encounter  Medications   Cholecalciferol (VITAMIN D3) 50 MCG (2000 UT) capsule    Sig: Take 1 capsule (2,000 Units total) by mouth daily.    Dispense:  100 capsule    Refill:  3   metoprolol succinate (TOPROL-XL) 25 MG 24 hr tablet    Sig: Take 1 tablet (25 mg total) by mouth daily.    Dispense:  30 tablet    Refill:  11   rivaroxaban (XARELTO) 20 MG TABS tablet    Sig: Take 1 tablet (20 mg total) by mouth daily.    Dispense:  30 tablet    Refill:  11   cefUROXime (CEFTIN) 250 MG tablet    Sig: Take 1 tablet (250 mg total) by mouth 2 (two) times daily with a meal for 10 days.  Dispense:  20 tablet    Refill:  0      Follow-up: Return in about 4 weeks (around 02/24/2021) for a follow-up visit.  Walker Kehr, MD

## 2021-01-27 NOTE — Assessment & Plan Note (Addendum)
L hand - probable CTS, work related Other diff dx's to be considered too... Use a splint

## 2021-01-27 NOTE — Assessment & Plan Note (Signed)
Ceftin po 

## 2021-01-27 NOTE — Assessment & Plan Note (Signed)

## 2021-01-27 NOTE — Assessment & Plan Note (Signed)
Reduce stress. Trazodone Rx at hs prn

## 2021-01-27 NOTE — Telephone Encounter (Signed)
Pt called in states the cost of the medication Xarelto was to expensive and stated that it was not a generic form for this medication and would like some different if possible more affordable.  Pt is unsure of what pharmacy told him that needed to be done to make it more affordable.   Please advise.

## 2021-01-27 NOTE — Patient Instructions (Addendum)
Atrial Fibrillation  Atrial fibrillation is a type of irregular or rapid heartbeat (arrhythmia). In atrial fibrillation, the top part of the heart (atria) beats in an irregular pattern. This makes the heart unable to pump bloodnormally and effectively. The goal of treatment is to prevent blood clots from forming, control your heart rate, or restore your heartbeat to a normal rhythm. If this condition is not treated, it can cause serious problems, such as a weakened heart muscle (cardiomyopathy) or a stroke. What are the causes? This condition is often caused by medical conditions that damage the heart's electrical system. These include: High blood pressure (hypertension). This is the most common cause. Certain heart problems or conditions, such as heart failure, coronary artery disease, heart valve problems, or heart surgery. Diabetes. Overactive thyroid (hyperthyroidism). Obesity. Chronic kidney disease. In some cases, the cause of this condition is not known. What increases the risk? This condition is more likely to develop in: Older people. People who smoke. Athletes who do endurance exercise. People who have a family history of atrial fibrillation. Men. People who use drugs. People who drink a lot of alcohol. People who have lung conditions, such as emphysema, pneumonia, or COPD. People who have obstructive sleep apnea. What are the signs or symptoms? Symptoms of this condition include: A feeling that your heart is racing or beating irregularly. Discomfort or pain in your chest. Shortness of breath. Sudden light-headedness or weakness. Tiring easily during exercise or activity. Fatigue. Syncope (fainting). Sweating. In some cases, there are no symptoms. How is this diagnosed? Your health care provider may detect atrial fibrillation when taking your pulse. If detected, this condition may be diagnosed with: An electrocardiogram (ECG) to check electrical signals of the  heart. An ambulatory cardiac monitor to record your heart's activity for a few days. A transthoracic echocardiogram (TTE) to create pictures of your heart. A transesophageal echocardiogram (TEE) to create even closer pictures of your heart. A stress test to check your blood supply while you exercise. Imaging tests, such as a CT scan or chest X-ray. Blood tests. How is this treated? Treatment depends on underlying conditions and how you feel when you experience atrial fibrillation. This condition may be treated with: Medicines to prevent blood clots or to treat heart rate or heart rhythm problems. Electrical cardioversion to reset the heart's rhythm. A pacemaker to correct abnormal heart rhythm. Ablation to remove the heart tissue that sends abnormal signals. Left atrial appendage closure to seal the area where blood clots can form. In some cases, underlying conditions will be treated. Follow these instructions at home: Medicines Take over-the counter and prescription medicines only as told by your health care provider. Do not take any new medicines without talking to your health care provider. If you are taking blood thinners: Talk with your health care provider before you take any medicines that contain aspirin or NSAIDs, such as ibuprofen. These medicines increase your risk for dangerous bleeding. Take your medicine exactly as told, at the same time every day. Avoid activities that could cause injury or bruising, and follow instructions about how to prevent falls. Wear a medical alert bracelet or carry a card that lists what medicines you take. Lifestyle     Do not use any products that contain nicotine or tobacco, such as cigarettes, e-cigarettes, and chewing tobacco. If you need help quitting, ask your health care provider. Eat heart-healthy foods. Talk with a dietitian to make an eating plan that is right for you. Exercise regularly as told by   your health care provider. Do not  drink alcohol. Lose weight if you are overweight. Do not use drugs, including cannabis. General instructions If you have obstructive sleep apnea, manage your condition as told by your health care provider. Do not use diet pills unless your health care provider approves. Diet pills can make heart problems worse. Keep all follow-up visits as told by your health care provider. This is important. Contact a health care provider if you: Notice a change in the rate, rhythm, or strength of your heartbeat. Are taking a blood thinner and you notice more bruising. Tire more easily when you exercise or do heavy work. Have a sudden change in weight. Get help right away if you have:  Chest pain, abdominal pain, sweating, or weakness. Trouble breathing. Side effects of blood thinners, such as blood in your vomit, stool, or urine, or bleeding that cannot stop. Any symptoms of a stroke. "BE FAST" is an easy way to remember the main warning signs of a stroke: B - Balance. Signs are dizziness, sudden trouble walking, or loss of balance. E - Eyes. Signs are trouble seeing or a sudden change in vision. F - Face. Signs are sudden weakness or numbness of the face, or the face or eyelid drooping on one side. A - Arms. Signs are weakness or numbness in an arm. This happens suddenly and usually on one side of the body. S - Speech. Signs are sudden trouble speaking, slurred speech, or trouble understanding what people say. T - Time. Time to call emergency services. Write down what time symptoms started. Other signs of a stroke, such as: A sudden, severe headache with no known cause. Nausea or vomiting. Seizure. These symptoms may represent a serious problem that is an emergency. Do not wait to see if the symptoms will go away. Get medical help right away. Call your local emergency services (911 in the [place] Do not drive yourself to the hospital. Summary Atrial fibrillation is a type of irregular or rapid  heartbeat (arrhythmia). Symptoms include a feeling that your heart is beating fast or irregularly. You may be given medicines to prevent blood clots or to treat heart rate or heart rhythm problems. Get help right away if you have signs or symptoms of a stroke. Get help right away if you cannot catch your breath or have chest pain or pressure. This information is not intended to replace advice given to you by your health care provider. Make sure you discuss any questions you have with your healthcare provider. Document Revised: 07/04/2018 Document Reviewed: 07/04/2018 Elsevier Patient Education  2022 [employer]  

## 2021-01-27 NOTE — Assessment & Plan Note (Addendum)
New. EKG with A fib/A flutter w/RVR. HR 138.    Start Toprol, Xarelto Card ref - pls see ASAP

## 2021-01-28 ENCOUNTER — Ambulatory Visit (INDEPENDENT_AMBULATORY_CARE_PROVIDER_SITE_OTHER): Payer: 59 | Admitting: Cardiovascular Disease

## 2021-01-28 ENCOUNTER — Encounter: Payer: Self-pay | Admitting: Cardiovascular Disease

## 2021-01-28 DIAGNOSIS — I4892 Unspecified atrial flutter: Secondary | ICD-10-CM | POA: Diagnosis not present

## 2021-01-28 DIAGNOSIS — I4891 Unspecified atrial fibrillation: Secondary | ICD-10-CM

## 2021-01-28 MED ORDER — METOPROLOL SUCCINATE ER 50 MG PO TB24: 50.0000 mg | ORAL_TABLET | Freq: Every day | ORAL | 3 refills | Status: DC

## 2021-01-28 NOTE — Progress Notes (Signed)
01/28/2021 Eddie Dixon   [birth year]  [id number]  Primary Physician Plotnikov, Evie Lacks, MD Primary Cardiologist: Lorretta Harp MD [name], [place]  HPI:  Eddie Dixon is a 56 y.o. mildly overweight married male father of 2 children who works as a Engineer, manufacturing systems.  He was referred by Dr. Alain Marion, his PCP, for evaluation of newly recognized A. fib with RVR.  He has no cardiac risk factors other than a premature family history of CAD.  His father died of a heart attack at age 60.  He has never had a heart attack or stroke.  He has complaints of dyspnea on exertion over the last several months.  He was found to be in A. fib yesterday by Dr. Alain Marion who began him on a low-dose beta-blocker and Xarelto. The CHA2DSVASC2 score is  0 .    Current Meds  Medication Sig   ARGININE PO Take by mouth daily.     cefUROXime (CEFTIN) 250 MG tablet Take 1 tablet (250 mg total) by mouth 2 (two) times daily with a meal for 10 days.   Cholecalciferol (VITAMIN D3) 50 MCG (2000 UT) capsule Take 1 capsule (2,000 Units total) by mouth daily.   metoprolol succinate (TOPROL-XL) 25 MG 24 hr tablet Take 1 tablet (25 mg total) by mouth daily.   tadalafil (CIALIS) 5 MG tablet Take 1 tablet (5 mg total) by mouth daily.   traZODone (DESYREL) 50 MG tablet Take 0.5-1 tablets (25-50 mg total) by mouth at bedtime as needed for sleep.     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  Tobacco Use   Smoking status: Never   Smokeless tobacco: Current    Types: Chew  Substance and Sexual Activity   Alcohol use: No   Drug use: No   Sexual activity: Yes  Other Topics Concern   Not on file  Social History Narrative   Regular Exercise -  YES         Social Determinants  of Health   Financial Resource Strain: Not on file  Food Insecurity: Not on file  Transportation Needs: Not on file  Physical Activity: Not on file  Stress: Not on file  Social Connections: Not on file  Intimate Partner Violence: Not on file     Review of Systems: General: negative for chills, fever, night sweats or weight changes.  Cardiovascular: negative for chest pain, dyspnea on exertion, edema, orthopnea, palpitations, paroxysmal nocturnal dyspnea or shortness of breath Dermatological: negative for rash Respiratory: negative for cough or wheezing Urologic: negative for hematuria Abdominal: negative for nausea, vomiting, diarrhea, bright red blood per rectum, melena, or hematemesis Neurologic: negative for visual changes, syncope, or dizziness All other systems reviewed and are otherwise negative except as noted above.    Blood pressure 132/80, pulse (!) 140, height 5\' 9"  (1.753 m), weight 236 lb (107 kg), SpO2 98 %.  General appearance: alert and no distress Neck: no adenopathy, no carotid bruit, no JVD, supple, symmetrical, trachea midline, and thyroid not enlarged, symmetric, no tenderness/mass/nodules Lungs: clear to auscultation bilaterally Heart: irregularly irregular rhythm Extremities: extremities normal, atraumatic, no cyanosis or edema Pulses: 2+ and symmetric Skin: Skin color, texture, turgor normal. No rashes  or lesions Neurologic: Grossly normal  EKG atrial fibrillation with ventricular spots of 140 and septal Q waves.  I personally reviewed this EKG.  ASSESSMENT AND PLAN:   Atrial fibrillation and flutter [facility] Mr. Steighner was referred by Dr. Alain Marion for evaluation of newly recognized A. fib with RVR.  He basically has no risk factors.  He was unaware that he was in A. fib.  He has noticed some increasing dyspnea over the last several months.  Dr. Alain Marion wrote a prescription for Toprol-XL 25 mg and Xarelto.  His heart rate today is 140.  I am going to  increase his Toprol to 50 mg, expedite his Xarelto prescription.  We will check a 2D echocardiogram.  His TSH was normal.  We will arrange outpatient DC cardioversion in 4 weeks after which we will pursue an ischemia work-up.     Lorretta Harp MD FACP,FACC,FAHA, [facility] 01/28/2021 8:57 AM

## 2021-01-28 NOTE — Patient Instructions (Addendum)
Medication Instructions:   -Start Xarelto 20mg  once daily with supper.   -Increase metoprolol succinate (toprol-xl) to 50mg  once daily.  *If you need a refill on your cardiac medications before your next appointment, please call your pharmacy*   Lab Work: Your physician recommends that you return for lab work in: week prior to cardioversion  If you have labs (blood work) drawn today and your tests are completely normal, you will receive your results only by: MyChart Message (if you have MyChart) OR A paper copy in the mail If you have any lab test that is abnormal or we need to change your treatment, we will call you to review the results.   Testing/Procedures: Your physician has requested that you have an echocardiogram. Echocardiography is a painless test that uses sound waves to create images of your heart. It provides your doctor with information about the size and shape of your heart and how well your hearts chambers and valves are working. This procedure takes approximately one hour. There are no restrictions for this procedure. This procedure is done at [address] .   Follow-Up: At [facility], you and your health needs are our priority.  As part of our continuing mission to provide you with exceptional heart care, we have created designated Provider Care Teams.  These Care Teams include your primary Cardiologist (physician) and Advanced Practice Providers (APPs -  Physician Assistants and Nurse Practitioners) who all work together to provide you with the care you need, when you need it.  We recommend signing up for the patient portal called "MyChart".  Sign up information is provided on this After Visit Summary.  MyChart is used to connect with patients for Virtual Visits (Telemedicine).  Patients are able to view lab/test results, encounter notes, upcoming appointments, etc.  Non-urgent messages can be sent to your provider as well.   To learn more about what you can do  with MyChart, go to [employer].    Your next appointment:   6 week(s)  The format for your next appointment:   In Person  Provider:   [facility], MD  Other Instructions  You are scheduled for a Cardioversion on February 6th with Dr. Nanetta Batty.  Please arrive at the [facility] (Main Entrance A) at [facility]: [address] [place], [address] [place] at 8 am. (1 hour prior to procedure unless lab work is needed; if lab work is needed arrive 1.5 hours ahead)  DIET: Nothing to eat or drink after midnight except a sip of water with medications (see medication instructions below)  FYI: For your safety, and to allow [place] to monitor your vital signs accurately during the surgery/procedure we request that   if you have artificial nails, gel coating, SNS etc. Please have those removed prior to your surgery/procedure. Not having the nail coverings /polish removed may result in cancellation or delay of your surgery/procedure.    Continue your anticoagulant: Xarelto You will need to continue your anticoagulant after your procedure until you  are told by your  Provider that it is safe to stop   Labs: to be done 1 week prior to procedure.  You must have a responsible person to drive you home and stay in the waiting area during your procedure. Failure to do so could result in cancellation.  Bring your insurance cards.  *Special Note: Every effort is made to have your procedure done on time. Occasionally there are emergencies that occur at the hospital that may cause  delays. Please be patient if a delay does occur.

## 2021-01-28 NOTE — Assessment & Plan Note (Signed)
Eddie Dixon was referred by Dr. Posey Rea for evaluation of newly recognized A. fib with RVR.  He basically has no risk factors.  He was unaware that he was in A. fib.  He has noticed some increasing dyspnea over the last several months.  Dr. Posey Rea wrote a prescription for Toprol-XL 25 mg and Xarelto.  His heart rate today is 140.  I am going to increase his Toprol to 50 mg, expedite his Xarelto prescription.  We will check a 2D echocardiogram.  His TSH was normal.  We will arrange outpatient DC cardioversion in 4 weeks after which we will pursue an ischemia work-up.

## 2021-01-28 NOTE — Telephone Encounter (Signed)
He use a savings card I gave him? Can we give him samples until he sees cardiology? Thanks

## 2021-01-29 ENCOUNTER — Other Ambulatory Visit: Payer: Self-pay

## 2021-01-29 DIAGNOSIS — I4891 Unspecified atrial fibrillation: Secondary | ICD-10-CM

## 2021-01-29 NOTE — Telephone Encounter (Signed)
Called pt there was no answer LMOM RTC.. Rec'd PA for the xarelto started on cover-my-meds w/ key (Key: [id number]). Rec'd mag stating Your information has been submitted and will be reviewed by [place]. You may close this dialog, return to your dashboard, and perform other tasks. An electronic determination will be received in CoverMyMeds within 72-120 hours.".Marland KitchenRaechel Chute

## 2021-02-03 NOTE — Telephone Encounter (Signed)
Check status of PA med was approved [id number]; Coverage Start Date:01/29/2021; Coverage End Date:01/29/2022;. Pt already seen cardiologist he increased the xarelto 20 mg and also increased metoprolol to 50 mg../lmb

## 2021-02-04 ENCOUNTER — Telehealth: Payer: Self-pay | Admitting: Internal Medicine

## 2021-02-04 ENCOUNTER — Other Ambulatory Visit: Payer: Self-pay | Admitting: Internal Medicine

## 2021-02-04 MED ORDER — METOPROLOL SUCCINATE ER 50 MG PO TB24: 25.0000 mg | ORAL_TABLET | Freq: Two times a day (BID) | ORAL | 3 refills | Status: DC

## 2021-02-04 NOTE — Telephone Encounter (Signed)
Try to take metoprolol 50 mg 1/2 tablet twice a day.  It may help to diminish side effects.  Thanks

## 2021-02-04 NOTE — Telephone Encounter (Signed)
Patient calling in  Patient says since starting med  metoprolol succinate (TOPROL-XL) 50 MG 24 hr tablet he has been experiencing "knotting feeling" in his stomach like a pain.. patient says he is drinking water & eating when taking medication  Wants to know if provider can suggest something else please call [phone]

## 2021-02-05 NOTE — Telephone Encounter (Signed)
Tried calling pt there was no answer & could not leave msg due to vm full.Marland KitchenRaechel Dixon

## 2021-02-05 NOTE — Telephone Encounter (Signed)
Pt return call back gave him MD response. Pt states he never pick up the 50 mg that the cardiologist rx, he stayed on the 25 mg.. and the 25 mg is what is upsetting his stomach. Pt states he will take 1/2 of the metoprolol 25 mg twice a day to see how that works for him. Inform pt will let MD know.Marland KitchenRaechel Chute

## 2021-02-07 NOTE — Telephone Encounter (Signed)
Noted.  Okay to try 12.5 mg of metoprolol twice a day.  Thanks

## 2021-02-11 ENCOUNTER — Telehealth: Payer: Self-pay | Admitting: Cardiovascular Disease

## 2021-02-11 NOTE — Telephone Encounter (Signed)
Patient called stating he went to refill his Xarelto and his copay is almost $500, he can't afford that. He wants to know if there are samples that be given to him to hold him over to his next appt or something else that can be prescribed to him to hold him.

## 2021-02-11 NOTE — Telephone Encounter (Signed)
Left message for pt to call back  °

## 2021-02-12 ENCOUNTER — Telehealth: Payer: Self-pay | Admitting: Cardiovascular Disease

## 2021-02-12 MED ORDER — RIVAROXABAN 20 MG PO TABS: 20.0000 mg | ORAL_TABLET | Freq: Every day | ORAL | 1 refills | Status: DC

## 2021-02-12 NOTE — Telephone Encounter (Signed)
Spoke with pt regarding xarelto. Pt states that medication will be $500 to refill at the pharmacy. 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 Called pt to discuss this information. Pt verbalizes understanding. Pt did run out of his medication and has missed 3 days worth of doses. Pt will pick up samples from the office on Monday to take until we can remedy the situation. Pt is scheduled for cardioversion, which will need to be re-scheduled.  While on the phone with pt, he explains that the metoprolol that he is taking is upsetting his stomach and was wondering if a different medication could be prescribed. Advised pt that I would send this message to Dr. Allyson Sabal to advise. Pt verbalizes understanding.   Medication samples have been provided to the patient.  Drug name: Xarelto 20mg  Qty: 2 bottles LOT: Exp.Date: 4/25  Samples left at front desk for patient pick-up. Patient notified.

## 2021-02-12 NOTE — Telephone Encounter (Signed)
Follow Up:     Patient was returning your call.

## 2021-02-13 ENCOUNTER — Other Ambulatory Visit: Payer: Self-pay

## 2021-02-13 ENCOUNTER — Emergency Department (HOSPITAL_COMMUNITY): Payer: Managed Care, Other (non HMO)

## 2021-02-13 ENCOUNTER — Inpatient Hospital Stay (HOSPITAL_COMMUNITY)
Admission: EM | Admit: 2021-02-13 | Discharge: 2021-02-23 | DRG: 286 | Disposition: A | Discharge status: Home / Self Care | Payer: Managed Care, Other (non HMO) | Source: Ambulatory Visit | Attending: Family Medicine | Admitting: Family Medicine

## 2021-02-13 ENCOUNTER — Encounter (HOSPITAL_COMMUNITY): Payer: Self-pay | Admitting: Emergency Medicine

## 2021-02-13 DIAGNOSIS — E669 Obesity, unspecified: Secondary | ICD-10-CM | POA: Diagnosis present

## 2021-02-13 DIAGNOSIS — T45516A Underdosing of anticoagulants, initial encounter: Secondary | ICD-10-CM | POA: Diagnosis present

## 2021-02-13 DIAGNOSIS — I959 Hypotension, unspecified: Secondary | ICD-10-CM | POA: Diagnosis not present

## 2021-02-13 DIAGNOSIS — I5043 Acute on chronic combined systolic (congestive) and diastolic (congestive) heart failure: Secondary | ICD-10-CM | POA: Diagnosis present

## 2021-02-13 DIAGNOSIS — G8191 Hemiplegia, unspecified affecting right dominant side: Secondary | ICD-10-CM | POA: Diagnosis not present

## 2021-02-13 DIAGNOSIS — I517 Cardiomegaly: Secondary | ICD-10-CM | POA: Diagnosis present

## 2021-02-13 DIAGNOSIS — Z91120 Patient's intentional underdosing of medication regimen due to financial hardship: Secondary | ICD-10-CM

## 2021-02-13 DIAGNOSIS — I6330 Cerebral infarction due to thrombosis of unspecified cerebral artery: Secondary | ICD-10-CM

## 2021-02-13 DIAGNOSIS — F419 Anxiety disorder, unspecified: Secondary | ICD-10-CM | POA: Diagnosis present

## 2021-02-13 DIAGNOSIS — I2510 Atherosclerotic heart disease of native coronary artery without angina pectoris: Secondary | ICD-10-CM | POA: Diagnosis present

## 2021-02-13 DIAGNOSIS — I110 Hypertensive heart disease with heart failure: Principal | ICD-10-CM | POA: Diagnosis present

## 2021-02-13 DIAGNOSIS — Z79899 Other long term (current) drug therapy: Secondary | ICD-10-CM

## 2021-02-13 DIAGNOSIS — Z8249 Family history of ischemic heart disease and other diseases of the circulatory system: Secondary | ICD-10-CM

## 2021-02-13 DIAGNOSIS — I340 Nonrheumatic mitral (valve) insufficiency: Secondary | ICD-10-CM | POA: Diagnosis not present

## 2021-02-13 DIAGNOSIS — Z6832 Body mass index (BMI) 32.0-32.9, adult: Secondary | ICD-10-CM | POA: Diagnosis not present

## 2021-02-13 DIAGNOSIS — E785 Hyperlipidemia, unspecified: Secondary | ICD-10-CM | POA: Diagnosis present

## 2021-02-13 DIAGNOSIS — R7303 Prediabetes: Secondary | ICD-10-CM | POA: Diagnosis present

## 2021-02-13 DIAGNOSIS — I509 Heart failure, unspecified: Secondary | ICD-10-CM | POA: Diagnosis not present

## 2021-02-13 DIAGNOSIS — Z95828 Presence of other vascular implants and grafts: Secondary | ICD-10-CM

## 2021-02-13 DIAGNOSIS — Q2112 Patent foramen ovale: Secondary | ICD-10-CM

## 2021-02-13 DIAGNOSIS — Z789 Other specified health status: Secondary | ICD-10-CM | POA: Diagnosis present

## 2021-02-13 DIAGNOSIS — I5021 Acute systolic (congestive) heart failure: Secondary | ICD-10-CM | POA: Diagnosis present

## 2021-02-13 DIAGNOSIS — F1020 Alcohol dependence, uncomplicated: Secondary | ICD-10-CM | POA: Diagnosis present

## 2021-02-13 DIAGNOSIS — I63412 Cerebral infarction due to embolism of left middle cerebral artery: Secondary | ICD-10-CM | POA: Diagnosis not present

## 2021-02-13 DIAGNOSIS — N179 Acute kidney failure, unspecified: Secondary | ICD-10-CM | POA: Diagnosis present

## 2021-02-13 DIAGNOSIS — F1010 Alcohol abuse, uncomplicated: Secondary | ICD-10-CM | POA: Diagnosis not present

## 2021-02-13 DIAGNOSIS — I4819 Other persistent atrial fibrillation: Secondary | ICD-10-CM | POA: Diagnosis not present

## 2021-02-13 DIAGNOSIS — I5020 Unspecified systolic (congestive) heart failure: Secondary | ICD-10-CM | POA: Diagnosis not present

## 2021-02-13 DIAGNOSIS — Z720 Tobacco use: Secondary | ICD-10-CM | POA: Diagnosis not present

## 2021-02-13 DIAGNOSIS — I480 Paroxysmal atrial fibrillation: Secondary | ICD-10-CM | POA: Diagnosis present

## 2021-02-13 DIAGNOSIS — Z7984 Long term (current) use of oral hypoglycemic drugs: Secondary | ICD-10-CM

## 2021-02-13 DIAGNOSIS — Z7901 Long term (current) use of anticoagulants: Secondary | ICD-10-CM

## 2021-02-13 DIAGNOSIS — I513 Intracardiac thrombosis, not elsewhere classified: Secondary | ICD-10-CM | POA: Diagnosis present

## 2021-02-13 DIAGNOSIS — I639 Cerebral infarction, unspecified: Secondary | ICD-10-CM | POA: Diagnosis not present

## 2021-02-13 DIAGNOSIS — Z9114 Patient's other noncompliance with medication regimen: Secondary | ICD-10-CM

## 2021-02-13 DIAGNOSIS — Z20822 Contact with and (suspected) exposure to covid-19: Secondary | ICD-10-CM | POA: Diagnosis present

## 2021-02-13 DIAGNOSIS — I428 Other cardiomyopathies: Secondary | ICD-10-CM | POA: Diagnosis present

## 2021-02-13 DIAGNOSIS — I4891 Unspecified atrial fibrillation: Secondary | ICD-10-CM | POA: Diagnosis not present

## 2021-02-13 DIAGNOSIS — I361 Nonrheumatic tricuspid (valve) insufficiency: Secondary | ICD-10-CM | POA: Diagnosis not present

## 2021-02-13 HISTORY — DX: Unspecified atrial fibrillation: I48.91

## 2021-02-13 LAB — HEPATIC FUNCTION PANEL
ALT: 68 U/L — ABNORMAL HIGH (ref 0–44)
AST: 47 U/L — ABNORMAL HIGH (ref 15–41)
Albumin: 3.7 g/dL (ref 3.5–5.0)
Alkaline Phosphatase: 46 U/L (ref 38–126)
Bilirubin, Direct: 0.3 mg/dL — ABNORMAL HIGH (ref 0.0–0.2)
Indirect Bilirubin: 1 mg/dL — ABNORMAL HIGH (ref 0.3–0.9)
Total Bilirubin: 1.3 mg/dL — ABNORMAL HIGH (ref 0.3–1.2)
Total Protein: 6 g/dL — ABNORMAL LOW (ref 6.5–8.1)

## 2021-02-13 LAB — BASIC METABOLIC PANEL
Anion gap: 9 (ref 5–15)
BUN: 19 mg/dL (ref 6–20)
CO2: 22 mmol/L (ref 22–32)
Calcium: 9.3 mg/dL (ref 8.9–10.3)
Chloride: 108 mmol/L (ref 98–111)
Creatinine, Ser: 1.52 mg/dL — ABNORMAL HIGH (ref 0.61–1.24)
GFR, Estimated: 54 mL/min — ABNORMAL LOW (ref >60)
Glucose, Bld: 116 mg/dL — ABNORMAL HIGH (ref 70–99)
Potassium: 5.1 mmol/L (ref 3.5–5.1)
Sodium: 139 mmol/L (ref 135–145)

## 2021-02-13 LAB — CBC
HCT: 50.2 % (ref 39.0–52.0)
Hemoglobin: 16.3 g/dL (ref 13.0–17.0)
MCH: 30.8 pg (ref 26.0–34.0)
MCHC: 32.5 g/dL (ref 30.0–36.0)
MCV: 94.7 fL (ref 80.0–100.0)
Platelets: 274 10*3/uL (ref 150–400)
RBC: 5.3 MIL/uL (ref 4.22–5.81)
RDW: 11.9 % (ref 11.5–15.5)
WBC: 4.8 10*3/uL (ref 4.0–10.5)
nRBC: 0 % (ref 0.0–0.2)

## 2021-02-13 LAB — PROTIME-INR
INR: 1.1 (ref 0.8–1.2)
Prothrombin Time: 14.5 seconds (ref 11.4–15.2)

## 2021-02-13 LAB — RESP PANEL BY RT-PCR (FLU A&B, COVID) ARPGX2
Influenza A by PCR: NEGATIVE
Influenza B by PCR: NEGATIVE
SARS Coronavirus 2 by RT PCR: NEGATIVE

## 2021-02-13 LAB — D-DIMER, QUANTITATIVE: D-Dimer, Quant: 0.97 ug/mL-FEU — ABNORMAL HIGH (ref 0.00–0.50)

## 2021-02-13 LAB — TROPONIN I (HIGH SENSITIVITY): Troponin I (High Sensitivity): 32 ng/L — ABNORMAL HIGH (ref <18)

## 2021-02-13 LAB — BRAIN NATRIURETIC PEPTIDE: B Natriuretic Peptide: 1340.6 pg/mL — ABNORMAL HIGH (ref 0.0–100.0)

## 2021-02-13 MED ORDER — SODIUM CHLORIDE 0.9% FLUSH
3.0000 mL | Freq: Two times a day (BID) | INTRAVENOUS | Status: DC
  Administered 2021-02-15 – 2021-02-22 (×9): 3 mL via INTRAVENOUS

## 2021-02-13 MED ORDER — ACETAMINOPHEN 325 MG PO TABS: 650.0000 mg | ORAL_TABLET | ORAL | Status: DC | PRN

## 2021-02-13 MED ORDER — FOLIC ACID 1 MG PO TABS
1.0000 mg | ORAL_TABLET | Freq: Every day | ORAL | Status: DC
  Administered 2021-02-13 – 2021-02-23 (×11): 1 mg via ORAL
  Filled 2021-02-13 (×11): qty 1

## 2021-02-13 MED ORDER — FUROSEMIDE 10 MG/ML IJ SOLN
40.0000 mg | Freq: Once | INTRAMUSCULAR | Status: AC
Start: 2021-02-13 — End: 2021-02-13
  Administered 2021-02-13: 40 mg via INTRAVENOUS
  Filled 2021-02-13: qty 4

## 2021-02-13 MED ORDER — CARVEDILOL 3.125 MG PO TABS
6.2500 mg | ORAL_TABLET | Freq: Two times a day (BID) | ORAL | Status: DC
  Administered 2021-02-14: 6.25 mg via ORAL
  Filled 2021-02-13: qty 2

## 2021-02-13 MED ORDER — AMIODARONE HCL IN DEXTROSE 360-4.14 MG/200ML-% IV SOLN: 30.0000 mg/h | INTRAVENOUS | Status: DC

## 2021-02-13 MED ORDER — AMIODARONE LOAD VIA INFUSION
150.0000 mg | Freq: Once | INTRAVENOUS | Status: AC
  Administered 2021-02-13: 150 mg via INTRAVENOUS
  Filled 2021-02-13: qty 83.34

## 2021-02-13 MED ORDER — AMIODARONE HCL IN DEXTROSE 360-4.14 MG/200ML-% IV SOLN
60.0000 mg/h | INTRAVENOUS | Status: DC
  Administered 2021-02-13: 60 mg/h via INTRAVENOUS
  Filled 2021-02-13: qty 200

## 2021-02-13 MED ORDER — CARVEDILOL 3.125 MG PO TABS
3.1250 mg | ORAL_TABLET | Freq: Once | ORAL | Status: AC
  Administered 2021-02-13: 3.125 mg via ORAL
  Filled 2021-02-13: qty 1

## 2021-02-13 MED ORDER — LORAZEPAM 1 MG PO TABS: 1.0000 mg | ORAL_TABLET | ORAL | Status: AC | PRN

## 2021-02-13 MED ORDER — SODIUM CHLORIDE 0.9% FLUSH: 3.0000 mL | INTRAVENOUS | Status: DC | PRN

## 2021-02-13 MED ORDER — HEPARIN (PORCINE) 25000 UT/250ML-% IV SOLN
1300.0000 [IU]/h | INTRAVENOUS | Status: DC
  Administered 2021-02-13: 1300 [IU]/h via INTRAVENOUS
  Filled 2021-02-13: qty 250

## 2021-02-13 MED ORDER — DILTIAZEM LOAD VIA INFUSION
15.0000 mg | Freq: Once | INTRAVENOUS | Status: DC
  Filled 2021-02-13: qty 15

## 2021-02-13 MED ORDER — LORAZEPAM 2 MG/ML IJ SOLN: 1.0000 mg | INTRAMUSCULAR | Status: AC | PRN

## 2021-02-13 MED ORDER — ONDANSETRON HCL 4 MG/2ML IJ SOLN: 4.0000 mg | Freq: Four times a day (QID) | INTRAMUSCULAR | Status: DC | PRN

## 2021-02-13 MED ORDER — THIAMINE HCL 100 MG PO TABS
100.0000 mg | ORAL_TABLET | Freq: Every day | ORAL | Status: DC
  Administered 2021-02-13 – 2021-02-23 (×11): 100 mg via ORAL
  Filled 2021-02-13 (×11): qty 1

## 2021-02-13 MED ORDER — IOHEXOL 350 MG/ML SOLN
80.0000 mL | Freq: Once | INTRAVENOUS | Status: AC | PRN
  Administered 2021-02-13: 80 mL via INTRAVENOUS

## 2021-02-13 MED ORDER — SODIUM CHLORIDE 0.9 % IV SOLN: 250.0000 mL | INTRAVENOUS | Status: DC | PRN

## 2021-02-13 MED ORDER — THIAMINE HCL 100 MG/ML IJ SOLN
100.0000 mg | Freq: Every day | INTRAMUSCULAR | Status: DC
  Filled 2021-02-13: qty 2

## 2021-02-13 MED ORDER — DILTIAZEM HCL-DEXTROSE 125-5 MG/125ML-% IV SOLN (PREMIX)
5.0000 mg/h | INTRAVENOUS | Status: DC
  Filled 2021-02-13: qty 125

## 2021-02-13 MED ORDER — ADULT MULTIVITAMIN W/MINERALS CH
1.0000 | ORAL_TABLET | Freq: Every day | ORAL | Status: DC
  Administered 2021-02-13 – 2021-02-15 (×3): 1 via ORAL
  Filled 2021-02-13 (×3): qty 1

## 2021-02-13 NOTE — ED Notes (Signed)
RN messaged pharmacy for verification of 1945 medications

## 2021-02-13 NOTE — Consult Note (Signed)
CARDIOLOGY CONSULT NOTE       Patient ID: Ballard Thibodeaux MRN: [id number] DOB/AGE: [birth year] 56 y.o.  Admit date: 02/13/2021 Referring Physician: Billy Fischer Primary Physician: Cassandria Anger, MD Primary Cardiologist: Gwenlyn Found Reason for Consultation: AFib  Active Problems:   * No active hospital problems. *   HPI:  56 y.o. lumbee indian seen in ED with rapid afib. Patient has seen Dr Gwenlyn Found on 01/28/21 and started on Cardizem and Xarelto. The cardizem made him feel sick to his stomach and he stopped taking it. He stopped taking Xarelto Wednesday He is a Building control surveyor. Married to a girl from [place]. He has 4 children age range 14-26. 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 ETOH abuse runs in family Currently in no distress AFib rate high no chest pain ? 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 No pertinent surgical history.    Current Facility-Administered Medications:    amiodarone (NEXTERONE) 1.8 mg/mL load via infusion 150 mg, 150 mg, Intravenous, Once **FOLLOWED BY** amiodarone (NEXTERONE PREMIX) 360-4.14 MG/200ML-% (1.8 mg/mL) IV infusion, 60 mg/hr, Intravenous, Continuous **FOLLOWED BY** amiodarone (NEXTERONE PREMIX) 360-4.14 MG/200ML-% (1.8 mg/mL) IV infusion, 30 mg/hr, Intravenous, Continuous, Schlossman, Erin, MD  Current Outpatient Medications:    ARGININE PO, Take by mouth daily.  , Disp: , Rfl:    Cholecalciferol (VITAMIN D3) 50 MCG (2000 UT) capsule, Take 1 capsule (2,000 Units total) by mouth daily., Disp: 100 capsule, Rfl: 3   metoprolol succinate (TOPROL-XL) 50 MG 24 hr tablet, Take 0.5 tablets (25 mg total) by mouth in the morning and at bedtime., Disp: 90 tablet, Rfl: 3   rivaroxaban (XARELTO) 20 MG TABS tablet, Take 1 tablet (20 mg total) by mouth daily with supper., Disp: 90 tablet, Rfl: 1   tadalafil (CIALIS) 5 MG tablet, Take 1 tablet (5 mg total) by mouth daily., Disp: 90 tablet, Rfl: 3   traZODone (DESYREL) 50 MG tablet, Take 0.5-1 tablets (25-50 mg total) by mouth at bedtime as needed for sleep., Disp: 30 tablet, Rfl: 5  amiodarone  150 mg Intravenous Once  amiodarone     Followed by   amiodarone      Physical Exam: Blood pressure (!) 116/104, pulse (!) 155, temperature 98 F (36.7 C), resp. rate (!) 35, SpO2 99 %.   Affect appropriate Healthy:  appears stated age HEENT: normal Neck supple with no adenopathy JVP normal no bruits no thyromegaly Lungs clear with no wheezing and good diaphragmatic motion Heart:  S1/S2 no murmur, no rub, gallop or click PMI normal Abdomen: benighn, BS positve, no tenderness, no AAA no bruit.  No HSM or HJR Distal pulses intact with no bruits No edema Neuro non-focal Skin warm and dry No muscular weakness   Labs:   Lab Results  Component Value Date   WBC 4.8  02/13/2021   HGB 16.3 02/13/2021   HCT 50.2 02/13/2021   MCV 94.7 02/13/2021   PLT 274 02/13/2021    Recent Labs  Lab 02/13/21 1446 02/13/21 1533  NA 139  --   K 5.1  --   CL 108  --   CO2 22  --   BUN 19  --   CREATININE 1.52*  --   CALCIUM 9.3  --   PROT  --  6.0*  BILITOT  --  1.3*  ALKPHOS  --  46  ALT  --  68*  AST  --  47*  GLUCOSE 116*  --    No results found for: CKTOTAL, CKMB, CKMBINDEX, TROPONINI  Lab Results  Component Value Date   CHOL 159 01/19/2021   CHOL 191 07/02/2015   CHOL 168 06/30/2014   Lab Results  Component Value Date   HDL 58.80 01/19/2021   HDL 61.70 07/02/2015   HDL 66.00 06/30/2014   Lab Results  Component Value Date   LDLCALC 83 01/19/2021   LDLCALC 111 (H) 07/02/2015   LDLCALC 87 06/30/2014   Lab Results  Component Value Date   TRIG 83.0 01/19/2021   TRIG 91.0 07/02/2015   TRIG 75.0 06/30/2014   Lab Results  Component Value Date   CHOLHDL 3 01/19/2021   CHOLHDL 3 07/02/2015   CHOLHDL 3 06/30/2014   Lab Results  Component Value Date   LDLDIRECT 146.3 01/24/2008      Radiology: DG Chest 2 View  Result Date: 02/13/2021 CLINICAL DATA:  Atrial fibrillation EXAM: CHEST - 2 VIEW COMPARISON:  None. FINDINGS: Cardiomegaly noted without CHF. Increased basilar streaky opacities favored to be atelectasis. No definite focal pneumonia, collapse or consolidation. No large effusion or pneumothorax. Trachea midline. Minor endplate degenerative changes of the spine. Monitor leads overlie the chest. IMPRESSION: Cardiomegaly and bibasilar atelectasis. Electronically Signed   By: Judie Petit.  Shick M.D.   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 Non compliant with meds Give dose of xarelto and renew script. Ok to give some amiodarone in ER Coreg 6.25 bid. D/c Cardizem. One dose of lasix given possible mild volume overload. CXR with CE atelectasis no  cephalization If his rate slows he can be d/c He has an echo scheduled for Monday at our office and may very well have low EF from tachycardia and ETOH.  If he does not slow can be admitted by hospitalist. Encouraged him to seek counseling and talk to primary Dr Paulette Blanch about his alcohol His afib will be hard to control while still drinking    Signed: Charlton Haws 02/13/2021, 4:24 PM

## 2021-02-13 NOTE — H&P (Signed)
History and Physical    Eddie Dixon [id number] DOB: 1965-08-20 DOA: 02/13/2021  PCP: Cassandria Anger, MD  Patient coming from: Home  I have personally briefly reviewed patient's old medical records in [place]  Chief Complaint: A.Fib RVR, CHF  HPI: Eddie Dixon is a 56 y.o. male with medical history significant of EtOH abuse (ongoing).  Pt with new diagnosis of A.Fib recently, saw Dr. Gwenlyn Found 1/5, started initially on cardizem and xarelto.  Cardizem made him feel sick so he quit taking this.  Plotnikov started him on metoprolol which he quit taking because he felt it gave him stomach cramps.  Stopped taking Xarelto a couple of days ago due to cost.  Presents to ED with ongoing A.Fib RVR, and peripheral edema in legs worsening over the past couple of days.   ED Course: A.Fib RVR with rate 150s initially.  Soft BPs.  CTA chest = no PE, has mod R pleural effusion and cardiomegally  Creat 1.5 up from 1.2  Seen by cards in ED.   Past Medical History:  Diagnosis Date   Anxiety    ED (erectile dysfunction)    Elevated blood pressure     History reviewed. No pertinent surgical history.   reports that he has never smoked. His smokeless tobacco use includes chew. He reports current alcohol use. He reports that he does not use drugs.  No Known Allergies  Family History  Problem Relation Age of Onset   Heart disease Father 16       MI    Prior to Admission medications   Medication Sig Start Date End Date Taking? Authorizing Provider  ARGININE PO Take by mouth daily.      [provider]  Cholecalciferol (VITAMIN D3) 50 MCG (2000 UT) capsule Take 1 capsule (2,000 Units total) by mouth daily. 01/27/21   Plotnikov, Evie Lacks, MD  metoprolol succinate (TOPROL-XL) 50 MG 24 hr tablet Take 0.5 tablets (25 mg total) by mouth in the morning and at bedtime. 02/04/21   Plotnikov, Evie Lacks, MD  rivaroxaban (XARELTO) 20 MG TABS tablet Take 1 tablet (20 mg  total) by mouth daily with supper. 02/12/21   Lorretta Harp, MD  tadalafil (CIALIS) 5 MG tablet Take 1 tablet (5 mg total) by mouth daily. 11/16/20   Plotnikov, Evie Lacks, MD  traZODone (DESYREL) 50 MG tablet Take 0.5-1 tablets (25-50 mg total) by mouth at bedtime as needed for sleep. 11/16/20   Plotnikov, Evie Lacks, MD    Physical Exam: Vitals:   02/13/21 1520 02/13/21 1643 02/13/21 1651 02/13/21 1800  BP: (!) 116/104 113/88  100/89  Pulse: (!) 155 (!) 121 (!) 129 (!) 120  Resp: (!) 35 11 10 (!) 25  Temp:      SpO2: 99% 98% 98% 100%    Constitutional: NAD, calm, comfortable Eyes: PERRL, lids and conjunctivae normal ENMT: Mucous membranes are moist. Posterior pharynx clear of any exudate or lesions.Normal dentition.  Neck: normal, supple, no masses, no thyromegaly Respiratory: clear to auscultation bilaterally, no wheezing, no crackles. Normal respiratory effort. No accessory muscle use.  Cardiovascular: Tachycardic, irr, irr, 3+ BLE edema present. Abdomen: no tenderness, no masses palpated. No hepatosplenomegaly. Bowel sounds positive.  Musculoskeletal: no clubbing / cyanosis. No joint deformity upper and lower extremities. Good ROM, no contractures. Normal muscle tone.  Skin: no rashes, lesions, ulcers. No induration Neurologic: CN 2-12 grossly intact. Sensation intact, DTR normal. Strength 5/5 in all 4.  Psychiatric: Normal judgment and insight. Alert  and oriented x 3. Normal mood.    Labs on Admission: I have personally reviewed following labs and imaging studies  CBC: Recent Labs  Lab 02/13/21 1446  WBC 4.8  HGB 16.3  HCT 50.2  MCV 94.7  PLT [id number]   Basic Metabolic Panel: Recent Labs  Lab 02/13/21 1446  NA 139  K 5.1  CL 108  CO2 22  GLUCOSE 116*  BUN 19  CREATININE 1.52*  CALCIUM 9.3   GFR: CrCl cannot be calculated (Unknown ideal weight.). Liver Function Tests: Recent Labs  Lab 02/13/21 1533  AST 47*  ALT 68*  ALKPHOS 46  BILITOT 1.3*  PROT 6.0*   ALBUMIN 3.7   No results for input(s): LIPASE, AMYLASE in the last 168 hours. No results for input(s): AMMONIA in the last 168 hours. Coagulation Profile: Recent Labs  Lab 02/13/21 1446  INR 1.1   Cardiac Enzymes: No results for input(s): CKTOTAL, CKMB, CKMBINDEX, TROPONINI in the last 168 hours. BNP (last 3 results) No results for input(s): PROBNP in the last 8760 hours. HbA1C: No results for input(s): HGBA1C in the last 72 hours. CBG: No results for input(s): GLUCAP in the last 168 hours. Lipid Profile: No results for input(s): CHOL, HDL, LDLCALC, TRIG, CHOLHDL, LDLDIRECT in the last 72 hours. Thyroid Function Tests: No results for input(s): TSH, T4TOTAL, FREET4, T3FREE, THYROIDAB in the last 72 hours. Anemia Panel: No results for input(s): VITAMINB12, FOLATE, FERRITIN, TIBC, IRON, RETICCTPCT in the last 72 hours. Urine analysis:    Component Value Date/Time   COLORURINE YELLOW 01/19/2021 0935   APPEARANCEUR CLEAR 01/19/2021 0935   LABSPEC 1.025 01/19/2021 0935   PHURINE 5.5 01/19/2021 0935   GLUCOSEU NEGATIVE 01/19/2021 0935   HGBUR NEGATIVE 01/19/2021 0935   BILIRUBINUR SMALL (A) 01/19/2021 0935   KETONESUR TRACE (A) 01/19/2021 0935   PROTEINUR NEGATIVE 02/23/2008 1401   UROBILINOGEN 0.2 01/19/2021 0935   NITRITE NEGATIVE 01/19/2021 0935   LEUKOCYTESUR SMALL (A) 01/19/2021 0935    Radiological Exams on Admission: DG Chest 2 View  Result Date: 02/13/2021 CLINICAL DATA:  Atrial fibrillation EXAM: CHEST - 2 VIEW COMPARISON:  None. FINDINGS: Cardiomegaly noted without CHF. Increased basilar streaky opacities favored to be atelectasis. No definite focal pneumonia, collapse or consolidation. No large effusion or pneumothorax. Trachea midline. Minor endplate degenerative changes of the spine. Monitor leads overlie the chest. IMPRESSION: Cardiomegaly and bibasilar atelectasis. Electronically Signed   By: Jerilynn Mages.  Shick M.D.   On: 02/13/2021 16:07   CT Angio Chest PE W and/or Wo  Contrast  Result Date: 02/13/2021 CLINICAL DATA:  Pulmonary embolism (PE) suspected, positive D-dimer EXAM: CT ANGIOGRAPHY CHEST WITH CONTRAST TECHNIQUE: Multidetector CT imaging of the chest was performed using the standard protocol during bolus administration of intravenous contrast. Multiplanar CT image reconstructions and MIPs were obtained to evaluate the vascular anatomy. RADIATION DOSE REDUCTION: This exam was performed according to the departmental dose-optimization program which includes automated exposure control, adjustment of the mA and/or kV according to patient size and/or use of iterative reconstruction technique. CONTRAST:  44mL OMNIPAQUE IOHEXOL 350 MG/ML SOLN COMPARISON:  None. FINDINGS: Cardiovascular: Cardiomegaly. No filling defects in the pulmonary arteries to suggest pulmonary emboli. No aortic aneurysm. Mediastinum/Nodes: No mediastinal, hilar, or axillary adenopathy. Trachea and esophagus are unremarkable. Thyroid unremarkable. Lungs/Pleura: Moderate right pleural effusion. No confluent airspace opacities. Upper Abdomen: Imaging into the upper abdomen demonstrates no acute findings. Musculoskeletal: Chest wall soft tissues are unremarkable. No acute bony abnormality. Review of the MIP images confirms the  above findings. IMPRESSION: Moderate right pleural effusion. Cardiomegaly. No evidence of pulmonary embolus. Electronically Signed   By: Rolm Baptise M.D.   On: 02/13/2021 17:34    EKG: Independently reviewed.  Assessment/Plan Principal Problem:   New onset of congestive heart failure (HCC) Active Problems:   Atrial fibrillation with RVR (HCC)   Acute HFrEF (heart failure with reduced ejection fraction) (HCC)   AKI (acute kidney injury) ([place])   ETOH abuse    New onset CHF, A.Fib RVR - Seen by cards: gave 1x dose of amiodarone gave 40mg  IV lasix x1 Resume blood thinners after anticoagulated needs cardioversion bedside US = LVEF 20-25% global HK, valves look okay.   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  Needs formal TTE tomorrow. HR 120 after amiodarone dose: dont want to continue amiodarone because they dont want to cardiovert without TEE due to non adherence to [facility].  try coreg if BP will tolerate (BP currently 130/100 in ED so looks like it should) otherwise can tolerate permissive tachycardia at 120. Looks like he is 20lbs volume up Cards to follow CHF pathway Formal 2d echo ordered Tele monitor Daily BMP Holding off on further lasix orders till we see how his kidney responds to the 40mg  IV x1 in ED + IVC dye for CTA Allow permissive tachycardia to 120s, if worsens, call cards Starting coreg 3.125mg  dose tonight then 6.25mg  BID tomorrow since BP currently 130/100 Heparin gtt for the moment NICM - Cards suspects tachy mediated vs EtOH cardiomyopathy Global HK AKI - Mild with creat 1.5 up from 1.2 earlier this month Holding off on further lasix orders till we see how his kidney responds to the 40mg  IV x1 in ED + IVC dye for CTA EtOH abuse - CIWA  DVT prophylaxis: Heparin GTT Code Status: Full Family Communication: Wife at bedside Disposition Plan: Home after cardiac work up and treatment for HFrEF, a.fib RVR Consults called: Cards Admission status: Admit to inpatient  Severity of Illness: The appropriate patient status for this patient is INPATIENT. Inpatient status is judged to be reasonable and necessary in order to provide the required intensity of service to ensure the patient's safety. The patient's presenting symptoms, physical exam findings, and initial radiographic and laboratory data in the context of their chronic comorbidities is felt to place them at high risk for further clinical deterioration. Furthermore, it is not anticipated that the patient will be medically stable for discharge from the hospital within 2 midnights of admission.   Patient has acute decompensated CHF: Patient presents with: dyspnea on exertion /  increased shortness of breath Exam findings include: bilateral leg edema and tachycardia Work up findings include: pleural effusion on CXR Patient is being treated with IV diuretics.   * I certify that at the point of admission it is my clinical judgment that the patient will require inpatient hospital care spanning beyond 2 midnights from the point of admission due to high intensity of service, high risk for further deterioration and high frequency of surveillance required.*   Shefali Ng M. DO [facility]  How to contact the [facility] Attending or Consulting provider [place] or covering provider during after hours [place], for this patient?  Check the care team in [facility] and look for a) attending/consulting [facility] provider listed and b) the [facility] team listed Log into [email or web address]  Amion Physician Scheduling and messaging for groups and whole hospitals  On call and physician scheduling software for group practices, residents, hospitalists and other medical providers  for call, clinic, rotation and shift schedules. On[employer] is a hospital-wide system for scheduling doctors and paging doctors on call. EasyPlot is for scientific plotting and data analysis.  [email or web address]  and use [place]'s universal password to access. If you do not have the password, please contact the hospital operator.  Locate the [facility] provider you are looking for under [facility] and page to a number that you can be directly reached. If you still have difficulty reaching the provider, please page the [facility] (Director on Call) for the Hospitalists listed on amion for assistance.  02/13/2021, 7:46 PM

## 2021-02-13 NOTE — Plan of Care (Signed)
Called by ED regarding Eddie Dixon. Unable to control rates and started on amiodarone gtt POCUS with LVEF 20-25%, global HK, well functioning valves without regurg, no PCE. C/w tachy mediated vs EtOH cardiomyopathy Appears at least 20 pounds volume up Recommended stopping amiodarone gtt since he has Afib of unknown duration and has inconsistent AC so needs to clear LAA before electrical or chemical cardioversion Need to avoid cardizem given HFrEF. Since he is warm metoprolol or coreg is okay but reasonable to allow permissive tachycardia for now TEE/DCCV once euvolemic Recommended ED give 40 mg Iv lasix Formal TTE tomorrow Admit to hospitalist service, we will continue to follow

## 2021-02-13 NOTE — ED Triage Notes (Addendum)
Pt sent from [facility] for Afib RVR.  States he went there due to cough x 1 month and was told to come to ED due to Afib.  Pt states he had a physical 1 1/2 weeks ago and was diagnosed with afib.  Followed up with cardiologist and started taking blood thinner (unsure of name) and HTN meds.  States he stopped taking them a couple days ago due to cost of blood thinner and the way the BP medication made him feel.  Also reports swelling to ankles for a few days.    Denies chest pain. Reports SOB x 3 weeks.

## 2021-02-13 NOTE — ED Provider Notes (Signed)
[facility] EMERGENCY DEPARTMENT Provider Note   CSN: [id number] Arrival date & time: 02/13/21  1437     History  Chief Complaint  Patient presents with   Atrial Fibrillation    Eddie Dixon is a 56 y.o. male.  HPI     56yo male with history of anxiety, premature family history of CAD, recent diagnosis of atrial fibrillation with RVR seen by Dr. Gwenlyn Found Cardiology on 01/28/2021, started on metoprolol by PCP on 1/4 which was increased on 1/5 and started on xarelto who presents from urgent care with concern for atrial fibrillation with RVR and leg swelling.  He decreased and then stopped taking the metoprolol 3 days ago because he felt it was giving him stomach cramps.  Since stopping the medication, reports that the abdominal cramping has improved.  He ran out of his Xarelto on Tuesday.  For the last 2 days, he has developed swelling in his bilateral lower extremities, feels that the right leg is worse than the left.  Reports he has had a dry cough, occasionally productive of mucus for the past month and a half, however did not really feel shortness of breath develop until after he began the medications on January 4.  Reports he has had progressive shortness of breath since that time.  It worsens with exertion, specifically going up the stairs.  He initially denies feeling orthopnea, but his wife reports that he then confirms that he has had more shortness of breath when he lays all the way flat, as well as has been waking up in the middle of the night with shortness of breath.  Denies any chest pain.  Feels intermittent palpitations.  Denies any significant lightheadedness, syncope.  Denies nausea, vomiting, diarrhea, black or bloody stools.  No known fevers.  Denies any history of DVT or PE, family history of DVT or PE, recent travel or immobilization, recent surgeries.  Chewing tobacco, no smoking, etoh 1 beer 4 times weekly, no other drugs  Past Medical History:   Diagnosis Date   A-fib ([place])    Anxiety    ED (erectile dysfunction)    Elevated blood pressure      History reviewed. No pertinent surgical history.   Home Medications Prior to Admission medications   Medication Sig Start Date End Date Taking? Authorizing Provider  Cholecalciferol (VITAMIN D3) 50 MCG (2000 UT) capsule Take 1 capsule (2,000 Units total) by mouth daily. 01/27/21  Yes Plotnikov, Evie Lacks, MD  Multiple Vitamins-Minerals (MULTIVITAMIN ADULTS) TABS Take 1 tablet by mouth daily.   Yes [provider]  rivaroxaban (XARELTO) 20 MG TABS tablet Take 1 tablet (20 mg total) by mouth daily with supper. 02/12/21  Yes Lorretta Harp, MD  traZODone (DESYREL) 50 MG tablet Take 0.5-1 tablets (25-50 mg total) by mouth at bedtime as needed for sleep. 11/16/20  Yes Plotnikov, Evie Lacks, MD  metoprolol succinate (TOPROL-XL) 50 MG 24 hr tablet Take 0.5 tablets (25 mg total) by mouth in the morning and at bedtime. 02/04/21   Plotnikov, Evie Lacks, MD  tadalafil (CIALIS) 5 MG tablet Take 1 tablet (5 mg total) by mouth daily. Patient not taking: Reported on 02/13/2021 11/16/20   Plotnikov, Evie Lacks, MD      Allergies    Patient has no known allergies.    Review of Systems   Review of Systems SEE ABOVE  Physical Exam Updated Vital Signs BP (!) 130/112    Pulse (!) 132    Temp 98 F (  36.7 C)    Resp (!) 25    SpO2 95%  Physical Exam Vitals and nursing note reviewed.  Constitutional:      General: He is not in acute distress.    Appearance: He is well-developed. He is not diaphoretic.  HENT:     Head: Normocephalic and atraumatic.  Eyes:     Conjunctiva/sclera: Conjunctivae normal.  Cardiovascular:     Rate and Rhythm: Tachycardia present. Rhythm irregular.     Heart sounds: Normal heart sounds. No murmur heard.   No friction rub. No gallop.  Pulmonary:     Effort: Pulmonary effort is normal. No respiratory distress.     Breath sounds: Normal breath sounds. No wheezing or  rales.  Abdominal:     General: There is no distension.     Palpations: Abdomen is soft.     Tenderness: There is no abdominal tenderness. There is no guarding.  Musculoskeletal:     Cervical back: Normal range of motion.     Right lower leg: Edema present.     Left lower leg: Edema present.  Skin:    General: Skin is warm and dry.  Neurological:     Mental Status: He is alert and oriented to person, place, and time.    ED Results / Procedures / Treatments   Labs (all labs ordered are listed, but only abnormal results are displayed) Labs Reviewed  BASIC METABOLIC PANEL - Abnormal; Notable for the following components:      Result Value   Glucose, Bld 116 (*)    Creatinine, Ser 1.52 (*)    GFR, Estimated 54 (*)    All other components within normal limits  D-DIMER, QUANTITATIVE - Abnormal; Notable for the following components:   D-Dimer, Quant 0.97 (*)    All other components within normal limits  HEPATIC FUNCTION PANEL - Abnormal; Notable for the following components:   Total Protein 6.0 (*)    AST 47 (*)    ALT 68 (*)    Total Bilirubin 1.3 (*)    Bilirubin, Direct 0.3 (*)    Indirect Bilirubin 1.0 (*)    All other components within normal limits  BRAIN NATRIURETIC PEPTIDE - Abnormal; Notable for the following components:   B Natriuretic Peptide 1,340.6 (*)    All other components within normal limits  TROPONIN I (HIGH SENSITIVITY) - Abnormal; Notable for the following components:   Troponin I (High Sensitivity) 32 (*)    All other components within normal limits  RESP PANEL BY RT-PCR (FLU A&B, COVID) ARPGX2  CBC  PROTIME-INR  LACTIC ACID, PLASMA  LACTIC ACID, PLASMA  HIV ANTIBODY (ROUTINE TESTING W REFLEX)  BASIC METABOLIC PANEL  APTT  HEPARIN LEVEL (UNFRACTIONATED)  CBC  TROPONIN I (HIGH SENSITIVITY)    EKG EKG Interpretation  Date/Time:  Saturday February 13 2021 14:41:27 EST Ventricular Rate:  153 PR Interval:    QRS Duration: 70 QT  Interval:  256 QTC Calculation: 408 R Axis:   -58 Text Interpretation: Atrial fibrillation with rapid ventricular response Left axis deviation Low voltage QRS Possible Anterolateral infarct , age undetermined Abnormal ECG No previous ECGs available Confirmed by Gareth Morgan [id number]) on 02/13/2021 3:13:22 PM  Radiology DG Chest 2 View  Result Date: 02/13/2021 CLINICAL DATA:  Atrial fibrillation EXAM: CHEST - 2 VIEW COMPARISON:  None. FINDINGS: Cardiomegaly noted without CHF. Increased basilar streaky opacities favored to be atelectasis. No definite focal pneumonia, collapse or consolidation. No large effusion or pneumothorax. Trachea midline. Minor  endplate degenerative changes of the spine. Monitor leads overlie the chest. IMPRESSION: Cardiomegaly and bibasilar atelectasis. Electronically Signed   By: Jerilynn Mages.  Shick M.D.   On: 02/13/2021 16:07   CT Angio Chest PE W and/or Wo Contrast  Result Date: 02/13/2021 CLINICAL DATA:  Pulmonary embolism (PE) suspected, positive D-dimer EXAM: CT ANGIOGRAPHY CHEST WITH CONTRAST TECHNIQUE: Multidetector CT imaging of the chest was performed using the standard protocol during bolus administration of intravenous contrast. Multiplanar CT image reconstructions and MIPs were obtained to evaluate the vascular anatomy. RADIATION DOSE REDUCTION: This exam was performed according to the departmental dose-optimization program which includes automated exposure control, adjustment of the mA and/or kV according to patient size and/or use of iterative reconstruction technique. CONTRAST:  4mL OMNIPAQUE IOHEXOL 350 MG/ML SOLN COMPARISON:  None. FINDINGS: Cardiovascular: Cardiomegaly. No filling defects in the pulmonary arteries to suggest pulmonary emboli. No aortic aneurysm. Mediastinum/Nodes: No mediastinal, hilar, or axillary adenopathy. Trachea and esophagus are unremarkable. Thyroid unremarkable. Lungs/Pleura: Moderate right pleural effusion. No confluent airspace opacities. Upper  Abdomen: Imaging into the upper abdomen demonstrates no acute findings. Musculoskeletal: Chest wall soft tissues are unremarkable. No acute bony abnormality. Review of the MIP images confirms the above findings. IMPRESSION: Moderate right pleural effusion. Cardiomegaly. No evidence of pulmonary embolus. Electronically Signed   By: Rolm Baptise M.D.   On: 02/13/2021 17:34    Procedures .Critical Care Performed by: Gareth Morgan, MD Authorized by: Gareth Morgan, MD   Critical care provider statement:    Critical care time (minutes):  30   Critical care was time spent personally by me on the following activities:  Development of treatment plan with patient or surrogate, discussions with consultants, evaluation of patient's response to treatment, examination of patient, ordering and review of laboratory studies, ordering and review of radiographic studies, ordering and performing treatments and interventions, pulse oximetry, re-evaluation of patient's condition and review of old charts    Medications Ordered in ED Medications  sodium chloride flush (NS) 0.9 % injection 3 mL (0 mLs Intravenous Hold 02/13/21 2125)  sodium chloride flush (NS) 0.9 % injection 3 mL (has no administration in time range)  0.9 %  sodium chloride infusion (has no administration in time range)  acetaminophen (TYLENOL) tablet 650 mg (has no administration in time range)  ondansetron (ZOFRAN) injection 4 mg (has no administration in time range)  LORazepam (ATIVAN) tablet 1-4 mg (has no administration in time range)    Or  LORazepam (ATIVAN) injection 1-4 mg (has no administration in time range)  thiamine tablet 100 mg (100 mg Oral Given 02/13/21 2057)    Or  thiamine (B-1) injection 100 mg ( Intravenous See Alternative [id number])  folic acid (FOLVITE) tablet 1 mg (1 mg Oral Given 02/13/21 2058)  multivitamin with minerals tablet 1 tablet (1 tablet Oral Given 02/13/21 2058)  heparin ADULT infusion 100 units/mL (25000  units/257mL) (1,300 Units/hr Intravenous New Bag/Given 02/13/21 2114)  carvedilol (COREG) tablet 6.25 mg (has no administration in time range)  amiodarone (NEXTERONE) 1.8 mg/mL load via infusion 150 mg (150 mg Intravenous Bolus from Bag 02/13/21 1628)  iohexol (OMNIPAQUE) 350 MG/ML injection 80 mL (80 mLs Intravenous Contrast Given 02/13/21 1726)  furosemide (LASIX) injection 40 mg (40 mg Intravenous Given 02/13/21 1900)  carvedilol (COREG) tablet 3.125 mg (3.125 mg Oral Given 02/13/21 2331)    ED Course/ Medical Decision Making/ A&P  Medical Decision Making Amount and/or Complexity of Data Reviewed Labs: ordered. Radiology: ordered.  Risk Prescription drug management. Decision regarding hospitalization.    56yo male with history of anxiety, premature family history of CAD, recent diagnosis of atrial fibrillation with RVR seen by Dr. Gwenlyn Found Cardiology on 01/28/2021, started on metoprolol by PCP on 1/4 which was increased on 1/5 and started on xarelto who presents from urgent care with concern for atrial fibrillation with RVR and leg swelling.  He decreased and then stopped taking the metoprolol 3 days ago because he felt it was giving him stomach cramps.  Since stopping the medication, reports that the abdominal cramping has improved.  He ran out of his Xarelto on Tuesday.   Labs obtained and evaluated by me-show normal hgb, normal WBC, INR WNL.  Cr 1.52 from 1.27 01/19/21.  Arrives in atrial fibrillation with RVR rate in 150s, signs of volume overload on exam with bilateral peripheral edema.  Given concern for CHF and atrial fibrillation with intolerance of metoprolol, discussed with Dr. Johnsie Cancel and ordered amiodarone bolus and gtt.  DDimer returned positive. CT PE study ordered to evaluate for this as possible cause of afib, dyspnea, tachycardia, shows no evidence of PE, does show pleural effusion. BNP 1340, consistent with CHF secondary to tachycardia. Troponin mild  elevation 32 likely secondary to strain.  40mg  lasix ordered with concern for CHF.  Consulted Cardiology Dr. Gaynelle Arabian. Discontinued amiodarone given inconsistent anticoagulation, will continue to avoid diltiazem given heart failure.  For now will allow permissive tachycardia with rate improved to 120.  Will admit for continued care of new CHF and atrial fibrillation with RVR. Discussed with hospitalist.          Final Clinical Impression(s) / ED Diagnoses Final diagnoses:  Atrial fibrillation with RVR ([place])  Acute systolic congestive heart failure [facility])    Rx / DC Orders ED Discharge Orders     None         Gareth Morgan, MD 02/14/21 [phone]

## 2021-02-13 NOTE — Progress Notes (Signed)
ANTICOAGULATION CONSULT NOTE - Initial Consult  Pharmacy Consult for heparin Indication: atrial fibrillation  No Known Allergies  Patient Measurements:   Heparin Dosing Weight: 91kg  Vital Signs: Temp: 98 F (36.7 C) (01/21 1443) BP: 100/89 (01/21 1800) Pulse Rate: 120 (01/21 1800)  Labs: Recent Labs    02/13/21 1446 02/13/21 1533  HGB 16.3  --   HCT 50.2  --   PLT 274  --   LABPROT 14.5  --   INR 1.1  --   CREATININE 1.52*  --   TROPONINIHS  --  32*    CrCl cannot be calculated (Unknown ideal weight.).   Medical History: Past Medical History:  Diagnosis Date   Anxiety    ED (erectile dysfunction)    Elevated blood pressure     Assessment: 63 YOM presenting with afib, he is on Xarelto PTA however has not had a dose since 1/18, recent clinic note states co-pay issues.  CBC wnl  Goal of Therapy:  Heparin level 0.3-0.7 units/ml Monitor platelets by anticoagulation protocol: Yes   Plan:  Heparin gtt at 1300 units/hr F/u 6 hour aPTT/HL F/u cards plans and ability to transition back to PO  Daylene Posey, PharmD Clinical Pharmacist ED Pharmacist Phone # [phone] 02/13/2021 7:37 PM

## 2021-02-14 ENCOUNTER — Inpatient Hospital Stay (HOSPITAL_COMMUNITY): Payer: Managed Care, Other (non HMO)

## 2021-02-14 DIAGNOSIS — I509 Heart failure, unspecified: Secondary | ICD-10-CM

## 2021-02-14 DIAGNOSIS — I4891 Unspecified atrial fibrillation: Secondary | ICD-10-CM

## 2021-02-14 LAB — CBC
HCT: 46.5 % (ref 39.0–52.0)
Hemoglobin: 14.9 g/dL (ref 13.0–17.0)
MCH: 30.2 pg (ref 26.0–34.0)
MCHC: 32 g/dL (ref 30.0–36.0)
MCV: 94.1 fL (ref 80.0–100.0)
Platelets: 247 10*3/uL (ref 150–400)
RBC: 4.94 MIL/uL (ref 4.22–5.81)
RDW: 12.1 % (ref 11.5–15.5)
WBC: 4.2 10*3/uL (ref 4.0–10.5)
nRBC: 0 % (ref 0.0–0.2)

## 2021-02-14 LAB — BASIC METABOLIC PANEL
Anion gap: 10 (ref 5–15)
BUN: 19 mg/dL (ref 6–20)
CO2: 23 mmol/L (ref 22–32)
Calcium: 8.8 mg/dL — ABNORMAL LOW (ref 8.9–10.3)
Chloride: 108 mmol/L (ref 98–111)
Creatinine, Ser: 1.51 mg/dL — ABNORMAL HIGH (ref 0.61–1.24)
GFR, Estimated: 55 mL/min — ABNORMAL LOW (ref >60)
Glucose, Bld: 107 mg/dL — ABNORMAL HIGH (ref 70–99)
Potassium: 4.2 mmol/L (ref 3.5–5.1)
Sodium: 141 mmol/L (ref 135–145)

## 2021-02-14 LAB — HEPARIN LEVEL (UNFRACTIONATED): Heparin Unfractionated: 0.14 IU/mL — ABNORMAL LOW (ref 0.30–0.70)

## 2021-02-14 LAB — ECHOCARDIOGRAM COMPLETE
AR max vel: 1.86 cm2
AV Area VTI: 1.93 cm2
AV Area mean vel: 1.65 cm2
AV Mean grad: 2 mmHg
AV Peak grad: 3 mmHg
Ao pk vel: 0.86 m/s
S' Lateral: 4.7 cm

## 2021-02-14 LAB — APTT: aPTT: 41 seconds — ABNORMAL HIGH (ref 24–36)

## 2021-02-14 MED ORDER — PERFLUTREN LIPID MICROSPHERE
1.0000 mL | INTRAVENOUS | Status: AC | PRN
  Administered 2021-02-14: 2 mL via INTRAVENOUS
  Filled 2021-02-14: qty 10

## 2021-02-14 MED ORDER — CARVEDILOL 12.5 MG PO TABS
12.5000 mg | ORAL_TABLET | Freq: Two times a day (BID) | ORAL | Status: DC
  Administered 2021-02-14 – 2021-02-16 (×4): 12.5 mg via ORAL
  Filled 2021-02-14 (×4): qty 1

## 2021-02-14 MED ORDER — RIVAROXABAN 20 MG PO TABS
20.0000 mg | ORAL_TABLET | Freq: Every day | ORAL | Status: DC
  Administered 2021-02-14 – 2021-02-16 (×3): 20 mg via ORAL
  Filled 2021-02-14: qty 2
  Filled 2021-02-14 (×2): qty 1

## 2021-02-14 MED ORDER — TRAZODONE HCL 50 MG PO TABS
25.0000 mg | ORAL_TABLET | Freq: Every evening | ORAL | Status: DC | PRN
  Administered 2021-02-15 – 2021-02-22 (×7): 25 mg via ORAL
  Filled 2021-02-14 (×7): qty 1

## 2021-02-14 MED ORDER — SODIUM CHLORIDE 0.9 % IV SOLN: INTRAVENOUS | Status: DC

## 2021-02-14 MED ORDER — METOPROLOL TARTRATE 5 MG/5ML IV SOLN
5.0000 mg | Freq: Once | INTRAVENOUS | Status: AC
  Administered 2021-02-14: 5 mg via INTRAVENOUS
  Filled 2021-02-14: qty 5

## 2021-02-14 MED ORDER — RIVAROXABAN 10 MG PO TABS: 20.0000 mg | ORAL_TABLET | Freq: Every day | ORAL | Status: DC

## 2021-02-14 NOTE — Progress Notes (Signed)
ANTICOAGULATION CONSULT NOTE - Initial Consult  Pharmacy Consult for heparin Indication: atrial fibrillation  No Known Allergies  Patient Measurements:   Heparin Dosing Weight: 91kg  Vital Signs: BP: 115/101 (01/22 0730) Pulse Rate: 117 (01/22 0730)  Labs: Recent Labs    02/13/21 1446 02/13/21 1533 02/14/21 0727  HGB 16.3  --  14.9  HCT 50.2  --  46.5  PLT 274  --  247  APTT  --   --  41*  LABPROT 14.5  --   --   INR 1.1  --   --   HEPARINUNFRC  --   --  0.14*  CREATININE 1.52*  --   --   TROPONINIHS  --  32*  --      CrCl cannot be calculated (Unknown ideal weight.).   Medical History: Past Medical History:  Diagnosis Date   A-fib [facility])    Anxiety    ED (erectile dysfunction)    Elevated blood pressure     Assessment: 76 YOM presenting with afib, he is on Xarelto PTA however has not had a dose since 1/18, recent clinic note states co-pay issues.  CBC wnl  Heparin level 0.14, aPTT 41 (on heparin 1300 units/hr) No signs/symptoms of bleed Nurse reports issues with line, placed a new line around 7:30 this am.  Goal of Therapy:  Heparin level 0.3-0.7 units/ml Monitor platelets by anticoagulation protocol: Yes   Plan:  Continue heparin drip at 1300 Obtain repeat heparin level at 1200 to guide further therapy F/u cards plans and ability to transition back to PO  Thank you for allowing pharmacy to be a part of this patients care.  Donnald Garre, PharmD Clinical Pharmacist  Please check AMION for all [place] numbers After 10:00 PM, call [place] [phone]

## 2021-02-14 NOTE — Progress Notes (Signed)
° °  Primary Cardiologist:  Allyson Sabal  Subjective:  Feeling better less dyspnea rates still high   Objective:  Vitals:   02/14/21 0400 02/14/21 0417 02/14/21 0730 02/14/21 0800  BP: (!) 137/123  (!) 115/101 (!) 125/96  Pulse: 98 89 (!) 117 64  Resp: 14 16 14  (!) 25  Temp:      SpO2: 94% 90% 97% 98%    Intake/Output from previous day: No intake or output data in the 24 hours ending 02/14/21 0916  Physical Exam: Affect appropriate Healthy:  appears stated age HEENT: normal Neck supple with no adenopathy JVP normal no bruits no thyromegaly Lungs clear with no wheezing and good diaphragmatic motion Heart:  S1/S2 no murmur, no rub, gallop or click PMI normal Abdomen: benighn, BS positve, no tenderness, no AAA no bruit.  No HSM or HJR Distal pulses intact with no bruits Trace LE edema Neuro non-focal Skin warm and dry No muscular weakness   Lab Results: Basic Metabolic Panel: Recent Labs    02/13/21 1446 02/14/21 0727  NA 139 141  K 5.1 4.2  CL 108 108  CO2 22 23  GLUCOSE 116* 107*  BUN 19 19  CREATININE 1.52* 1.51*  CALCIUM 9.3 8.8*   Liver Function Tests: Recent Labs    02/13/21 1533  AST 47*  ALT 68*  ALKPHOS 46  BILITOT 1.3*  PROT 6.0*  ALBUMIN 3.7   No results for input(s): LIPASE, AMYLASE in the last 72 hours. CBC: Recent Labs    02/13/21 1446 02/14/21 0727  WBC 4.8 4.2  HGB 16.3 14.9  HCT 50.2 46.5  MCV 94.7 94.1  PLT 274 247    D-Dimer: Recent Labs    02/13/21 1533  DDIMER 0.97*     Cardiac Studies:  ECG: afib rate 153 low voltage    Telemetry: afib/flutter rate 120   Echo: pending   Medications:    carvedilol  6.25 mg Oral BID WC   folic acid  1 mg Oral Daily   multivitamin with minerals  1 tablet Oral Daily   sodium chloride flush  3 mL Intravenous Q12H   thiamine  100 mg Oral Daily   Or   thiamine  100 mg Intravenous Daily      sodium chloride     heparin 1,300 Units/hr (02/14/21 0731)    Assessment/Plan:  Afib:   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 Increase coreg to 12.5 bid He had taken xarelto intermittently but not since Wednesday. He has been on heparin since yesterday Will start back Xarelto If echo shows low if from tachycardia and ETOH consider TEE [facility] Tuesday Received a dose of lasix in ER Sats are good Cardizem d/c as it made his stomach feel horrible and concern for low EF    Monday 02/14/2021, 9:16 AM

## 2021-02-14 NOTE — Progress Notes (Signed)
°  Echocardiogram 2D Echocardiogram with contrast has been performed.  Roosvelt Maser F 02/14/2021, 4:11 PM

## 2021-02-14 NOTE — Progress Notes (Signed)
PROGRESS NOTE    Eddie Dixon  [id number] DOB: [birth year] DOA: 02/13/2021 PCP: Cassandria Anger, MD   Brief Narrative:  Eddie Dixon is a 56 y.o. male with medical history significant of EtOH abuse and new diagnosis of A.Fib recently, saw Dr. Gwenlyn Found 1/5, started initially on cardizem and xarelto.  Cardizem made him feel sick so he quit taking this.  Plotnikov started him on metoprolol which he quit taking because he felt it gave him stomach cramps.He also stopped taking Xarelto a couple of days ago due to cost.  Presented to ED with ongoing A.Fib RVR, and peripheral edema in legs worsening over the past couple of days as well as weight gain of 15 to 20 pounds.  Upon arrival to ED, he was in A. fib with RVR with rates around 150.  Soft BPs.  CTA chest negative for PE.  Creatinine up to 1.5.  Admitted to hospitalist and cardiology consulted.  Assessment & Plan:   Principal Problem:   New onset of congestive heart failure (HCC) Active Problems:   Atrial fibrillation with RVR (HCC)   Acute HFrEF (heart failure with reduced ejection fraction) (HCC)   AKI (acute kidney injury) ([place])   ETOH abuse  Atrial fibrillation with RVR: Patient's rates around 130 when I saw him this morning but he was totally asymptomatic other than cough.  I see that cardiology has increased his Coreg to 12.5 mg twice daily starting tonight.  He has been transition to [place] as well.  Appreciate cardiology help managing this.  Acute systolic congestive heart failure: Patient was not hypoxic but did have some shortness of breath.  Chest x-ray negative for pulmonary edema but bedside echo shows 20% ejection fraction.  Formal transthoracic echo pending.  Cardiology managing.  Received 1 dose of Lasix yesterday, no further Lasix per cardiology.  Management per them.  AKI: Baseline creatinine around 1.1 but presented with 1.5 and currently stable.  Monitor closely.  Chronic alcoholism: Patient himself told me  that he drinks about 2 drinks of bourbon daily but also gave me a contradictory statement that he did not drink for the whole week.  Per cardiology note, his wife had informed them that patient drinks a whole bottle of bourbon every day.  Patient denied having any withdrawal symptoms currently or in the past or any hospitalizations either.  I highly doubt that history.  We will monitor him closely with CIWA protocol and as needed Ativan.  DVT prophylaxis: rivaroxaban (XARELTO) tablet 20 mg Start: 02/14/21 1700   Code Status: Full Code  Family Communication:  None present at bedside.  Plan of care discussed with patient in length and he/she verbalized understanding and agreed with it.  Status is: Inpatient  Remains inpatient appropriate because: Needs management of A. fib with RVR       Estimated body mass index is 34.85 kg/m as calculated from the following:   Height as of 01/28/21: 5\' 9"  (1.753 m).   Weight as of 01/28/21: 107 kg.    Nutritional Assessment: There is no height or weight on file to calculate BMI.. Seen by dietician.  I agree with the assessment and plan as outlined below: Nutrition Status:        . Skin Assessment: I have examined the patient's skin and I agree with the wound assessment as performed by the wound care RN as outlined below:    Consultants:  Cardiology  Procedures:  None  Antimicrobials:  Anti-infectives (From admission, onward)  None         Subjective: Seen and examined in the ED.  Complains of cough.  Denies any palpitation, chest pain or shortness of breath.  Objective: Vitals:   02/14/21 0730 02/14/21 0800 02/14/21 0900 02/14/21 0923  BP: (!) 115/101 (!) 125/96 107/74   Pulse: (!) 117 64 (!) 128   Resp: 14 (!) 25 (!) 25   Temp:    (!) 97.5 F (36.4 C)  TempSrc:    Oral  SpO2: 97% 98% 97%    No intake or output data in the 24 hours ending 02/14/21 1002 There were no vitals filed for this visit.  Examination:  General  exam: Appears calm and comfortable  Respiratory system: Clear to auscultation. Respiratory effort normal. Cardiovascular system: S1 & S2 heard, irregularly irregular rate and rhythm. No JVD, murmurs, rubs, gallops or clicks.  +1 pitting edema bilateral lower extremity. Gastrointestinal system: Abdomen is nondistended, soft and nontender. No organomegaly or masses felt. Normal bowel sounds heard. Central nervous system: Alert and oriented. No focal neurological deficits. Extremities: Symmetric 5 x 5 power. Skin: No rashes, lesions or ulcers Psychiatry: Judgement and insight appear normal. Mood & affect appropriate.    Data Reviewed: I have personally reviewed following labs and imaging studies  CBC: Recent Labs  Lab 02/13/21 1446 02/14/21 0727  WBC 4.8 4.2  HGB 16.3 14.9  HCT 50.2 46.5  MCV 94.7 94.1  PLT 274 [id number]   Basic Metabolic Panel: Recent Labs  Lab 02/13/21 1446 02/14/21 0727  NA 139 141  K 5.1 4.2  CL 108 108  CO2 22 23  GLUCOSE 116* 107*  BUN 19 19  CREATININE 1.52* 1.51*  CALCIUM 9.3 8.8*   GFR: CrCl cannot be calculated (Unknown ideal weight.). Liver Function Tests: Recent Labs  Lab 02/13/21 1533  AST 47*  ALT 68*  ALKPHOS 46  BILITOT 1.3*  PROT 6.0*  ALBUMIN 3.7   No results for input(s): LIPASE, AMYLASE in the last 168 hours. No results for input(s): AMMONIA in the last 168 hours. Coagulation Profile: Recent Labs  Lab 02/13/21 1446  INR 1.1   Cardiac Enzymes: No results for input(s): CKTOTAL, CKMB, CKMBINDEX, TROPONINI in the last 168 hours. BNP (last 3 results) No results for input(s): PROBNP in the last 8760 hours. HbA1C: No results for input(s): HGBA1C in the last 72 hours. CBG: No results for input(s): GLUCAP in the last 168 hours. Lipid Profile: No results for input(s): CHOL, HDL, LDLCALC, TRIG, CHOLHDL, LDLDIRECT in the last 72 hours. Thyroid Function Tests: No results for input(s): TSH, T4TOTAL, FREET4, T3FREE, THYROIDAB in the  last 72 hours. Anemia Panel: No results for input(s): VITAMINB12, FOLATE, FERRITIN, TIBC, IRON, RETICCTPCT in the last 72 hours. Sepsis Labs: No results for input(s): PROCALCITON, LATICACIDVEN in the last 168 hours.  Recent Results (from the past 240 hour(s))  Resp Panel by RT-PCR (Flu A&B, Covid) Nasopharyngeal Swab     Status: None   Collection Time: 02/13/21  9:02 PM   Specimen: Nasopharyngeal Swab; Nasopharyngeal(NP) swabs in vial transport medium  Result Value Ref Range Status   SARS Coronavirus 2 by RT PCR NEGATIVE NEGATIVE Final    Comment: (NOTE) SARS-CoV-2 target nucleic acids are NOT DETECTED.  The SARS-CoV-2 RNA is generally detectable in upper respiratory specimens during the acute phase of infection. The lowest concentration of SARS-CoV-2 viral copies this assay can detect is 138 copies/mL. A negative result does not preclude SARS-Cov-2 infection and should not be used as the  sole basis for treatment or other patient management decisions. A negative result may occur with  improper specimen collection/handling, submission of specimen other than nasopharyngeal swab, presence of viral mutation(s) within the areas targeted by this assay, and inadequate number of viral copies(<138 copies/mL). A negative result must be combined with clinical observations, patient history, and epidemiological information. The expected result is Negative.  Fact Sheet for Patients:  EntrepreneurPulse.com.au  Fact Sheet for Healthcare Providers:  IncredibleEmployment.be  This test is no t yet approved or cleared by the [place] FDA and  has been authorized for detection and/or diagnosis of SARS-CoV-2 by FDA under an Emergency Use Authorization (EUA). This EUA will remain  in effect (meaning this test can be used) for the duration of the COVID-19 declaration under Section 564(b)(1) of the Act, 21 U.S.C.section 360bbb-3(b)(1), unless the authorization is  terminated  or revoked sooner.       Influenza A by PCR NEGATIVE NEGATIVE Final   Influenza B by PCR NEGATIVE NEGATIVE Final    Comment: (NOTE) The Xpert Xpress SARS-CoV-2/FLU/RSV plus assay is intended as an aid in the diagnosis of influenza from Nasopharyngeal swab specimens and should not be used as a sole basis for treatment. Nasal washings and aspirates are unacceptable for Xpert Xpress SARS-CoV-2/FLU/RSV testing.  Fact Sheet for Patients: EntrepreneurPulse.com.au  Fact Sheet for Healthcare Providers: IncredibleEmployment.be  This test is not yet approved or cleared by the [place] FDA and has been authorized for detection and/or diagnosis of SARS-CoV-2 by FDA under an Emergency Use Authorization (EUA). This EUA will remain in effect (meaning this test can be used) for the duration of the COVID-19 declaration under Section 564(b)(1) of the Act, 21 U.S.C. section 360bbb-3(b)(1), unless the authorization is terminated or revoked.  Performed at [facility], [place] [address] [place], [place] [address]      Radiology Studies: DG Chest 2 View  Result Date: 02/13/2021 CLINICAL DATA:  Atrial fibrillation EXAM: CHEST - 2 VIEW COMPARISON:  None. FINDINGS: Cardiomegaly noted without CHF. Increased basilar streaky opacities favored to be atelectasis. No definite focal pneumonia, collapse or consolidation. No large effusion or pneumothorax. Trachea midline. Minor endplate degenerative changes of the spine. Monitor leads overlie the chest. IMPRESSION: Cardiomegaly and bibasilar atelectasis. Electronically Signed   By: Jerilynn Mages.  Shick M.D.   On: 02/13/2021 16:07   CT Angio Chest PE W and/or Wo Contrast  Result Date: 02/13/2021 CLINICAL DATA:  Pulmonary embolism (PE) suspected, positive D-dimer EXAM: CT ANGIOGRAPHY CHEST WITH CONTRAST TECHNIQUE: Multidetector CT imaging of the chest was performed using the standard protocol during bolus  administration of intravenous contrast. Multiplanar CT image reconstructions and MIPs were obtained to evaluate the vascular anatomy. RADIATION DOSE REDUCTION: This exam was performed according to the departmental dose-optimization program which includes automated exposure control, adjustment of the mA and/or kV according to patient size and/or use of iterative reconstruction technique. CONTRAST:  22mL OMNIPAQUE IOHEXOL 350 MG/ML SOLN COMPARISON:  None. FINDINGS: Cardiovascular: Cardiomegaly. No filling defects in the pulmonary arteries to suggest pulmonary emboli. No aortic aneurysm. Mediastinum/Nodes: No mediastinal, hilar, or axillary adenopathy. Trachea and esophagus are unremarkable. Thyroid unremarkable. Lungs/Pleura: Moderate right pleural effusion. No confluent airspace opacities. Upper Abdomen: Imaging into the upper abdomen demonstrates no acute findings. Musculoskeletal: Chest wall soft tissues are unremarkable. No acute bony abnormality. Review of the MIP images confirms the above findings. IMPRESSION: Moderate right pleural effusion. Cardiomegaly. No evidence of pulmonary embolus. Electronically Signed   By: Rolm Baptise M.D.  On: 02/13/2021 17:34    Scheduled Meds:  carvedilol  12.5 mg Oral BID WC   folic acid  1 mg Oral Daily   multivitamin with minerals  1 tablet Oral Daily   rivaroxaban  20 mg Oral Q supper   sodium chloride flush  3 mL Intravenous Q12H   thiamine  100 mg Oral Daily   Or   thiamine  100 mg Intravenous Daily   Continuous Infusions:  sodium chloride       LOS: 1 day   Time spent: 35 minutes  Darliss Cheney, MD [facility]  02/14/2021, 10:02 AM  Please page via [place] and do not message via secure chat for anything urgent. Secure chat can be used for anything non urgent.  How to contact the [facility] Attending or Consulting provider [place] or covering provider during after hours [place], for this patient?  Check the care team in [facility] and look for a)  attending/consulting [facility] provider listed and b) the [facility] team listed. Page or secure chat [phone]. Log into [email or web address] and use [place]'s universal password to access. If you do not have the password, please contact the hospital operator. Locate the [facility] provider you are looking for under [facility] and page to a number that you can be directly reached. If you still have difficulty reaching the provider, please page the [facility] (Director on Call) for the Hospitalists listed on amion for assistance.

## 2021-02-14 NOTE — ED Notes (Signed)
Pt's HR increased, remaining in 120s-130s from 110s. BP also remaining elevated, 130/112 at 0200. Dr. Julian Reil made aware. Plan to give 5mg  IV metoprolol.

## 2021-02-14 NOTE — Progress Notes (Signed)
Pt may have sleep apnea may need a sleep study at discharge, please address thanks Lavonda Jumbo RN.

## 2021-02-15 ENCOUNTER — Encounter (HOSPITAL_COMMUNITY): Payer: Self-pay | Admitting: Internal Medicine

## 2021-02-15 ENCOUNTER — Ambulatory Visit (HOSPITAL_COMMUNITY): Payer: 59 | Attending: Internal Medicine

## 2021-02-15 DIAGNOSIS — I5020 Unspecified systolic (congestive) heart failure: Secondary | ICD-10-CM | POA: Diagnosis not present

## 2021-02-15 DIAGNOSIS — I4891 Unspecified atrial fibrillation: Secondary | ICD-10-CM | POA: Diagnosis not present

## 2021-02-15 LAB — BASIC METABOLIC PANEL
Anion gap: 6 (ref 5–15)
BUN: 23 mg/dL — ABNORMAL HIGH (ref 6–20)
CO2: 26 mmol/L (ref 22–32)
Calcium: 8.7 mg/dL — ABNORMAL LOW (ref 8.9–10.3)
Chloride: 109 mmol/L (ref 98–111)
Creatinine, Ser: 1.58 mg/dL — ABNORMAL HIGH (ref 0.61–1.24)
GFR, Estimated: 52 mL/min — ABNORMAL LOW (ref >60)
Glucose, Bld: 105 mg/dL — ABNORMAL HIGH (ref 70–99)
Potassium: 4.6 mmol/L (ref 3.5–5.1)
Sodium: 141 mmol/L (ref 135–145)

## 2021-02-15 MED ORDER — LORAZEPAM 1 MG PO TABS: 1.0000 mg | ORAL_TABLET | ORAL | Status: AC | PRN

## 2021-02-15 MED ORDER — LORAZEPAM 2 MG/ML IJ SOLN: 1.0000 mg | INTRAMUSCULAR | Status: AC | PRN

## 2021-02-15 MED ORDER — FUROSEMIDE 10 MG/ML IJ SOLN
40.0000 mg | Freq: Once | INTRAMUSCULAR | Status: AC
  Administered 2021-02-15: 40 mg via INTRAVENOUS
  Filled 2021-02-15: qty 4

## 2021-02-15 MED ORDER — SACUBITRIL-VALSARTAN 24-26 MG PO TABS
1.0000 | ORAL_TABLET | Freq: Two times a day (BID) | ORAL | Status: DC
  Administered 2021-02-15 – 2021-02-16 (×4): 1 via ORAL
  Filled 2021-02-15 (×5): qty 1

## 2021-02-15 MED ORDER — ADULT MULTIVITAMIN W/MINERALS CH
1.0000 | ORAL_TABLET | Freq: Every day | ORAL | Status: DC
  Administered 2021-02-15 – 2021-02-23 (×9): 1 via ORAL
  Filled 2021-02-15 (×8): qty 1

## 2021-02-15 MED ORDER — THIAMINE HCL 100 MG/ML IJ SOLN: 100.0000 mg | Freq: Every day | INTRAMUSCULAR | Status: DC

## 2021-02-15 MED ORDER — FOLIC ACID 1 MG PO TABS: 1.0000 mg | ORAL_TABLET | Freq: Every day | ORAL | Status: DC

## 2021-02-15 MED ORDER — THIAMINE HCL 100 MG PO TABS: 100.0000 mg | ORAL_TABLET | Freq: Every day | ORAL | Status: DC

## 2021-02-15 NOTE — Progress Notes (Signed)
PROGRESS NOTE    Eddie Dixon  YHT:[id number] DOB: 10/17/1965 DOA: 02/13/2021 PCP: Tresa Garter, MD   Brief Narrative:  Eddie Dixon is a 56 y.o. male with medical history significant of EtOH abuse and new diagnosis of A.Fib recently, saw Dr. Allyson Sabal 1/5, started initially on cardizem and xarelto.  Cardizem made him feel sick so he quit taking this.  Plotnikov started him on metoprolol which he quit taking because he felt it gave him stomach cramps.He also stopped taking Xarelto a couple of days ago due to cost.  Presented to ED with ongoing A.Fib RVR, and peripheral edema in legs worsening over the past couple of days as well as weight gain of 15 to 20 pounds.  Upon arrival to ED, he was in A. fib with RVR with rates around 150.  Soft BPs.  CTA chest negative for PE.  Creatinine up to 1.5.  Admitted to hospitalist and cardiology consulted.  Assessment & Plan:   Principal Problem:   New onset of congestive heart failure (HCC) Active Problems:   Atrial fibrillation with RVR (HCC)   Acute HFrEF (heart failure with reduced ejection fraction) (HCC)   AKI (acute kidney injury) (HCC)   ETOH abuse  Atrial fibrillation with RVR: Patient's rates around 130 again today when I saw him this morning but he was totally asymptomatic other than cough.  I see that cardiology has increased his Coreg to 12.5 mg twice daily starting tonight.  He has been transition to Xarelto as well.  Appreciate cardiology help managing this.  He is scheduled for DCCV/cardioversion tomorrow.  Acute systolic congestive heart failure: Patient was not hypoxic but did have some shortness of breath.  Chest x-ray negative for pulmonary edema .  Transthoracic echo shows 20 to 25% ejection fraction.  Cardiology managing.  Received 1 dose of Lasix day before yesterday, and other doses ordered by cardiology today.  Management per cardiology.  Also has been started on Entresto.  AKI: Baseline creatinine around 1.1 but  presented with 1.5 and currently stable.  Monitor closely.  Chronic alcoholism: Patient himself told me that he drinks about 2 drinks of bourbon daily but also gave me a contradictory statement that he did not drink for the whole week.  Per cardiology note, his wife had informed them that patient drinks a whole bottle of bourbon every day.  Patient denied having any withdrawal symptoms currently or in the past or any hospitalizations either.  I highly doubt that history.  He looks calm with no withdrawal symptoms at this point in time.  We will monitor him closely with CIWA protocol and as needed Ativan.  DVT prophylaxis:    Code Status: Full Code  Family Communication:  None present at bedside.  Plan of care discussed with patient in length and he/she verbalized understanding and agreed with it.  Status is: Inpatient  Remains inpatient appropriate because: Needs management of A. fib with RVR       Estimated body mass index is 34.84 kg/m as calculated from the following:   Height as of 01/28/21: 5\' 9"  (1.753 m).   Weight as of this encounter: 107 kg.    Nutritional Assessment: Body mass index is 34.84 kg/m.Marland Kitchen Seen by dietician.  I agree with the assessment and plan as outlined below: Nutrition Status:        . Skin Assessment: I have examined the patient's skin and I agree with the wound assessment as performed by the wound care RN as outlined below:  Consultants:  Cardiology  Procedures:  None  Antimicrobials:  Anti-infectives (From admission, onward)    None         Subjective: Seen and examined.  He is calm and cooperative.  No complaints.  Denies any palpitation, chest pain or shortness of breath.  Objective: Vitals:   02/15/21 0019 02/15/21 0450 02/15/21 0723 02/15/21 1144  BP: 115/87 (!) 114/96 (!) 133/103 104/90  Pulse: (!) 105 (!) 117  75  Resp: Temp: 97.8 F (36.6 C) 98.3 F (36.8 C) (!) 97.5 F (36.4 C) (!) 97.3 F (36.3 C)   TempSrc: Oral Oral Oral Oral  SpO2: 100% 99% 97% 96%  Weight:  107 kg      Intake/Output Summary (Last 24 hours) at 02/15/2021 1330 Last data filed at 02/15/2021 0830 Gross per 24 hour  Intake 120 ml  Output 250 ml  Net -130 ml   Filed Weights   02/14/21 1750 02/15/21 0450  Weight: 107.4 kg 107 kg    Examination:  General exam: Appears calm and comfortable  Respiratory system: Clear to auscultation. Respiratory effort normal. Cardiovascular system: S1 & S2 heard, irregularly irregular rate and rhythm. No JVD, murmurs, rubs, gallops or clicks.  +2 pitting edema bilateral lower extremity. Gastrointestinal system: Abdomen is nondistended, soft and nontender. No organomegaly or masses felt. Normal bowel sounds heard. Central nervous system: Alert and oriented. No focal neurological deficits. Extremities: Symmetric 5 x 5 power. Skin: No rashes, lesions or ulcers.  Psychiatry: Judgement and insight appear normal. Mood & affect appropriate.   Data Reviewed: I have personally reviewed following labs and imaging studies  CBC: Recent Labs  Lab 02/13/21 1446 02/14/21 0727  WBC 4.8 4.2  HGB 16.3 14.9  HCT 50.2 46.5  MCV 94.7 94.1  PLT 274 247    Basic Metabolic Panel: Recent Labs  Lab 02/13/21 1446 02/14/21 0727 02/15/21 0400  NA 139 141 141  K 5.1 4.2 4.6  CL 108 108 109  CO2 GLUCOSE 116* 107* 105*  BUN 19 19 23*  CREATININE 1.52* 1.51* 1.58*  CALCIUM 9.3 8.8* 8.7*    GFR: Estimated Creatinine Clearance: 65.2 mL/min (A) (by C-G formula based on SCr of 1.58 mg/dL (H)). Liver Function Tests: Recent Labs  Lab 02/13/21 1533  AST 47*  ALT 68*  ALKPHOS 46  BILITOT 1.3*  PROT 6.0*  ALBUMIN 3.7    No results for input(s): LIPASE, AMYLASE in the last 168 hours. No results for input(s): AMMONIA in the last 168 hours. Coagulation Profile: Recent Labs  Lab 02/13/21 1446  INR 1.1    Cardiac Enzymes: No results for input(s): CKTOTAL, CKMB, CKMBINDEX,  TROPONINI in the last 168 hours. BNP (last 3 results) No results for input(s): PROBNP in the last 8760 hours. HbA1C: No results for input(s): HGBA1C in the last 72 hours. CBG: No results for input(s): GLUCAP in the last 168 hours. Lipid Profile: No results for input(s): CHOL, HDL, LDLCALC, TRIG, CHOLHDL, LDLDIRECT in the last 72 hours. Thyroid Function Tests: No results for input(s): TSH, T4TOTAL, FREET4, T3FREE, THYROIDAB in the last 72 hours. Anemia Panel: No results for input(s): VITAMINB12, FOLATE, FERRITIN, TIBC, IRON, RETICCTPCT in the last 72 hours. Sepsis Labs: No results for input(s): PROCALCITON, LATICACIDVEN in the last 168 hours.  Recent Results (from the past 240 hour(s))  Resp Panel by RT-PCR (Flu A&B, Covid) Nasopharyngeal Swab     Status: None   Collection Time: 02/13/21  9:02 PM  Specimen: Nasopharyngeal Swab; Nasopharyngeal(NP) swabs in vial transport medium  Result Value Ref Range Status   SARS Coronavirus 2 by RT PCR NEGATIVE NEGATIVE Final    Comment: (NOTE) SARS-CoV-2 target nucleic acids are NOT DETECTED.  The SARS-CoV-2 RNA is generally detectable in upper respiratory specimens during the acute phase of infection. The lowest concentration of SARS-CoV-2 viral copies this assay can detect is 138 copies/mL. A negative result does not preclude SARS-Cov-2 infection and should not be used as the sole basis for treatment or other patient management decisions. A negative result may occur with  improper specimen collection/handling, submission of specimen other than nasopharyngeal swab, presence of viral mutation(s) within the areas targeted by this assay, and inadequate number of viral copies(<138 copies/mL). A negative result must be combined with clinical observations, patient history, and epidemiological information. The expected result is Negative.  Fact Sheet for Patients:  BloggerCourse.com  Fact Sheet for Healthcare Providers:   SeriousBroker.it  This test is no t yet approved or cleared by the [place] FDA and  has been authorized for detection and/or diagnosis of SARS-CoV-2 by FDA under an Emergency Use Authorization (EUA). This EUA will remain  in effect (meaning this test can be used) for the duration of the COVID-19 declaration under Section 564(b)(1) of the Act, 21 U.S.C.section 360bbb-3(b)(1), unless the authorization is terminated  or revoked sooner.       Influenza A by PCR NEGATIVE NEGATIVE Final   Influenza B by PCR NEGATIVE NEGATIVE Final    Comment: (NOTE) The Xpert Xpress SARS-CoV-2/FLU/RSV plus assay is intended as an aid in the diagnosis of influenza from Nasopharyngeal swab specimens and should not be used as a sole basis for treatment. Nasal washings and aspirates are unacceptable for Xpert Xpress SARS-CoV-2/FLU/RSV testing.  Fact Sheet for Patients: BloggerCourse.com  Fact Sheet for Healthcare Providers: SeriousBroker.it  This test is not yet approved or cleared by the [place] FDA and has been authorized for detection and/or diagnosis of SARS-CoV-2 by FDA under an Emergency Use Authorization (EUA). This EUA will remain in effect (meaning this test can be used) for the duration of the COVID-19 declaration under Section 564(b)(1) of the Act, 21 U.S.C. section 360bbb-3(b)(1), unless the authorization is terminated or revoked.  Performed at [facility], [address], [place], [place] [address]       Radiology Studies: DG Chest 2 View  Result Date: 02/13/2021 CLINICAL DATA:  Atrial fibrillation EXAM: CHEST - 2 VIEW COMPARISON:  None. FINDINGS: Cardiomegaly noted without CHF. Increased basilar streaky opacities favored to be atelectasis. No definite focal pneumonia, collapse or consolidation. No large effusion or pneumothorax. Trachea midline. Minor endplate degenerative changes of the  spine. Monitor leads overlie the chest. IMPRESSION: Cardiomegaly and bibasilar atelectasis. Electronically Signed   By: Judie Petit.  Shick M.D.   On: 02/13/2021 16:07   CT Angio Chest PE W and/or Wo Contrast  Result Date: 02/13/2021 CLINICAL DATA:  Pulmonary embolism (PE) suspected, positive D-dimer EXAM: CT ANGIOGRAPHY CHEST WITH CONTRAST TECHNIQUE: Multidetector CT imaging of the chest was performed using the standard protocol during bolus administration of intravenous contrast. Multiplanar CT image reconstructions and MIPs were obtained to evaluate the vascular anatomy. RADIATION DOSE REDUCTION: This exam was performed according to the departmental dose-optimization program which includes automated exposure control, adjustment of the mA and/or kV according to patient size and/or use of iterative reconstruction technique. CONTRAST:  45mL OMNIPAQUE IOHEXOL 350 MG/ML SOLN COMPARISON:  None. FINDINGS: Cardiovascular: Cardiomegaly. No filling defects in  the pulmonary arteries to suggest pulmonary emboli. No aortic aneurysm. Mediastinum/Nodes: No mediastinal, hilar, or axillary adenopathy. Trachea and esophagus are unremarkable. Thyroid unremarkable. Lungs/Pleura: Moderate right pleural effusion. No confluent airspace opacities. Upper Abdomen: Imaging into the upper abdomen demonstrates no acute findings. Musculoskeletal: Chest wall soft tissues are unremarkable. No acute bony abnormality. Review of the MIP images confirms the above findings. IMPRESSION: Moderate right pleural effusion. Cardiomegaly. No evidence of pulmonary embolus. Electronically Signed   By: Charlett Nose M.D.   On: 02/13/2021 17:34   ECHOCARDIOGRAM COMPLETE  Result Date: 02/14/2021    ECHOCARDIOGRAM REPORT   Patient Name:   Eddie Dixon Date of Exam: 02/14/2021 Medical Rec #:  [id number]      Height:       69.0 in Accession #:    [id number]     Weight:       236.0 lb Date of Birth:  11/30/1965     BSA:          2.217 m Patient Age:    55 years        BP:           120/90 mmHg Patient Gender: M              HR:           115 bpm. Exam Location:  Inpatient Procedure: 2D Echo, Cardiac Doppler, Color Doppler and Intracardiac            Opacification Agent Indications:    Atrial fibrillation with RVR  History:        Patient has no prior history of Echocardiogram examinations.                 ETOH abuse.  Sonographer:    Roosvelt Maser RDCS Referring Phys: [phone] JARED M GARDNER IMPRESSIONS  1. Left ventricular ejection fraction, by estimation, is 20 to 25%. The left ventricle has severely decreased function. The left ventricle demonstrates global hypokinesis. There is mild left ventricular hypertrophy. Left ventricular diastolic parameters  are indeterminate.  2. Right ventricular systolic function is moderately reduced. The right ventricular size is mildly enlarged. There is normal pulmonary artery systolic pressure. The estimated right ventricular systolic pressure is 24.3 mmHg.  3. Left atrial size was moderately dilated.  4. Right atrial size was severely dilated.  5. The mitral valve is normal in structure. Mild to moderate mitral valve regurgitation. No evidence of mitral stenosis.  6. Tricuspid valve regurgitation is moderate.  7. The aortic valve was not well visualized. Aortic valve regurgitation is not visualized. No aortic stenosis is present.  8. The inferior vena cava is dilated in size with >50% respiratory variability, suggesting right atrial pressure of 8 mmHg. FINDINGS  Left Ventricle: Left ventricular ejection fraction, by estimation, is 20 to 25%. The left ventricle has severely decreased function. The left ventricle demonstrates global hypokinesis. Definity contrast agent was given IV to delineate the left ventricular endocardial borders. The left ventricular internal cavity size was normal in size. There is mild left ventricular hypertrophy. Left ventricular diastolic parameters are indeterminate. Right Ventricle: The right ventricular size is mildly  enlarged. Right vetricular wall thickness was not well visualized. Right ventricular systolic function is moderately reduced. There is normal pulmonary artery systolic pressure. The tricuspid regurgitant velocity is 2.02 m/s, and with an assumed right atrial pressure of 8 mmHg, the estimated right ventricular systolic pressure is 24.3 mmHg. Left Atrium: Left atrial size was moderately dilated. Right Atrium: Right atrial size  was severely dilated. Pericardium: There is no evidence of pericardial effusion. Mitral Valve: The mitral valve is normal in structure. Mild to moderate mitral valve regurgitation. No evidence of mitral valve stenosis. Tricuspid Valve: The tricuspid valve is normal in structure. Tricuspid valve regurgitation is moderate. Aortic Valve: The aortic valve was not well visualized. Aortic valve regurgitation is not visualized. No aortic stenosis is present. Aortic valve mean gradient measures 2.0 mmHg. Aortic valve peak gradient measures 3.0 mmHg. Aortic valve area, by VTI measures 1.93 cm. Pulmonic Valve: The pulmonic valve was not well visualized. Pulmonic valve regurgitation is trivial. Aorta: The aortic root and ascending aorta are structurally normal, with no evidence of dilitation. Venous: The inferior vena cava is dilated in size with greater than 50% respiratory variability, suggesting right atrial pressure of 8 mmHg. IAS/Shunts: The interatrial septum was not well visualized.  LEFT VENTRICLE PLAX 2D LVIDd:         5.40 cm LVIDs:         4.70 cm LV PW:         1.20 cm LV IVS:        1.20 cm LVOT diam:     1.90 cm LV SV:         25 LV SV Index:   11 LVOT Area:     2.84 cm  RIGHT VENTRICLE            IVC RV Basal diam:  4.60 cm    IVC diam: 2.00 cm RV Mid diam:    3.80 cm RV S prime:     9.25 cm/s TAPSE (M-mode): 1.1 cm LEFT ATRIUM              Index        RIGHT ATRIUM           Index LA diam:        4.10 cm  1.85 cm/m   RA Area:     31.60 cm LA Vol (A2C):   111.0 ml 50.08 ml/m  RA Volume:    115.00 ml 51.88 ml/m LA Vol (A4C):   82.0 ml  36.99 ml/m LA Biplane Vol: 99.4 ml  44.84 ml/m  AORTIC VALVE AV Area (Vmax):    1.86 cm AV Area (Vmean):   1.65 cm AV Area (VTI):     1.93 cm AV Vmax:           86.20 cm/s AV Vmean:          61.100 cm/s AV VTI:            0.128 m AV Peak Grad:      3.0 mmHg AV Mean Grad:      2.0 mmHg LVOT Vmax:         56.60 cm/s LVOT Vmean:        35.600 cm/s LVOT VTI:          0.087 m LVOT/AV VTI ratio: 0.68  AORTA Ao Root diam: 2.90 cm Ao Asc diam:  3.20 cm TRICUSPID VALVE TR Peak grad:   16.3 mmHg TR Vmax:        202.00 cm/s  SHUNTS Systemic VTI:  0.09 m Systemic Diam: 1.90 cm Epifanio Lesches MD Electronically signed by Epifanio Lesches MD Signature Date/Time: 02/14/2021/5:27:00 PM    Final     Scheduled Meds:  carvedilol  12.5 mg Oral BID WC   folic acid  1 mg Oral Daily   multivitamin with minerals  1 tablet Oral Daily  rivaroxaban  20 mg Oral Q supper   sacubitril-valsartan  1 tablet Oral BID   sodium chloride flush  3 mL Intravenous Q12H   thiamine  100 mg Oral Daily   Continuous Infusions:  sodium chloride     sodium chloride 20 mL/hr at 02/15/21 0521   sodium chloride       LOS: 2 days   Time spent: 30 minutes  Hughie Closs, MD [facility]  02/15/2021, 1:30 PM  Please page via Amion and do not message via secure chat for anything urgent. Secure chat can be used for anything non urgent.  How to contact the [facility] Attending or Consulting provider 7A - 7P or covering provider during after hours 7P -7A, for this patient?  Check the care team in [facility] and look for a) attending/consulting [facility] provider listed and b) the [facility] team listed. Page or secure chat 7A-7P. Log into [email or web address] and use [place]'s universal password to access. If you do not have the password, please contact the hospital operator. Locate the [facility] provider you are looking for under [facility] and page to a number that you can be directly reached. If you  still have difficulty reaching the provider, please page the [facility] (Director on Call) for the Hospitalists listed on amion for assistance.

## 2021-02-15 NOTE — Progress Notes (Signed)
Heart Failure Nurse Navigator Progress Note  PCP: Plotnikov, Georgina Quint, MD PCP-Cardiologist: Erlene Quan., MD Admission Diagnosis: CHF Admitted from: home with significant other  Presentation:   Eddie Dixon presented 1/21 with increased BLE edema. Pt resting in bed on room air. Patient interactive with interview process. Pt ambulated independently to bathroom, asked to void in urinal to monitor output. Pt states he had 5-6 voids in the toilet today--notified bedside RN. Pt now understands need to void in urinal. HR maxed at 155, resting HR 130s. Pt lives at home, rent/bills are caught up. Pt had medication non-adherence d/t cost of xarelto, abd cramping from metoprolol. Pt states he is unaware of cardizem medication prior to admission. Explained importance of medication compliance and need to let medical provider know of side effects/issues.  Pt eats high salt foods--bacon, lunch meat, fast foods often. Pt states he had been "drinking a lot of water while taking metoprolol to help with belly pain". Educated on fluid modifications. Pt states he drinks a fifth of bourbon in 7 days (17 shots/week). ? Previous note states 1 bottle/day?? Chews tobacco, ready to quit. Encouraged alcohol and tobacco cessation. Pt works third shift as Merchandiser, retail, so has some issues with medication times.   ECHO/ LVEF: 20-25%, RV moderately reduced, RA severely dilated, LA moderately dilated, moderate Tr, mild MR  Clinical Course:  Past Medical History:  Diagnosis Date   A-fib (HCC)    Anxiety    ED (erectile dysfunction)    Elevated blood pressure      Social History   Socioeconomic History   Marital status: Divorced    Spouse name: Not on file   Number of children: 2   Years of education: Not on file   Highest education level: Some college, no degree  Occupational History   Occupation: Merchandiser, retail    Comment: [place] [employer]  Tobacco Use   Smoking status: Never   Smokeless tobacco: Current    Types: Chew   Vaping Use   Vaping Use: Never used  Substance and Sexual Activity   Alcohol use: Yes    Alcohol/week: 17.0 standard drinks    Types: 17 Shots of liquor per week    Comment: drinks fifth of liquor per week x 1 year.   Drug use: No   Sexual activity: Yes  Other Topics Concern   Not on file  Social History Narrative   Regular Exercise -  YES         Social Determinants of Health   Financial Resource Strain: High Risk   Difficulty of Paying Living Expenses: Hard  Food Insecurity: No Food Insecurity   Worried About Running Out of Food in the Last Year: Never true   Ran Out of Food in the Last Year: Never true  Transportation Needs: No Transportation Needs   Lack of Transportation (Medical): No   Lack of Transportation (Non-Medical): No  Physical Activity: Not on file  Stress: Not on file  Social Connections: Not on file    High Risk Criteria for Readmission and/or Poor Patient Outcomes: Heart failure hospital admissions (last 6 months): 2  No Show rate: 5% Difficult social situation: no Demonstrates medication adherence: NO Primary Language: English Literacy level: Able to read/write and comprehend. Wears glasses.   Education Assessment and Provision:  Detailed education and instructions provided on heart failure disease management including the following:  Signs and symptoms of Heart Failure When to call the physician Importance of daily weights Low sodium diet Fluid restriction Medication management  Anticipated future follow-up appointments  Patient education given on each of the above topics.  Patient acknowledges understanding via teach back method and acceptance of all instructions.  Education Materials:  "Living Better With Heart Failure" Booklet, HF zone tool, & Daily Weight Tracker Tool.  Patient has scale at home: yes Patient has pill box at home: no, given from AHF clinic.    Barriers of Care:   -alcohol dependence -new dx -medication  compliance -financial strain -dietary indiscretions  Considerations/Referrals:   Referral made to Heart Failure Pharmacist Stewardship: yes, appreciated Referral made to Heart Failure CSW/NCM TOC: yes, appreciated Referral made to Heart & Vascular TOC clinic: yes, Tuesday 1/31 @ noon  Items for Follow-up on DC/TOC: -optimize -alcohol cessation -tobacco cessation -medication cost -financial strain -continue HF education -dietary modifications   Ozella Rocks, MSN, RN Heart Failure Nurse Navigator [phone]

## 2021-02-15 NOTE — Progress Notes (Signed)
Primary Cardiologist:  Allyson Sabal  Subjective:  Doing well, he's asymptomatic  Objective:  Vitals:   02/14/21 1957 02/15/21 0019 02/15/21 0450 02/15/21 0723  BP: (!) 109/92 115/87 (!) 114/96 (!) 133/103  Pulse: (!) 120 (!) 105 (!) 117   Resp: 20 20 20 18   Temp: 98.1 F (36.7 C) 97.8 F (36.6 C) 98.3 F (36.8 C) (!) 97.5 F (36.4 C)  TempSrc: Oral Oral Oral Oral  SpO2: 100% 100% 99% 97%  Weight:   107 kg     Intake/Output from previous day:  Intake/Output Summary (Last 24 hours) at 02/15/2021 1120 Last data filed at 02/15/2021 0830 Gross per 24 hour  Intake 120 ml  Output 250 ml  Net -130 ml    Physical Exam: Gen: well appearing, lying flat HEENT: moist mm No JVD Lungs clear with no wheezing and good diaphragmatic motion Heart: irregular rhythm, no murmurs Abdomen:non distended Extremities;Trace LE edema Neuro non-focal Skin warm and well perfused MSK: No deformities Psych- normal affect   Lab Results: Basic Metabolic Panel: Recent Labs    02/14/21 0727 02/15/21 0400  NA 141 141  K 4.2 4.6  CL 108 109  CO2 23 26  GLUCOSE 107* 105*  BUN 19 23*  CREATININE 1.51* 1.58*  CALCIUM 8.8* 8.7*   Liver Function Tests: Recent Labs    02/13/21 1533  AST 47*  ALT 68*  ALKPHOS 46  BILITOT 1.3*  PROT 6.0*  ALBUMIN 3.7   No results for input(s): LIPASE, AMYLASE in the last 72 hours. CBC: Recent Labs    02/13/21 1446 02/14/21 0727  WBC 4.8 4.2  HGB 16.3 14.9  HCT 50.2 46.5  MCV 94.7 94.1  PLT 274 247    D-Dimer: Recent Labs    02/13/21 1533  DDIMER 0.97*     Cardiac Studies:  ECG: afib rate 153 low voltage    Telemetry: afib/flutter rate 120   Echo: newly reduced EF 20-25%  The 10-year ASCVD risk score (Arnett DK, et al., 2019) is: 3.7%   Values used to calculate the score:     Age: 56 years     Sex: Male     Is Non-Hispanic African American: No     Diabetic: No     Tobacco smoker: No     Systolic Blood Pressure: 133 mmHg     Is BP  treated: Yes     HDL Cholesterol: 58.8 mg/dL     Total Cholesterol: 159 mg/dL   Medications:    carvedilol  12.5 mg Oral BID WC   folic acid  1 mg Oral Daily   multivitamin with minerals  1 tablet Oral Daily   rivaroxaban  20 mg Oral Q supper   sodium chloride flush  3 mL Intravenous Q12H   thiamine  100 mg Oral Daily      sodium chloride     sodium chloride 20 mL/hr at 02/15/21 0521   sodium chloride      Assessment/Plan:  Afib:  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 Increase coreg to 12.5 bid He had taken xarelto intermittently but not since Wednesday. Plan for TEE/DCCV with systolic CHF. Options for AAD include dofetilide (Qtc Sunday), too young for long term amiodarone (TSH 2.8; LFTs mildly elevated, no lung dx), not a flecainide candidate. Sotalol is a potential. Can FU with afib clinic - rates up to 120s, plan for TEE/DCCV 02/16/2021 - cont rate control with coreg - no cardizem -  on xarelto 20 mg  ° ° °Systolic HF NYHA Class II: ?tachymediated/setting of heavy etoh. LVIDd 5.4 cm not significantly dilated. Asymptomatic. No recommendation for inpatient ischemic evaluation; ASCVD is low, LDL 83. BNP 1340 °- looks closer to euvolemia; satting high 90s likely can DC nasal cannula soon °- s/p lasix 40 IV 1/21, can re-dose today °- cont coreg per above (Bps tolerating, not in shock) °- started entresto °- has AKI, will add low dose spironolactone if showing resolution  °- recommend A1c for further risk stratification ° ° ° °Eddie Dixon E °02/15/2021, 11:20 AM ° ° ° °

## 2021-02-15 NOTE — Plan of Care (Signed)
  Problem: Education: Goal: Knowledge of General Education information will improve Description Including pain rating scale, medication(s)/side effects and non-pharmacologic comfort measures Outcome: Progressing   Problem: Clinical Measurements: Goal: Ability to maintain clinical measurements within normal limits will improve Outcome: Progressing   Problem: Clinical Measurements: Goal: Cardiovascular complication will be avoided Outcome: Progressing   Problem: Cardiac: Goal: Ability to achieve and maintain adequate cardiopulmonary perfusion will improve Outcome: Progressing   

## 2021-02-15 NOTE — H&P (View-Only) (Signed)
Primary Cardiologist:  Allyson Sabal  Subjective:  Doing well, he's asymptomatic  Objective:  Vitals:   02/14/21 1957 02/15/21 0019 02/15/21 0450 02/15/21 0723  BP: (!) 109/92 115/87 (!) 114/96 (!) 133/103  Pulse: (!) 120 (!) 105 (!) 117   Resp: 20 20 20 18   Temp: 98.1 F (36.7 C) 97.8 F (36.6 C) 98.3 F (36.8 C) (!) 97.5 F (36.4 C)  TempSrc: Oral Oral Oral Oral  SpO2: 100% 100% 99% 97%  Weight:   107 kg     Intake/Output from previous day:  Intake/Output Summary (Last 24 hours) at 02/15/2021 1120 Last data filed at 02/15/2021 0830 Gross per 24 hour  Intake 120 ml  Output 250 ml  Net -130 ml    Physical Exam: Gen: well appearing, lying flat HEENT: moist mm No JVD Lungs clear with no wheezing and good diaphragmatic motion Heart: irregular rhythm, no murmurs Abdomen:non distended Extremities;Trace LE edema Neuro non-focal Skin warm and well perfused MSK: No deformities Psych- normal affect   Lab Results: Basic Metabolic Panel: Recent Labs    02/14/21 0727 02/15/21 0400  NA 141 141  K 4.2 4.6  CL 108 109  CO2 23 26  GLUCOSE 107* 105*  BUN 19 23*  CREATININE 1.51* 1.58*  CALCIUM 8.8* 8.7*   Liver Function Tests: Recent Labs    02/13/21 1533  AST 47*  ALT 68*  ALKPHOS 46  BILITOT 1.3*  PROT 6.0*  ALBUMIN 3.7   No results for input(s): LIPASE, AMYLASE in the last 72 hours. CBC: Recent Labs    02/13/21 1446 02/14/21 0727  WBC 4.8 4.2  HGB 16.3 14.9  HCT 50.2 46.5  MCV 94.7 94.1  PLT 274 247    D-Dimer: Recent Labs    02/13/21 1533  DDIMER 0.97*     Cardiac Studies:  ECG: afib rate 153 low voltage    Telemetry: afib/flutter rate 120   Echo: newly reduced EF 20-25%  The 10-year ASCVD risk score (Arnett DK, et al., 2019) is: 3.7%   Values used to calculate the score:     Age: 56 years     Sex: Male     Is Non-Hispanic African American: No     Diabetic: No     Tobacco smoker: No     Systolic Blood Pressure: 133 mmHg     Is BP  treated: Yes     HDL Cholesterol: 58.8 mg/dL     Total Cholesterol: 159 mg/dL   Medications:    carvedilol  12.5 mg Oral BID WC   folic acid  1 mg Oral Daily   multivitamin with minerals  1 tablet Oral Daily   rivaroxaban  20 mg Oral Q supper   sodium chloride flush  3 mL Intravenous Q12H   thiamine  100 mg Oral Daily      sodium chloride     sodium chloride 20 mL/hr at 02/15/21 0521   sodium chloride      Assessment/Plan:  Afib:  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 Increase coreg to 12.5 bid He had taken xarelto intermittently but not since Wednesday. Plan for TEE/DCCV with systolic CHF. Options for AAD include dofetilide (Qtc Sunday), too young for long term amiodarone (TSH 2.8; LFTs mildly elevated, no lung dx), not a flecainide candidate. Sotalol is a potential. Can FU with afib clinic - rates up to 120s, plan for TEE/DCCV 02/16/2021 - cont rate control with coreg - no cardizem -  on xarelto 20 mg    Systolic HF NYHA Class II: ?tachymediated/setting of heavy etoh. LVIDd 5.4 cm not significantly dilated. Asymptomatic. No recommendation for inpatient ischemic evaluation; ASCVD is low, LDL 83. BNP 1340 - looks closer to euvolemia; satting high 90s likely can DC nasal cannula soon - s/p lasix 40 IV 1/21, can re-dose today - cont coreg per above (Bps tolerating, not in shock) - started entresto - has AKI, will add low dose spironolactone if showing resolution  - recommend A1c for further risk stratification    Eddie Dixon 02/15/2021, 11:20 AM

## 2021-02-16 ENCOUNTER — Encounter (HOSPITAL_COMMUNITY)
Admission: EM | Disposition: A | Discharge status: Home / Self Care | Payer: Self-pay | Source: Ambulatory Visit | Attending: Internal Medicine

## 2021-02-16 ENCOUNTER — Inpatient Hospital Stay (HOSPITAL_COMMUNITY): Payer: Managed Care, Other (non HMO) | Admitting: Certified Registered Nurse Anesthetist

## 2021-02-16 ENCOUNTER — Encounter (HOSPITAL_COMMUNITY): Payer: Self-pay | Admitting: Internal Medicine

## 2021-02-16 ENCOUNTER — Other Ambulatory Visit (HOSPITAL_COMMUNITY): Payer: Self-pay

## 2021-02-16 ENCOUNTER — Inpatient Hospital Stay (HOSPITAL_COMMUNITY): Payer: Managed Care, Other (non HMO)

## 2021-02-16 DIAGNOSIS — I361 Nonrheumatic tricuspid (valve) insufficiency: Secondary | ICD-10-CM

## 2021-02-16 DIAGNOSIS — I340 Nonrheumatic mitral (valve) insufficiency: Secondary | ICD-10-CM | POA: Diagnosis not present

## 2021-02-16 HISTORY — PX: TEE WITHOUT CARDIOVERSION: SHX5443

## 2021-02-16 HISTORY — PX: BUBBLE STUDY: SHX6837

## 2021-02-16 LAB — BASIC METABOLIC PANEL
Anion gap: 9 (ref 5–15)
BUN: 21 mg/dL — ABNORMAL HIGH (ref 6–20)
CO2: 27 mmol/L (ref 22–32)
Calcium: 8.6 mg/dL — ABNORMAL LOW (ref 8.9–10.3)
Chloride: 107 mmol/L (ref 98–111)
Creatinine, Ser: 1.8 mg/dL — ABNORMAL HIGH (ref 0.61–1.24)
GFR, Estimated: 44 mL/min — ABNORMAL LOW (ref >60)
Glucose, Bld: 100 mg/dL — ABNORMAL HIGH (ref 70–99)
Potassium: 5 mmol/L (ref 3.5–5.1)
Sodium: 143 mmol/L (ref 135–145)

## 2021-02-16 LAB — HEMOGLOBIN A1C
Hgb A1c MFr Bld: 6.1 % — ABNORMAL HIGH (ref 4.8–5.6)
Mean Plasma Glucose: 128 mg/dL

## 2021-02-16 SURGERY — ECHOCARDIOGRAM, TRANSESOPHAGEAL
Anesthesia: Monitor Anesthesia Care

## 2021-02-16 MED ORDER — FUROSEMIDE 10 MG/ML IJ SOLN
80.0000 mg | Freq: Once | INTRAMUSCULAR | Status: AC
  Administered 2021-02-16: 17:00:00 80 mg via INTRAVENOUS
  Filled 2021-02-16: qty 8

## 2021-02-16 MED ORDER — SODIUM CHLORIDE 0.9 % IV SOLN: INTRAVENOUS | Status: DC | PRN

## 2021-02-16 MED ORDER — PROPOFOL 500 MG/50ML IV EMUL
INTRAVENOUS | Status: DC | PRN
Start: 2021-02-16 — End: 2021-02-16
  Administered 2021-02-16: 125 ug/kg/min via INTRAVENOUS

## 2021-02-16 MED ORDER — PROPOFOL 10 MG/ML IV BOLUS
INTRAVENOUS | Status: DC | PRN
Start: 2021-02-16 — End: 2021-02-16
  Administered 2021-02-16: 20 mg via INTRAVENOUS

## 2021-02-16 MED ORDER — PHENYLEPHRINE HCL (PRESSORS) 10 MG/ML IV SOLN
INTRAVENOUS | Status: DC | PRN
  Administered 2021-02-16 (×2): 80 ug via INTRAVENOUS
  Administered 2021-02-16: 40 ug via INTRAVENOUS
  Administered 2021-02-16: 120 ug via INTRAVENOUS
  Administered 2021-02-16 (×2): 80 ug via INTRAVENOUS

## 2021-02-16 MED ORDER — LIDOCAINE VISCOUS HCL 2 % MT SOLN
OROMUCOSAL | Status: DC | PRN
  Administered 2021-02-16: 10 mL

## 2021-02-16 MED ORDER — CARVEDILOL 25 MG PO TABS
25.0000 mg | ORAL_TABLET | Freq: Two times a day (BID) | ORAL | Status: DC
  Filled 2021-02-16 (×2): qty 1

## 2021-02-16 NOTE — Progress Notes (Signed)
Heart Failure Stewardship Pharmacist Progress Note   PCP: Plotnikov, Georgina Quint, MD PCP-Cardiologist: None    HPI:  56 yo M with PMH of ongoing alcohol use and new onset afib. He stopped taking his Xarelto due to cost and stopped metoprolol because of stomach cramps. He presented to the ED on 1/21 with afib RVR, shortness of breath, and LE edema. CXR with cardiomegaly and bibasilar atelectasis. ECHO on 1/22 with LVEF of 20-25%. DCCV/TEE today but cardioversion was not completed due to LAA thrombus. LVEF on TEE was 50-55%.  Current HF Medications: Beta Blocker: carvedilol 12.5 mg BID ACE/ARB/ARNI: Entresto 24/26 mg BID  Prior to admission HF Medications: Beta blocker: metoprolol XL 25 mg daily  Pertinent Lab Values: Serum creatinine 1.80, BUN 21, Potassium 5.0, Sodium 143, BNP 1340.6   Vital Signs: Weight: 229 lbs (admission weight: 236 lbs) Blood pressure: 110/80s  Heart rate: 110s  I/O: -1.3L yesterday; net +61mL  Medication Assistance / Insurance Benefits Check: Does the patient have prescription insurance?  Yes Type of insurance plan: Chart[employer] loss adjuster   Outpatient Pharmacy:  Prior to admission outpatient pharmacy: [facility] Is the patient willing to use [facility] TOC pharmacy at discharge? Yes Is the patient willing to transition their outpatient pharmacy to utilize a [facility] outpatient pharmacy?   Pending    Assessment: 1. Acute systolic CHF (EF 56-43>>32-95%), due to presumed NICM. NYHA class II symptoms. - Continue carvedilol 12.6 mg BID - Continue Entresto 24/26 mg BID - Consider adding SGLT2i prior to discharge   Plan: 1) Medication changes recommended at this time: - Add Farxiga 10 mg daily tomorrow if SCr improves   2) Patient assistance: - Entresto copay >$600 because he still has a deductible remaining on insurance - however, the [facility] copay card can still be used to help pt through deductible  - Farxiga copay $15 per month  3)  Education  - To be  completed prior to discharge  Sharen Hones, PharmD, BCPS Heart Failure Engineer, building services Phone [phone]

## 2021-02-16 NOTE — CV Procedure (Addendum)
° ° °  TRANSESOPHAGEAL ECHOCARDIOGRAM   NAME:  Jamarques Erich    MRN: [id number] DOB:  1965/12/15    ADMIT DATE: 02/13/2021  INDICATIONS: Atrial fibrillation  PROCEDURE:   Informed consent was obtained prior to the procedure. The risks, benefits and alternatives for the procedure were discussed and the patient comprehended these risks.  Risks include, but are not limited to, cough, sore throat, vomiting, nausea, somnolence, esophageal and stomach trauma or perforation, bleeding, low blood pressure, aspiration, pneumonia, infection, trauma to the teeth and death.    Procedural time out performed. The oropharynx was anesthetized with viscous lidocaine.  Anesthesia was administered by the anaesthesilogy team.  The patient was administered a total of Propofol 280 mg, to achieve and maintain moderate to deep conscious sedation.  The patient's heart rate, blood pressure, and oxygen saturation were monitored continuously during the procedure.  The transesophageal probe was inserted in the esophagus and stomach without difficulty and multiple views were obtained.   The patient tolerated the procedure well.  COMPLICATIONS:    There were no immediate complications.  KEY FINDINGS:  Ejection fraction 30 to 35%, mild mitral regurgitation, right ventricle ejection fraction slightly depressed, left atrial appendage clot was appreciated.  Full report to follow. Further management per primary team.   Due to left atrial appendage clot did not proceed with DC cardioversion.  The patient was notified about the findings.  Primary team also notified about the findings.  Shaquan Missey, DO [place]   [facility]  9:15 AM

## 2021-02-16 NOTE — Progress Notes (Signed)
Spoke with Eddie Loser, Pa ok to give lasix hold coreg and reassess at 6pm

## 2021-02-16 NOTE — Progress Notes (Addendum)
Pt. BP soft. After am medications. Pt. asymptomatic requested pt not get up alone. Held lasix. Attempted to call md  no response.

## 2021-02-16 NOTE — Progress Notes (Signed)
°  Echocardiogram Echocardiogram Transesophageal has been performed.  Delcie Roch 02/16/2021, 9:20 AM

## 2021-02-16 NOTE — Interval H&P Note (Signed)
History and Physical Interval Note:  02/16/2021 8:17 AM  Eddie Dixon  has presented today for surgery, with the diagnosis of afib.  The various methods of treatment have been discussed with the patient and family. After consideration of risks, benefits and other options for treatment, the patient has consented to  Procedure(s): TRANSESOPHAGEAL ECHOCARDIOGRAM (TEE) (N/A) CARDIOVERSION (N/A) as a surgical intervention.  The patient's history has been reviewed, patient examined, no change in status, stable for surgery.  I have reviewed the patient's chart and labs.  Questions were answered to the patient's satisfaction.     Ericia Moxley

## 2021-02-16 NOTE — Progress Notes (Signed)
PROGRESS NOTE    Eddie Dixon  GBT:[id number] DOB: 1965-11-08 DOA: 02/13/2021 PCP: Tresa Garter, MD   Brief Narrative:  Eddie Dixon is a 56 y.o. male with medical history significant of EtOH abuse and new diagnosis of A.Fib recently, saw Dr. Allyson Sabal 1/5, started initially on cardizem and xarelto.  Cardizem made him feel sick so he quit taking this.  Plotnikov started him on metoprolol which he quit taking because he felt it gave him stomach cramps.He also stopped taking Xarelto a couple of days ago due to cost.  Presented to ED with ongoing A.Fib RVR, and peripheral edema in legs worsening over the past couple of days as well as weight gain of 15 to 20 pounds.  Upon arrival to ED, he was in A. fib with RVR with rates around 150.  Soft BPs.  CTA chest negative for PE.  Creatinine up to 1.5.  Admitted to hospitalist and cardiology consulted.  Assessment & Plan:   Principal Problem:   New onset of congestive heart failure (HCC) Active Problems:   Atrial fibrillation with RVR (HCC)   Acute HFrEF (heart failure with reduced ejection fraction) (HCC)   AKI (acute kidney injury) (HCC)   ETOH abuse  Atrial fibrillation with RVR: Patient's rates around 115 this morning.  Coreg increased to 25 mg twice daily by cardiology, continues to be on Xarelto.  TEE shows left atrial appendage clot, cardioversion not attempted for that reason.  Importance of medication compliance including AC reiterated.  He verbalized understanding.  Acute systolic congestive heart failure: Patient was not hypoxic with no shortness of breath.  Chest x-ray negative for pulmonary edema .  Transthoracic echo shows 20 to 25% ejection fraction.  Receiving intermittent doses of IV Lasix.  1 dose of IV 80 mg Lasix today, managed per cardiology.  TEE shows 30 to 35% ejection fraction.  Started on Dike.  AKI: Baseline creatinine around 1.1 but presented with 1.5 which has rising to 1.8 today since he was started on  Entresto yesterday.  Monitor closely.  Chronic alcoholism: Patient himself told me that he drinks about 2 drinks of bourbon daily but also gave me a contradictory statement that he did not drink for the whole week.  Per cardiology note, his wife had informed them that patient drinks a whole bottle of bourbon every day.  Patient denied having any withdrawal symptoms currently or in the past or any hospitalizations either.  I highly doubt that history.  He looks calm with no withdrawal symptoms at this point in time.  We will monitor him closely with CIWA protocol and as needed Ativan.  DVT prophylaxis:    Code Status: Full Code  Family Communication: Wife present at bedside.  Plan of care discussed with patient in length and he/she verbalized understanding and agreed with it.  Status is: Inpatient  Remains inpatient appropriate because: Needs better control of A. fib with RVR       Estimated body mass index is 33.96 kg/m as calculated from the following:   Height as of this encounter: 5\' 9"  (1.753 m).   Weight as of this encounter: 104.3 kg.    Nutritional Assessment: Body mass index is 33.96 kg/m.Marland Kitchen Seen by dietician.  I agree with the assessment and plan as outlined below: Nutrition Status:        . Skin Assessment: I have examined the patient's skin and I agree with the wound assessment as performed by the wound care RN as outlined below:  Consultants:  Cardiology  Procedures:  None  Antimicrobials:  Anti-infectives (From admission, onward)    None         Subjective: Seen and examined after the TEE.  Wife and primary nurse at the bedside.  Patient has no complaints.  Denies any palpitation, chest pain or shortness of breath.  Objective: Vitals:   02/16/21 0925 02/16/21 0932 02/16/21 0942 02/16/21 0945  BP: 107/83 111/90 (!) 110/92 111/62  Pulse: (!) 110 (!) 105 (!) 121 (!) 112  Resp: 16 16 18 16   Temp:      TempSrc:      SpO2: 98% 97% 99% 100%   Weight:      Height:        Intake/Output Summary (Last 24 hours) at 02/16/2021 1145 Last data filed at 02/16/2021 0903 Gross per 24 hour  Intake 1184.5 ml  Output 1275 ml  Net -90.5 ml    Filed Weights   02/15/21 0450 02/16/21 0406 02/16/21 0820  Weight: 107 kg 104.3 kg 104.3 kg    Examination:  General exam: Appears calm and comfortable  Respiratory system: Clear to auscultation. Respiratory effort normal. Cardiovascular system: S1 & S2 heard, irregularly irregular rate and rhythm. No JVD, murmurs, rubs, gallops or clicks.  +2 pitting edema bilateral lower extremity. Gastrointestinal system: Abdomen is nondistended, soft and nontender. No organomegaly or masses felt. Normal bowel sounds heard. Central nervous system: Alert and oriented. No focal neurological deficits. Extremities: Symmetric 5 x 5 power. Skin: No rashes, lesions or ulcers.  Psychiatry: Judgement and insight appear normal. Mood & affect appropriate.    Data Reviewed: I have personally reviewed following labs and imaging studies  CBC: Recent Labs  Lab 02/13/21 1446 02/14/21 0727  WBC 4.8 4.2  HGB 16.3 14.9  HCT 50.2 46.5  MCV 94.7 94.1  PLT 274 [id number]    Basic Metabolic Panel: Recent Labs  Lab 02/13/21 1446 02/14/21 0727 02/15/21 0400 02/16/21 0330  NA 139 141 141 143  K 5.1 4.2 4.6 5.0  CL 108 108 109 107  CO2 22 23 26 27   GLUCOSE 116* 107* 105* 100*  BUN 19 19 23* 21*  CREATININE 1.52* 1.51* 1.58* 1.80*  CALCIUM 9.3 8.8* 8.7* 8.6*    GFR: Estimated Creatinine Clearance: 56.5 mL/min (A) (by C-G formula based on SCr of 1.8 mg/dL (H)). Liver Function Tests: Recent Labs  Lab 02/13/21 1533  AST 47*  ALT 68*  ALKPHOS 46  BILITOT 1.3*  PROT 6.0*  ALBUMIN 3.7    No results for input(s): LIPASE, AMYLASE in the last 168 hours. No results for input(s): AMMONIA in the last 168 hours. Coagulation Profile: Recent Labs  Lab 02/13/21 1446  INR 1.1    Cardiac Enzymes: No results for  input(s): CKTOTAL, CKMB, CKMBINDEX, TROPONINI in the last 168 hours. BNP (last 3 results) No results for input(s): PROBNP in the last 8760 hours. HbA1C: No results for input(s): HGBA1C in the last 72 hours. CBG: No results for input(s): GLUCAP in the last 168 hours. Lipid Profile: No results for input(s): CHOL, HDL, LDLCALC, TRIG, CHOLHDL, LDLDIRECT in the last 72 hours. Thyroid Function Tests: No results for input(s): TSH, T4TOTAL, FREET4, T3FREE, THYROIDAB in the last 72 hours. Anemia Panel: No results for input(s): VITAMINB12, FOLATE, FERRITIN, TIBC, IRON, RETICCTPCT in the last 72 hours. Sepsis Labs: No results for input(s): PROCALCITON, LATICACIDVEN in the last 168 hours.  Recent Results (from the past 240 hour(s))  Resp Panel by RT-PCR (Flu A&B, Covid) Nasopharyngeal  Swab     Status: None   Collection Time: 02/13/21  9:02 PM   Specimen: Nasopharyngeal Swab; Nasopharyngeal(NP) swabs in vial transport medium  Result Value Ref Range Status   SARS Coronavirus 2 by RT PCR NEGATIVE NEGATIVE Final    Comment: (NOTE) SARS-CoV-2 target nucleic acids are NOT DETECTED.  The SARS-CoV-2 RNA is generally detectable in upper respiratory specimens during the acute phase of infection. The lowest concentration of SARS-CoV-2 viral copies this assay can detect is 138 copies/mL. A negative result does not preclude SARS-Cov-2 infection and should not be used as the sole basis for treatment or other patient management decisions. A negative result may occur with  improper specimen collection/handling, submission of specimen other than nasopharyngeal swab, presence of viral mutation(s) within the areas targeted by this assay, and inadequate number of viral copies(<138 copies/mL). A negative result must be combined with clinical observations, patient history, and epidemiological information. The expected result is Negative.  Fact Sheet for Patients:   BloggerCourse.com  Fact Sheet for Healthcare Providers:  SeriousBroker.it  This test is no t yet approved or cleared by the [place] FDA and  has been authorized for detection and/or diagnosis of SARS-CoV-2 by FDA under an Emergency Use Authorization (EUA). This EUA will remain  in effect (meaning this test can be used) for the duration of the COVID-19 declaration under Section 564(b)(1) of the Act, 21 U.S.C.section 360bbb-3(b)(1), unless the authorization is terminated  or revoked sooner.       Influenza A by PCR NEGATIVE NEGATIVE Final   Influenza B by PCR NEGATIVE NEGATIVE Final    Comment: (NOTE) The Xpert Xpress SARS-CoV-2/FLU/RSV plus assay is intended as an aid in the diagnosis of influenza from Nasopharyngeal swab specimens and should not be used as a sole basis for treatment. Nasal washings and aspirates are unacceptable for Xpert Xpress SARS-CoV-2/FLU/RSV testing.  Fact Sheet for Patients: BloggerCourse.com  Fact Sheet for Healthcare Providers: SeriousBroker.it  This test is not yet approved or cleared by the [place] FDA and has been authorized for detection and/or diagnosis of SARS-CoV-2 by FDA under an Emergency Use Authorization (EUA). This EUA will remain in effect (meaning this test can be used) for the duration of the COVID-19 declaration under Section 564(b)(1) of the Act, 21 U.S.C. section 360bbb-3(b)(1), unless the authorization is terminated or revoked.  Performed at [facility], [address] [place], [place] [address]       Radiology Studies: ECHOCARDIOGRAM COMPLETE  Result Date: 02/14/2021    ECHOCARDIOGRAM REPORT   Patient Name:   Eddie Dixon Date of Exam: 02/14/2021 Medical Rec #:  [id number]      Height:       69.0 in Accession #:    [id number]     Weight:       236.0 lb Date of Birth:  09-11-1965     BSA:          2.217 m  Patient Age:    55 years       BP:           120/90 mmHg Patient Gender: M              HR:           115 bpm. Exam Location:  Inpatient Procedure: 2D Echo, Cardiac Doppler, Color Doppler and Intracardiac            Opacification Agent Indications:    Atrial fibrillation with RVR  History:  Patient has no prior history of Echocardiogram examinations.                 ETOH abuse.  Sonographer:    Merrie Roof RDCS Referring Phys: [place]  1. Left ventricular ejection fraction, by estimation, is 20 to 25%. The left ventricle has severely decreased function. The left ventricle demonstrates global hypokinesis. There is mild left ventricular hypertrophy. Left ventricular diastolic parameters  are indeterminate.  2. Right ventricular systolic function is moderately reduced. The right ventricular size is mildly enlarged. There is normal pulmonary artery systolic pressure. The estimated right ventricular systolic pressure is Q000111Q mmHg.  3. Left atrial size was moderately dilated.  4. Right atrial size was severely dilated.  5. The mitral valve is normal in structure. Mild to moderate mitral valve regurgitation. No evidence of mitral stenosis.  6. Tricuspid valve regurgitation is moderate.  7. The aortic valve was not well visualized. Aortic valve regurgitation is not visualized. No aortic stenosis is present.  8. The inferior vena cava is dilated in size with >50% respiratory variability, suggesting right atrial pressure of 8 mmHg. FINDINGS  Left Ventricle: Left ventricular ejection fraction, by estimation, is 20 to 25%. The left ventricle has severely decreased function. The left ventricle demonstrates global hypokinesis. Definity contrast agent was given IV to delineate the left ventricular endocardial borders. The left ventricular internal cavity size was normal in size. There is mild left ventricular hypertrophy. Left ventricular diastolic parameters are indeterminate. Right Ventricle: The  right ventricular size is mildly enlarged. Right vetricular wall thickness was not well visualized. Right ventricular systolic function is moderately reduced. There is normal pulmonary artery systolic pressure. The tricuspid regurgitant velocity is 2.02 m/s, and with an assumed right atrial pressure of 8 mmHg, the estimated right ventricular systolic pressure is Q000111Q mmHg. Left Atrium: Left atrial size was moderately dilated. Right Atrium: Right atrial size was severely dilated. Pericardium: There is no evidence of pericardial effusion. Mitral Valve: The mitral valve is normal in structure. Mild to moderate mitral valve regurgitation. No evidence of mitral valve stenosis. Tricuspid Valve: The tricuspid valve is normal in structure. Tricuspid valve regurgitation is moderate. Aortic Valve: The aortic valve was not well visualized. Aortic valve regurgitation is not visualized. No aortic stenosis is present. Aortic valve mean gradient measures 2.0 mmHg. Aortic valve peak gradient measures 3.0 mmHg. Aortic valve area, by VTI measures 1.93 cm. Pulmonic Valve: The pulmonic valve was not well visualized. Pulmonic valve regurgitation is trivial. Aorta: The aortic root and ascending aorta are structurally normal, with no evidence of dilitation. Venous: The inferior vena cava is dilated in size with greater than 50% respiratory variability, suggesting right atrial pressure of 8 mmHg. IAS/Shunts: The interatrial septum was not well visualized.  LEFT VENTRICLE PLAX 2D LVIDd:         5.40 cm LVIDs:         4.70 cm LV PW:         1.20 cm LV IVS:        1.20 cm LVOT diam:     1.90 cm LV SV:         25 LV SV Index:   11 LVOT Area:     2.84 cm  RIGHT VENTRICLE            IVC RV Basal diam:  4.60 cm    IVC diam: 2.00 cm RV Mid diam:    3.80 cm RV S prime:  9.25 cm/s TAPSE (M-mode): 1.1 cm LEFT ATRIUM              Index        RIGHT ATRIUM           Index LA diam:        4.10 cm  1.85 cm/m   RA Area:     31.60 cm LA Vol (A2C):    111.0 ml 50.08 ml/m  RA Volume:   115.00 ml 51.88 ml/m LA Vol (A4C):   82.0 ml  36.99 ml/m LA Biplane Vol: 99.4 ml  44.84 ml/m  AORTIC VALVE AV Area (Vmax):    1.86 cm AV Area (Vmean):   1.65 cm AV Area (VTI):     1.93 cm AV Vmax:           86.20 cm/s AV Vmean:          61.100 cm/s AV VTI:            0.128 m AV Peak Grad:      3.0 mmHg AV Mean Grad:      2.0 mmHg LVOT Vmax:         56.60 cm/s LVOT Vmean:        35.600 cm/s LVOT VTI:          0.087 m LVOT/AV VTI ratio: 0.68  AORTA Ao Root diam: 2.90 cm Ao Asc diam:  3.20 cm TRICUSPID VALVE TR Peak grad:   16.3 mmHg TR Vmax:        202.00 cm/s  SHUNTS Systemic VTI:  0.09 m Systemic Diam: 1.90 cm Oswaldo Milian MD Electronically signed by Oswaldo Milian MD Signature Date/Time: 02/14/2021/5:27:00 PM    Final     Scheduled Meds:  carvedilol  25 mg Oral BID WC   folic acid  1 mg Oral Daily   furosemide  80 mg Intravenous Once   multivitamin with minerals  1 tablet Oral Daily   rivaroxaban  20 mg Oral Q supper   sacubitril-valsartan  1 tablet Oral BID   sodium chloride flush  3 mL Intravenous Q12H   thiamine  100 mg Oral Daily   Continuous Infusions:  sodium chloride       LOS: 3 days   Time spent: 27 minutes  Darliss Cheney, MD [facility]  02/16/2021, 11:45 AM  Please page via Shea [facility] and do not message via secure chat for anything urgent. Secure chat can be used for anything non urgent.  How to contact the [facility] Attending or Consulting provider [place] or covering provider during after hours [place], for this patient?  Check the care team in [facility] and look for a) attending/consulting [facility] provider listed and b) the [facility] team listed. Page or secure chat [phone]. Log into [email or web address] and use [place]'s universal password to access. If you do not have the password, please contact the hospital operator. Locate the [facility] provider you are looking for under [facility] and page to a number that you can be directly reached. If  you still have difficulty reaching the provider, please page the [facility] (Director on Call) for the Hospitalists listed on amion for assistance.

## 2021-02-16 NOTE — Progress Notes (Signed)
Pt returned from endo. Settled in the bed. VSS ordered breakfast. Educated on plan of care.

## 2021-02-16 NOTE — Progress Notes (Addendum)
Progress Note  Patient Name: Eddie Dixon Date of Encounter: 02/16/2021  [facility] Cardiologist: Quay Burow, MD   Subjective   Patient denies any chest pain, sob, dizziness. Reports feeling fatigued, occasional palpitations.   Inpatient Medications    Scheduled Meds:  carvedilol  12.5 mg Oral BID WC   folic acid  1 mg Oral Daily   multivitamin with minerals  1 tablet Oral Daily   rivaroxaban  20 mg Oral Q supper   sacubitril-valsartan  1 tablet Oral BID   sodium chloride flush  3 mL Intravenous Q12H   thiamine  100 mg Oral Daily   Continuous Infusions:  sodium chloride     PRN Meds: sodium chloride, acetaminophen, LORazepam **OR** LORazepam, LORazepam **OR** LORazepam, ondansetron (ZOFRAN) IV, sodium chloride flush, traZODone   Vital Signs    Vitals:   02/16/21 0925 02/16/21 0932 02/16/21 0942 02/16/21 0945  BP: 107/83 111/90 (!) 110/92 111/62  Pulse: (!) 110 (!) 105 (!) 121 (!) 112  Resp: 16 16 18 16   Temp:      TempSrc:      SpO2: 98% 97% 99% 100%  Weight:      Height:        Intake/Output Summary (Last 24 hours) at 02/16/2021 1018 Last data filed at 02/16/2021 0903 Gross per 24 hour  Intake 1184.5 ml  Output 1275 ml  Net -90.5 ml   Last 3 Weights 02/16/2021 02/16/2021 02/15/2021  Weight (lbs) 229 lb 15 oz 230 lb 235 lb 14.4 oz  Weight (kg) 104.3 kg 104.327 kg 107.004 kg      Telemetry    Atiral fibrillation, rate in 120s-130s - Personally Reviewed  ECG    A fib, rate 113, septal infarct (age undetermined) - Personally Reviewed  Physical Exam   GEN: No acute distress.   Neck: No JVD Cardiac: Irregular rhythm, no murmurs, rubs, or gallops.  Respiratory: Clear to auscultation bilaterally. GI: Soft, nontender, non-distended  MS: Trace edema  Neuro:  Nonfocal  Psych: Normal affect   Labs    High Sensitivity Troponin:   Recent Labs  Lab 02/13/21 1533  TROPONINIHS 32*     Chemistry Recent Labs  Lab 02/13/21 1533 02/14/21 0727  02/15/21 0400 02/16/21 0330  NA  --  141 141 143  K  --  4.2 4.6 5.0  CL  --  108 109 107  CO2  --  23 26 27   GLUCOSE  --  107* 105* 100*  BUN  --  19 23* 21*  CREATININE  --  1.51* 1.58* 1.80*  CALCIUM  --  8.8* 8.7* 8.6*  PROT 6.0*  --   --   --   ALBUMIN 3.7  --   --   --   AST 47*  --   --   --   ALT 68*  --   --   --   ALKPHOS 46  --   --   --   BILITOT 1.3*  --   --   --   GFRNONAA  --  55* 52* 44*  ANIONGAP  --  10 6 9     Lipids No results for input(s): CHOL, TRIG, HDL, LABVLDL, LDLCALC, CHOLHDL in the last 168 hours.  Hematology Recent Labs  Lab 02/13/21 1446 02/14/21 0727  WBC 4.8 4.2  RBC 5.30 4.94  HGB 16.3 14.9  HCT 50.2 46.5  MCV 94.7 94.1  MCH 30.8 30.2  MCHC 32.5 32.0  RDW 11.9 12.1  PLT  274 247   Thyroid No results for input(s): TSH, FREET4 in the last 168 hours.  BNP Recent Labs  Lab 02/13/21 1533  BNP 1,340.6*    DDimer  Recent Labs  Lab 02/13/21 1533  DDIMER 0.97*     Radiology    ECHOCARDIOGRAM COMPLETE  Result Date: 02/14/2021    ECHOCARDIOGRAM REPORT   Patient Name:   Eddie Dixon Date of Exam: 02/14/2021 Medical Rec #:  [id number]      Height:       69.0 in Accession #:    [id number]     Weight:       236.0 lb Date of Birth:  March 06, 1965     BSA:          2.217 m Patient Age:    56 years       BP:           120/90 mmHg Patient Gender: M              HR:           115 bpm. Exam Location:  Inpatient Procedure: 2D Echo, Cardiac Doppler, Color Doppler and Intracardiac            Opacification Agent Indications:    Atrial fibrillation with RVR  History:        Patient has no prior history of Echocardiogram examinations.                 ETOH abuse.  Sonographer:    Merrie Roof RDCS Referring Phys: [place]  1. Left ventricular ejection fraction, by estimation, is 20 to 25%. The left ventricle has severely decreased function. The left ventricle demonstrates global hypokinesis. There is mild left ventricular hypertrophy.  Left ventricular diastolic parameters  are indeterminate.  2. Right ventricular systolic function is moderately reduced. The right ventricular size is mildly enlarged. There is normal pulmonary artery systolic pressure. The estimated right ventricular systolic pressure is Q000111Q mmHg.  3. Left atrial size was moderately dilated.  4. Right atrial size was severely dilated.  5. The mitral valve is normal in structure. Mild to moderate mitral valve regurgitation. No evidence of mitral stenosis.  6. Tricuspid valve regurgitation is moderate.  7. The aortic valve was not well visualized. Aortic valve regurgitation is not visualized. No aortic stenosis is present.  8. The inferior vena cava is dilated in size with >50% respiratory variability, suggesting right atrial pressure of 8 mmHg. FINDINGS  Left Ventricle: Left ventricular ejection fraction, by estimation, is 20 to 25%. The left ventricle has severely decreased function. The left ventricle demonstrates global hypokinesis. Definity contrast agent was given IV to delineate the left ventricular endocardial borders. The left ventricular internal cavity size was normal in size. There is mild left ventricular hypertrophy. Left ventricular diastolic parameters are indeterminate. Right Ventricle: The right ventricular size is mildly enlarged. Right vetricular wall thickness was not well visualized. Right ventricular systolic function is moderately reduced. There is normal pulmonary artery systolic pressure. The tricuspid regurgitant velocity is 2.02 m/s, and with an assumed right atrial pressure of 8 mmHg, the estimated right ventricular systolic pressure is Q000111Q mmHg. Left Atrium: Left atrial size was moderately dilated. Right Atrium: Right atrial size was severely dilated. Pericardium: There is no evidence of pericardial effusion. Mitral Valve: The mitral valve is normal in structure. Mild to moderate mitral valve regurgitation. No evidence of mitral valve stenosis.  Tricuspid Valve: The tricuspid valve is normal in structure. Tricuspid  valve regurgitation is moderate. Aortic Valve: The aortic valve was not well visualized. Aortic valve regurgitation is not visualized. No aortic stenosis is present. Aortic valve mean gradient measures 2.0 mmHg. Aortic valve peak gradient measures 3.0 mmHg. Aortic valve area, by VTI measures 1.93 cm. Pulmonic Valve: The pulmonic valve was not well visualized. Pulmonic valve regurgitation is trivial. Aorta: The aortic root and ascending aorta are structurally normal, with no evidence of dilitation. Venous: The inferior vena cava is dilated in size with greater than 50% respiratory variability, suggesting right atrial pressure of 8 mmHg. IAS/Shunts: The interatrial septum was not well visualized.  LEFT VENTRICLE PLAX 2D LVIDd:         5.40 cm LVIDs:         4.70 cm LV PW:         1.20 cm LV IVS:        1.20 cm LVOT diam:     1.90 cm LV SV:         25 LV SV Index:   11 LVOT Area:     2.84 cm  RIGHT VENTRICLE            IVC RV Basal diam:  4.60 cm    IVC diam: 2.00 cm RV Mid diam:    3.80 cm RV S prime:     9.25 cm/s TAPSE (M-mode): 1.1 cm LEFT ATRIUM              Index        RIGHT ATRIUM           Index LA diam:        4.10 cm  1.85 cm/m   RA Area:     31.60 cm LA Vol (A2C):   111.0 ml 50.08 ml/m  RA Volume:   115.00 ml 51.88 ml/m LA Vol (A4C):   82.0 ml  36.99 ml/m LA Biplane Vol: 99.4 ml  44.84 ml/m  AORTIC VALVE AV Area (Vmax):    1.86 cm AV Area (Vmean):   1.65 cm AV Area (VTI):     1.93 cm AV Vmax:           86.20 cm/s AV Vmean:          61.100 cm/s AV VTI:            0.128 m AV Peak Grad:      3.0 mmHg AV Mean Grad:      2.0 mmHg LVOT Vmax:         56.60 cm/s LVOT Vmean:        35.600 cm/s LVOT VTI:          0.087 m LVOT/AV VTI ratio: 0.68  AORTA Ao Root diam: 2.90 cm Ao Asc diam:  3.20 cm TRICUSPID VALVE TR Peak grad:   16.3 mmHg TR Vmax:        202.00 cm/s  SHUNTS Systemic VTI:  0.09 m Systemic Diam: 1.90 cm Oswaldo Milian  MD Electronically signed by Oswaldo Milian MD Signature Date/Time: 02/14/2021/5:27:00 PM    Final     Cardiac Studies   Echo 02/14/21   1. Left ventricular ejection fraction, by estimation, is 20 to 25%. The  left ventricle has severely decreased function. The left ventricle  demonstrates global hypokinesis. There is mild left ventricular  hypertrophy. Left ventricular diastolic parameters   are indeterminate.   2. Right ventricular systolic function is moderately reduced. The right  ventricular size is mildly enlarged. There is normal pulmonary artery  systolic pressure. The estimated right ventricular systolic pressure is  Q000111Q mmHg.   3. Left atrial size was moderately dilated.   4. Right atrial size was severely dilated.   5. The mitral valve is normal in structure. Mild to moderate mitral valve  regurgitation. No evidence of mitral stenosis.   6. Tricuspid valve regurgitation is moderate.   7. The aortic valve was not well visualized. Aortic valve regurgitation  is not visualized. No aortic stenosis is present.   8. The inferior vena cava is dilated in size with >50% respiratory  variability, suggesting right atrial pressure of 8 mmHg.   TEE 02/16/21  - Formal read pending   Patient Profile     56 y.o. male with a PMH of ongoing alcohol abuse, newly diagnosed afib, anxiety, tobacco use disorder who was seen by cardiology on  1/21 for evaluation of afib.  Assessment & Plan    Atrial Fibrillation with RVR: in the setting of alcohol abuse. Remains in afib per telemetry with rate 120s-130s - Diagnosed on 1/4 by his PCP - TEE completed on 1/24 showed a clot in the left atrial appendage. Unable to proceed with DC cardioversion - Increase coreg to 25 mg BID for better rate control. Patient is not a good candidate for long term amiodarone as he is young, current clot in [place]. Patient was started on IV diltiazem when first admitted, caused nausea and he has a low EF.  - Continue to  anticoagulate with xarelto 20 mg  - Continue to monitor on telemetry  - Echo this admission showed severely dilated RA, moderately dilated LA. This will likely increase risk of reoccurrence after any future cardioversion.    Systolic HF NYHA Class II: Echo this admission showed LVEF 20-25%, severely decreased LV function and global hypokinesis, moderately reduced RV function, moderately dilated LA, severely dilated RA   - Possibly tachy-mediated or due to heavy alcohol use  - Last dose of lasix given 1/23. Will give Lasix 80mg  IV today.  - Increase coreg - Started entresto on 1/23  - Creatinine/GFR has not improved and patient continues to have AKI, will not start spironolactone until renal function improves. Will consider adding SGLT2i as SCr allows   AKI  - Creatinine was 1.5 on admission (up from 1.2)  - Creatinine 1.8 this AM. Not significantly increased after addition of Entresto yesterday, continue to monitor   For questions or updates, please contact [facility] Please consult [email or web address] for contact info under        Signed, Margie Billet, PA-C  02/16/2021, 10:18 AM

## 2021-02-16 NOTE — Anesthesia Postprocedure Evaluation (Signed)
Anesthesia Post Note  Patient: Eddie Dixon  Procedure(s) Performed: TRANSESOPHAGEAL ECHOCARDIOGRAM (TEE) BUBBLE STUDY     Patient location during evaluation: Endoscopy Anesthesia Type: MAC Level of consciousness: awake and alert Pain management: pain level controlled Vital Signs Assessment: post-procedure vital signs reviewed and stable Respiratory status: spontaneous breathing, nonlabored ventilation, respiratory function stable and patient connected to nasal cannula oxygen Cardiovascular status: stable and blood pressure returned to baseline Postop Assessment: no apparent nausea or vomiting Anesthetic complications: no   No notable events documented.  Last Vitals:  Vitals:   02/16/21 0942 02/16/21 0945  BP: (!) 110/92 111/62  Pulse: (!) 121 (!) 112  Resp: 18 16  Temp:    SpO2: 99% 100%    Last Pain:  Vitals:   02/16/21 1030  TempSrc:   PainSc: 0-No pain                 Belenda Cruise P Dequita Schleicher

## 2021-02-16 NOTE — Anesthesia Preprocedure Evaluation (Signed)
Anesthesia Evaluation  Patient identified by MRN, date of birth, ID band Patient awake    Reviewed: Allergy & Precautions, NPO status , Patient's Chart, lab work & pertinent test results  Airway Mallampati: II  TM Distance: >3 FB Neck ROM: Full    Dental no notable dental hx.    Pulmonary neg pulmonary ROS,    Pulmonary exam normal        Cardiovascular hypertension, Pt. on medications and Pt. on home beta blockers +CHF  + dysrhythmias Atrial Fibrillation  Rhythm:Regular Rate:Normal     Neuro/Psych Anxiety negative neurological ROS     GI/Hepatic negative GI ROS, Neg liver ROS,   Endo/Other  negative endocrine ROS  Renal/GU   negative genitourinary   Musculoskeletal negative musculoskeletal ROS (+)   Abdominal Normal abdominal exam  (+)   Peds  Hematology negative hematology ROS (+)   Anesthesia Other Findings   Reproductive/Obstetrics                             Anesthesia Physical Anesthesia Plan  ASA: 4  Anesthesia Plan: MAC   Post-op Pain Management:    Induction: Intravenous  PONV Risk Score and Plan: 1 and Propofol infusion and Treatment may vary due to age or medical condition  Airway Management Planned: Simple Face Mask, Natural Airway and Nasal Cannula  Additional Equipment: None  Intra-op Plan:   Post-operative Plan: Extubation in OR  Informed Consent: I have reviewed the patients History and Physical, chart, labs and discussed the procedure including the risks, benefits and alternatives for the proposed anesthesia with the patient or authorized representative who has indicated his/her understanding and acceptance.     Dental advisory given  Plan Discussed with: CRNA  Anesthesia Plan Comments: (Lab Results      Component                Value               Date                      WBC                      4.2                 02/14/2021                HGB                       14.9                02/14/2021                HCT                      46.5                02/14/2021                MCV                      94.1                02/14/2021                PLT  247                 02/14/2021           Lab Results      Component                Value               Date                      NA                       143                 02/16/2021                K                        5.0                 02/16/2021                CO2                      27                  02/16/2021                GLUCOSE                  100 (H)             02/16/2021                BUN                      21 (H)              02/16/2021                CREATININE               1.80 (H)            02/16/2021                CALCIUM                  8.6 (L)             02/16/2021                GFRNONAA                 44 (L)              02/16/2021           ECHO 1/23: ventricular ejection fraction, by estimation, is 20 to 25%. The  left ventricle has severely decreased function. The left ventricle  demonstrates global hypokinesis. There is mild left ventricular  hypertrophy. Left ventricular diastolic parameters  are indeterminate.  2. Right ventricular systolic function is moderately reduced. The right  ventricular size is mildly enlarged. There is normal pulmonary artery  systolic pressure. The estimated right ventricular systolic pressure is  65.7 mmHg.  3. Left atrial size was moderately dilated.  4. Right atrial size was severely dilated.  5. The mitral valve is normal in structure. Mild to moderate mitral valve  regurgitation.  No evidence of mitral stenosis.  6. Tricuspid valve regurgitation is moderate.  7. The aortic valve was not well visualized. Aortic valve regurgitation  is not visualized. No aortic stenosis is present.  8. The inferior vena cava is dilated in size with >50% respiratory  variability, suggesting  right atrial pressure of 8 mmHg. )        Anesthesia Quick Evaluation

## 2021-02-16 NOTE — Transfer of Care (Signed)
Immediate Anesthesia Transfer of Care Note  Patient: Eddie Dixon  Procedure(s) Performed: TRANSESOPHAGEAL ECHOCARDIOGRAM (TEE) BUBBLE STUDY  Patient Location: Endoscopy Unit  Anesthesia Type:MAC  Level of Consciousness: awake, alert , patient cooperative and responds to stimulation  Airway & Oxygen Therapy: Patient Spontanous Breathing and Patient connected to nasal cannula oxygen  Post-op Assessment: Report given to RN and Post -op Vital signs reviewed and stable  Post vital signs: Reviewed and stable  Last Vitals:  Vitals Value Taken Time  BP 126/100 02/16/21 0912  Temp    Pulse 114 02/16/21 0915  Resp 19 02/16/21 0915  SpO2 98 % 02/16/21 0915  Vitals shown include unvalidated device data.  Last Pain:  Vitals:   02/16/21 0820  TempSrc: Temporal  PainSc: 0-No pain         Complications: No notable events documented.

## 2021-02-17 ENCOUNTER — Inpatient Hospital Stay (HOSPITAL_COMMUNITY): Payer: Managed Care, Other (non HMO)

## 2021-02-17 ENCOUNTER — Other Ambulatory Visit (HOSPITAL_COMMUNITY): Payer: Self-pay

## 2021-02-17 ENCOUNTER — Inpatient Hospital Stay: Payer: Self-pay

## 2021-02-17 ENCOUNTER — Encounter (HOSPITAL_COMMUNITY): Payer: Self-pay | Admitting: Cardiology

## 2021-02-17 DIAGNOSIS — I5020 Unspecified systolic (congestive) heart failure: Secondary | ICD-10-CM | POA: Diagnosis not present

## 2021-02-17 DIAGNOSIS — F1010 Alcohol abuse, uncomplicated: Secondary | ICD-10-CM | POA: Diagnosis not present

## 2021-02-17 DIAGNOSIS — I5021 Acute systolic (congestive) heart failure: Secondary | ICD-10-CM | POA: Diagnosis not present

## 2021-02-17 DIAGNOSIS — I4891 Unspecified atrial fibrillation: Secondary | ICD-10-CM | POA: Diagnosis not present

## 2021-02-17 DIAGNOSIS — N179 Acute kidney failure, unspecified: Secondary | ICD-10-CM | POA: Diagnosis not present

## 2021-02-17 LAB — BLOOD GAS, VENOUS
Acid-Base Excess: 2.1 mmol/L — ABNORMAL HIGH (ref 0.0–2.0)
Bicarbonate: 25.9 mmol/L (ref 20.0–28.0)
FIO2: 21
O2 Saturation: 84.3 %
Patient temperature: 37
pCO2, Ven: 38.5 mmHg — ABNORMAL LOW (ref 44.0–60.0)
pH, Ven: 7.443 — ABNORMAL HIGH (ref 7.250–7.430)
pO2, Ven: 50 mmHg — ABNORMAL HIGH (ref 32.0–45.0)

## 2021-02-17 LAB — COOXEMETRY PANEL
Carboxyhemoglobin: 1 % (ref 0.5–1.5)
Methemoglobin: 0.8 % (ref 0.0–1.5)
O2 Saturation: 74.3 %
Total hemoglobin: 15.5 g/dL (ref 12.0–16.0)

## 2021-02-17 LAB — BASIC METABOLIC PANEL
Anion gap: 9 (ref 5–15)
BUN: 20 mg/dL (ref 6–20)
CO2: 25 mmol/L (ref 22–32)
Calcium: 8.5 mg/dL — ABNORMAL LOW (ref 8.9–10.3)
Chloride: 105 mmol/L (ref 98–111)
Creatinine, Ser: 1.56 mg/dL — ABNORMAL HIGH (ref 0.61–1.24)
GFR, Estimated: 53 mL/min — ABNORMAL LOW (ref >60)
Glucose, Bld: 104 mg/dL — ABNORMAL HIGH (ref 70–99)
Potassium: 4.1 mmol/L (ref 3.5–5.1)
Sodium: 139 mmol/L (ref 135–145)

## 2021-02-17 LAB — LACTIC ACID, PLASMA: Lactic Acid, Venous: 1.2 mmol/L (ref 0.5–1.9)

## 2021-02-17 MED ORDER — FUROSEMIDE 10 MG/ML IJ SOLN: 80.0000 mg | Freq: Once | INTRAMUSCULAR | Status: DC

## 2021-02-17 MED ORDER — SPIRONOLACTONE 12.5 MG HALF TABLET
12.5000 mg | ORAL_TABLET | Freq: Every day | ORAL | Status: DC
  Administered 2021-02-17 – 2021-02-23 (×7): 12.5 mg via ORAL
  Filled 2021-02-17 (×7): qty 1

## 2021-02-17 MED ORDER — CHLORHEXIDINE GLUCONATE CLOTH 2 % EX PADS
6.0000 | MEDICATED_PAD | Freq: Every day | CUTANEOUS | Status: DC
  Administered 2021-02-17 – 2021-02-23 (×7): 6 via TOPICAL

## 2021-02-17 MED ORDER — HEPARIN (PORCINE) 25000 UT/250ML-% IV SOLN
1300.0000 [IU]/h | INTRAVENOUS | Status: DC
  Administered 2021-02-17 – 2021-02-18 (×2): 1400 [IU]/h via INTRAVENOUS
  Administered 2021-02-19: 1300 [IU]/h via INTRAVENOUS
  Filled 2021-02-17 (×4): qty 250

## 2021-02-17 MED ORDER — FUROSEMIDE 10 MG/ML IJ SOLN: 80.0000 mg | Freq: Two times a day (BID) | INTRAMUSCULAR | Status: DC

## 2021-02-17 MED ORDER — AMIODARONE HCL IN DEXTROSE 360-4.14 MG/200ML-% IV SOLN
60.0000 mg/h | INTRAVENOUS | Status: DC
  Administered 2021-02-17: 16:00:00 60 mg/h via INTRAVENOUS
  Filled 2021-02-17 (×2): qty 200

## 2021-02-17 MED ORDER — AMIODARONE LOAD VIA INFUSION
150.0000 mg | Freq: Once | INTRAVENOUS | Status: AC
  Administered 2021-02-17: 16:00:00 150 mg via INTRAVENOUS
  Filled 2021-02-17: qty 83.34

## 2021-02-17 MED ORDER — AMIODARONE HCL IN DEXTROSE 360-4.14 MG/200ML-% IV SOLN
30.0000 mg/h | INTRAVENOUS | Status: DC
  Administered 2021-02-17 – 2021-02-21 (×8): 30 mg/h via INTRAVENOUS
  Filled 2021-02-17 (×7): qty 200

## 2021-02-17 MED ORDER — FUROSEMIDE 10 MG/ML IJ SOLN
80.0000 mg | Freq: Two times a day (BID) | INTRAMUSCULAR | Status: DC
  Administered 2021-02-17 – 2021-02-19 (×4): 80 mg via INTRAVENOUS
  Filled 2021-02-17 (×4): qty 8

## 2021-02-17 MED ORDER — SODIUM CHLORIDE 0.9% FLUSH: 10.0000 mL | INTRAVENOUS | Status: DC | PRN

## 2021-02-17 MED ORDER — DIGOXIN 125 MCG PO TABS
0.2500 mg | ORAL_TABLET | Freq: Every day | ORAL | Status: DC
  Administered 2021-02-17 – 2021-02-22 (×6): 0.25 mg via ORAL
  Filled 2021-02-17 (×7): qty 2

## 2021-02-17 MED ORDER — SODIUM CHLORIDE 0.9% FLUSH
10.0000 mL | Freq: Two times a day (BID) | INTRAVENOUS | Status: DC
  Administered 2021-02-17: 14:00:00 10 mL
  Administered 2021-02-21: 20 mL
  Administered 2021-02-21 – 2021-02-23 (×4): 10 mL

## 2021-02-17 NOTE — Discharge Instructions (Addendum)

## 2021-02-17 NOTE — Progress Notes (Signed)
PROGRESS NOTE    Eddie Dixon  TML:[id number] DOB: Jun 01, 1965 DOA: 02/13/2021 PCP: Tresa Garter, MD   Brief Narrative:  Eddie Dixon is a 56 y.o. male with medical history significant of EtOH abuse and new diagnosis of A.Fib recently, saw Dr. Allyson Sabal 1/5, started initially on cardizem and xarelto.  Cardizem made him feel sick so he quit taking this.  Plotnikov started him on metoprolol which he quit taking because he felt it gave him stomach cramps.He also stopped taking Xarelto a couple of days ago due to cost.  Presented to ED with ongoing A.Fib RVR, and peripheral edema in legs worsening over the past couple of days as well as weight gain of 15 to 20 pounds.  Upon arrival to ED, he was in A. fib with RVR with rates around 150.  Soft BPs.  CTA chest negative for PE.  Creatinine up to 1.5.  Admitted to hospitalist and cardiology consulted.  Assessment & Plan:   Principal Problem:   New onset of congestive heart failure (HCC) Active Problems:   Atrial fibrillation with RVR (HCC)   Acute HFrEF (heart failure with reduced ejection fraction) (HCC)   AKI (acute kidney injury) (HCC)   ETOH abuse  Atrial fibrillation with RVR: Rates still not controlled, fluctuating between 115-130 at rest.  Coreg increased to 25 mg twice daily by cardiology yesterday however this morning's dose was held due to low blood pressure.  Continues to be on Xarelto.  TEE shows left atrial appendage clot, cardioversion not attempted for that reason.  Amiodarone is being avoided due to his young age and wide side effect profile.  He has been now started on digoxin.  Importance of medication compliance including AC reiterated.  He verbalized understanding.  Acute systolic congestive heart failure: Patient was not hypoxic with no shortness of breath.  Chest x-ray negative for pulmonary edema .  Transthoracic echo shows 20 to 25% ejection fraction.  He initially was given intermittent doses of IV Lasix, starting  yesterday, he has been placed on 80 mg IV twice daily.  TEE shows 30 to 35% ejection fraction.  Started on [place].  Bilateral lower extremity edema improving.  AKI: Baseline creatinine around 1.1 but presented with 1.5 which peaked at 1.8 yesterday but improved to 1.56 today.  Monitor closely.  Chronic alcoholism: Patient himself told me that he drinks about 2 drinks of bourbon daily but also gave me a contradictory statement that he did not drink for the whole week.  Per cardiology note, his wife had informed them that patient drinks a whole bottle of bourbon every day.  Patient denied having any withdrawal symptoms currently or in the past or any hospitalizations either.  I highly doubt that history.  He looks calm with no withdrawal symptoms at this point in time.  We will monitor him closely with CIWA protocol and as needed Ativan.  Prediabetes: Hemoglobin A1c 6.1.  Dietary modification including weight loss recommended.  Obesity: Weight loss counseled and encouraged.  Body mass index is 34.14 kg/m.  Last Weight  Most recent update: 02/17/2021  5:35 AM    Weight  104.9 kg (231 lb 3.2 oz)              DVT prophylaxis:    Code Status: Full Code  Family Communication: None present at bedside.  Plan of care discussed with patient in length and he/she verbalized understanding and agreed with it.  Status is: Inpatient  Remains inpatient appropriate because: Needs better control  of A. fib with RVR       Estimated body mass index is 34.14 kg/m as calculated from the following:   Height as of this encounter:  (1.753 m).   Weight as of this encounter: 104.9 kg.    Nutritional Assessment: Body mass index is 34.14 kg/m.Marland Kitchen Seen by dietician.  I agree with the assessment and plan as outlined below: Nutrition Status:        . Skin Assessment: I have examined the patient's skin and I agree with the wound assessment as performed by the wound care RN as outlined below:     Consultants:  Cardiology  Procedures:  None  Antimicrobials:  Anti-infectives (From admission, onward)    None         Subjective:  Seen and examined.  Again with no complaints.  No chest pain, palpitation or shortness of breath.  He is just wondering when he will be able to go home.  Objective: Vitals:   02/16/21 2023 02/16/21 2355 02/17/21 0420 02/17/21 0818  BP: 97/75 109/90 102/90 98/79  Pulse: (!) 120 (!) 120 99 98  Resp: Temp: 98.2 F (36.8 C) 98.6 F (37 C) 98.2 F (36.8 C) 98.2 F (36.8 C)  TempSrc: Oral Oral Oral Oral  SpO2: 94% 98% 98% 97%  Weight:   104.9 kg   Height:        Intake/Output Summary (Last 24 hours) at 02/17/2021 1125 Last data filed at 02/17/2021 0819 Gross per 24 hour  Intake 954 ml  Output 1050 ml  Net -96 ml    Filed Weights   02/16/21 0406 02/16/21 0820 02/17/21 0420  Weight: 104.3 kg 104.3 kg 104.9 kg    Examination:  General exam: Appears calm and comfortable, obese Respiratory system: Clear to auscultation. Respiratory effort normal. Cardiovascular system: S1 & S2 heard, irregularly irregular rate and rhythm. No JVD, murmurs, rubs, gallops or clicks. No pedal edema. Gastrointestinal system: Abdomen is nondistended, soft and nontender. No organomegaly or masses felt. Normal bowel sounds heard. Central nervous system: Alert and oriented. No focal neurological deficits. Extremities: Symmetric 5 x 5 power. Skin: No rashes, lesions or ulcers.  Psychiatry: Judgement and insight appear normal. Mood & affect appropriate.    Data Reviewed: I have personally reviewed following labs and imaging studies  CBC: Recent Labs  Lab 02/13/21 1446 02/14/21 0727  WBC 4.8 4.2  HGB 16.3 14.9  HCT 50.2 46.5  MCV 94.7 94.1  PLT 274 247    Basic Metabolic Panel: Recent Labs  Lab 02/13/21 1446 02/14/21 0727 02/15/21 0400 02/16/21 0330 02/17/21 0430  NA 139 141 141 143 139  K 5.1 4.2 4.6 5.0 4.1  CL 108 108 109  107 105  CO2 GLUCOSE 116* 107* 105* 100* 104*  BUN 19 19 23* 21* 20  CREATININE 1.52* 1.51* 1.58* 1.80* 1.56*  CALCIUM 9.3 8.8* 8.7* 8.6* 8.5*    GFR: Estimated Creatinine Clearance: 65.4 mL/min (A) (by C-G formula based on SCr of 1.56 mg/dL (H)). Liver Function Tests: Recent Labs  Lab 02/13/21 1533  AST 47*  ALT 68*  ALKPHOS 46  BILITOT 1.3*  PROT 6.0*  ALBUMIN 3.7    No results for input(s): LIPASE, AMYLASE in the last 168 hours. No results for input(s): AMMONIA in the last 168 hours. Coagulation Profile: Recent Labs  Lab 02/13/21 1446  INR 1.1    Cardiac Enzymes: No results for input(s): CKTOTAL, CKMB,  CKMBINDEX, TROPONINI in the last 168 hours. BNP (last 3 results) No results for input(s): PROBNP in the last 8760 hours. HbA1C: Recent Labs    02/16/21 0330  HGBA1C 6.1*   CBG: No results for input(s): GLUCAP in the last 168 hours. Lipid Profile: No results for input(s): CHOL, HDL, LDLCALC, TRIG, CHOLHDL, LDLDIRECT in the last 72 hours. Thyroid Function Tests: No results for input(s): TSH, T4TOTAL, FREET4, T3FREE, THYROIDAB in the last 72 hours. Anemia Panel: No results for input(s): VITAMINB12, FOLATE, FERRITIN, TIBC, IRON, RETICCTPCT in the last 72 hours. Sepsis Labs: No results for input(s): PROCALCITON, LATICACIDVEN in the last 168 hours.  Recent Results (from the past 240 hour(s))  Resp Panel by RT-PCR (Flu A&B, Covid) Nasopharyngeal Swab     Status: None   Collection Time: 02/13/21  9:02 PM   Specimen: Nasopharyngeal Swab; Nasopharyngeal(NP) swabs in vial transport medium  Result Value Ref Range Status   SARS Coronavirus 2 by RT PCR NEGATIVE NEGATIVE Final    Comment: (NOTE) SARS-CoV-2 target nucleic acids are NOT DETECTED.  The SARS-CoV-2 RNA is generally detectable in upper respiratory specimens during the acute phase of infection. The lowest concentration of SARS-CoV-2 viral copies this assay can detect is 138 copies/mL. A  negative result does not preclude SARS-Cov-2 infection and should not be used as the sole basis for treatment or other patient management decisions. A negative result may occur with  improper specimen collection/handling, submission of specimen other than nasopharyngeal swab, presence of viral mutation(s) within the areas targeted by this assay, and inadequate number of viral copies(<138 copies/mL). A negative result must be combined with clinical observations, patient history, and epidemiological information. The expected result is Negative.  Fact Sheet for Patients:  BloggerCourse.com  Fact Sheet for Healthcare Providers:  SeriousBroker.it  This test is no t yet approved or cleared by the [place] FDA and  has been authorized for detection and/or diagnosis of SARS-CoV-2 by FDA under an Emergency Use Authorization (EUA). This EUA will remain  in effect (meaning this test can be used) for the duration of the COVID-19 declaration under Section 564(b)(1) of the Act, 21 U.S.C.section 360bbb-3(b)(1), unless the authorization is terminated  or revoked sooner.       Influenza A by PCR NEGATIVE NEGATIVE Final   Influenza B by PCR NEGATIVE NEGATIVE Final    Comment: (NOTE) The Xpert Xpress SARS-CoV-2/FLU/RSV plus assay is intended as an aid in the diagnosis of influenza from Nasopharyngeal swab specimens and should not be used as a sole basis for treatment. Nasal washings and aspirates are unacceptable for Xpert Xpress SARS-CoV-2/FLU/RSV testing.  Fact Sheet for Patients: BloggerCourse.com  Fact Sheet for Healthcare Providers: SeriousBroker.it  This test is not yet approved or cleared by the [place] FDA and has been authorized for detection and/or diagnosis of SARS-CoV-2 by FDA under an Emergency Use Authorization (EUA). This EUA will remain in effect (meaning this test can  be used) for the duration of the COVID-19 declaration under Section 564(b)(1) of the Act, 21 U.S.C. section 360bbb-3(b)(1), unless the authorization is terminated or revoked.  Performed at [facility], [address] [place], [place] [address]       Radiology Studies: ECHO TEE  Result Date: 02/16/2021    TRANSESOPHOGEAL ECHO REPORT   Patient Name:   Eddie Dixon Date of Exam: 02/16/2021 Medical Rec #:  [id number]      Height:       69.0 in Accession #:    [id number]  Weight:       229.9 lb Date of Birth:  18-Sep-1965     BSA:          2.192 m Patient Age:    55 years       BP:           103/65 mmHg Patient Gender: M              HR:           113 bpm. Exam Location:  Inpatient Procedure: Transesophageal Echo, Limited Color Doppler and Cardiac Doppler Indications:     atrial fibrillation  History:         Patient has prior history of Echocardiogram examinations, most                  recent 02/14/2021. CHF; Risk Factors:Family History of Coronary                  Artery Disease.  Sonographer:     Delcie Roch RDCS Referring Phys:  [id number] Stade Diagnosing Phys: Thomasene Ripple DO PROCEDURE: After discussion of the risks and benefits of a TEE, an informed consent was obtained from the patient. The transesophogeal probe was passed without difficulty through the esophogus of the patient. Imaged were obtained with the patient in a left lateral decubitus position. Local oropharyngeal anesthetic was provided with viscous lidocaine. Sedation performed by different physician. The patient was monitored while under deep sedation. Anesthestetic sedation was provided intravenously by Anesthesiology:  of Propofol. The patient developed no complications during the procedure. IMPRESSIONS  1. Left ventricular ejection fraction, by estimation, is 30 to 35%. The left ventricle has moderately decreased function. The left ventricle demonstrates global hypokinesis. The left ventricular internal cavity  size was mildly dilated.  2. Right ventricular systolic function is mildly reduced. The right ventricular size is normal.  3. Left atrial size was appears dilated visually. A left atrial/left atrial appendage thrombus was detected. The LAA emptying velocity was 22 cm/s.  4. Right atrial size was mildly dilated.  5. The mitral valve is grossly normal. Mild mitral valve regurgitation. No evidence of mitral stenosis.  6. Tricuspid valve regurgitation is mild to moderate.  7. The aortic valve is tricuspid. Aortic valve regurgitation is not visualized. No aortic stenosis is present.  8. Cannot exclude a small PFO. Agitated saline contrast bubble study was positive with shunting observed within 3-6 cardiac cycles suggestive of interatrial shunt. There is a patent foramen ovale with predominantly right to left shunting across the atrial septum. Conclusion(s)/Recommendation(s): Findings are concerning for an interatrial shunt as detailed above. Findings concerning for LA/LAA thrombus. Cardioversion was not performed. Would recommend 4 weeks of anticoagulation prior to further attempts at cardioversion. FINDINGS  Left Ventricle: Left ventricular ejection fraction, by estimation, is 30 to 35%. The left ventricle has moderately decreased function. The left ventricle demonstrates global hypokinesis. The left ventricular internal cavity size was mildly dilated. Right Ventricle: The right ventricular size is normal. No increase in right ventricular wall thickness. Right ventricular systolic function is mildly reduced. Left Atrium: Left atrial size was appears dilated visually. A left atrial/left atrial appendage thrombus was detected. The LAA emptying velocity was 22 cm/s. Right Atrium: Right atrial size was mildly dilated. Pericardium: There is no evidence of pericardial effusion. Mitral Valve: The mitral valve is grossly normal. Mild mitral valve regurgitation. No evidence of mitral valve stenosis. Tricuspid Valve: The  tricuspid valve is grossly normal. Tricuspid valve regurgitation is  mild to moderate. Aortic Valve: The aortic valve is tricuspid. Aortic valve regurgitation is not visualized. No aortic stenosis is present. Pulmonic Valve: The pulmonic valve was not well visualized. Pulmonic valve regurgitation is not visualized. Aorta: The aortic root and ascending aorta are structurally normal, with no evidence of dilitation. There is minimal (Grade I) layered plaque involving the descending aorta and aortic arch. Venous: The left upper pulmonary vein, left lower pulmonary vein, right upper pulmonary vein and right lower pulmonary vein are normal. A normal flow pattern is recorded from the left upper pulmonary vein. IAS/Shunts: Cannot exclude a small PFO. Agitated saline contrast was given intravenously to evaluate for intracardiac shunting. Agitated saline contrast bubble study was positive with shunting observed within 3-6 cardiac cycles suggestive of interatrial shunt. Thomasene Ripple DO Electronically signed by Thomasene Ripple DO Signature Date/Time: 02/16/2021/3:11:24 PM    Final    [place] EKG SITE RITE  Result Date: 02/17/2021 If Site Rite image not attached, placement could not be confirmed due to current cardiac rhythm.   Scheduled Meds:  carvedilol  25 mg Oral BID WC   digoxin  0.25 mg Oral Daily   folic acid  1 mg Oral Daily   furosemide  80 mg Intravenous BID   multivitamin with minerals  1 tablet Oral Daily   rivaroxaban  20 mg Oral Q supper   sacubitril-valsartan  1 tablet Oral BID   sodium chloride flush  3 mL Intravenous Q12H   thiamine  100 mg Oral Daily   Continuous Infusions:  sodium chloride       LOS: 4 days   Time spent: 28 minutes  Hughie Closs, MD [facility]  02/17/2021, 11:25 AM  Please page via Loretha Stapler and do not message via secure chat for anything urgent. Secure chat can be used for anything non urgent.  How to contact the [facility] Attending or Consulting provider 7A - 7P or covering  provider during after hours 7P -7A, for this patient?  Check the care team in [facility] and look for a) attending/consulting [facility] provider listed and b) the [facility] team listed. Page or secure chat 7A-7P. Log into [email or web address] and use Story's universal password to access. If you do not have the password, please contact the hospital operator. Locate the [facility] provider you are looking for under [facility] and page to a number that you can be directly reached. If you still have difficulty reaching the provider, please page the [facility] (Director on Call) for the Hospitalists listed on amion for assistance.

## 2021-02-17 NOTE — Progress Notes (Signed)
Progress Note  Patient Name: Eddie Dixon Date of Encounter: 02/17/2021  [facility] Cardiologist: Quay Burow, MD   Subjective   Eddie Dixon states he feels well. He slept lying at a low incline. We discussed however that his blood pressures are low and we are challenged with controlling his heart rates.  Rates 120s-130s Crt 1.5->1.58->1.8->1.56 (started higher dose IV lasix yesterday 80 mg, was on 40 mv IV prior). Net negative 16 cc yesterday  Weights 235->230->229->231 since admission  BNP 1340 on 02/13/2021   Inpatient Medications    Scheduled Meds:  carvedilol  25 mg Oral BID WC   digoxin  0.25 mg Oral Daily   folic acid  1 mg Oral Daily   furosemide  80 mg Intravenous Once   multivitamin with minerals  1 tablet Oral Daily   rivaroxaban  20 mg Oral Q supper   sacubitril-valsartan  1 tablet Oral BID   sodium chloride flush  3 mL Intravenous Q12H   thiamine  100 mg Oral Daily   Continuous Infusions:  sodium chloride     PRN Meds: sodium chloride, acetaminophen, LORazepam **OR** LORazepam, ondansetron (ZOFRAN) IV, sodium chloride flush, traZODone   Vital Signs    Vitals:   02/16/21 2023 02/16/21 2355 02/17/21 0420 02/17/21 0818  BP: 97/75 109/90 102/90 98/79  Pulse: (!) 120 (!) 120 99 98  Resp: 18 20 19 20   Temp: 98.2 F (36.8 C) 98.6 F (37 C) 98.2 F (36.8 C) 98.2 F (36.8 C)  TempSrc: Oral Oral Oral Oral  SpO2: 94% 98% 98% 97%  Weight:   104.9 kg   Height:        Intake/Output Summary (Last 24 hours) at 02/17/2021 1024 Last data filed at 02/17/2021 0819 Gross per 24 hour  Intake 954 ml  Output 1250 ml  Net -296 ml   Last 3 Weights 02/17/2021 02/16/2021 02/16/2021  Weight (lbs) 231 lb 3.2 oz 229 lb 15 oz 230 lb  Weight (kg) 104.872 kg 104.3 kg 104.327 kg      Telemetry    Atiral fibrillation, rate in 120s-130s - Personally Reviewed  ECG    No new ecg-Personally Reviewed  Physical Exam   Vitals:   02/17/21 0420 02/17/21 0818  BP:  102/90 98/79  Pulse: 99 98  Resp: 19 20  Temp: 98.2 F (36.8 C) 98.2 F (36.8 C)  SpO2: 98% 97%    GEN: No acute distress.   Neck: + JVD Cardiac: Irregular rhythm, no murmurs, rubs, or gallops.  Respiratory: Clear to auscultation bilaterally. GI: non-distended  MS: no edema Skin: warm and well perfused Neuro:  Nonfocal  Psych: Normal affect   Labs    High Sensitivity Troponin:   Recent Labs  Lab 02/13/21 1533  TROPONINIHS 32*     Chemistry Recent Labs  Lab 02/13/21 1533 02/14/21 0727 02/15/21 0400 02/16/21 0330 02/17/21 0430  NA  --    < > 141 143 139  K  --    < > 4.6 5.0 4.1  CL  --    < > 109 107 105  CO2  --    < > 26 27 25   GLUCOSE  --    < > 105* 100* 104*  BUN  --    < > 23* 21* 20  CREATININE  --    < > 1.58* 1.80* 1.56*  CALCIUM  --    < > 8.7* 8.6* 8.5*  PROT 6.0*  --   --   --   --  ALBUMIN 3.7  --   --   --   --   AST 47*  --   --   --   --   ALT 68*  --   --   --   --   ALKPHOS 46  --   --   --   --   BILITOT 1.3*  --   --   --   --   GFRNONAA  --    < > 52* 44* 53*  ANIONGAP  --    < > 6 9 9    < > = values in this interval not displayed.    Lipids No results for input(s): CHOL, TRIG, HDL, LABVLDL, LDLCALC, CHOLHDL in the last 168 hours.  Hematology Recent Labs  Lab 02/13/21 1446 02/14/21 0727  WBC 4.8 4.2  RBC 5.30 4.94  HGB 16.3 14.9  HCT 50.2 46.5  MCV 94.7 94.1  MCH 30.8 30.2  MCHC 32.5 32.0  RDW 11.9 12.1  PLT 274 247   Thyroid No results for input(s): TSH, FREET4 in the last 168 hours.  BNP Recent Labs  Lab 02/13/21 1533  BNP 1,340.6*    DDimer  Recent Labs  Lab 02/13/21 1533  DDIMER 0.97*     Radiology    ECHO TEE  Result Date: 02/16/2021    TRANSESOPHOGEAL ECHO REPORT   Patient Name:   Eddie Dixon Date of Exam: 02/16/2021 Medical Rec #:  [id number]      Height:       69.0 in Accession #:    [id number]     Weight:       229.9 lb Date of Birth:  Jun 09, 1965     BSA:          2.192 m Patient Age:    8 years        BP:           103/65 mmHg Patient Gender: M              HR:           113 bpm. Exam Location:  Inpatient Procedure: Transesophageal Echo, Limited Color Doppler and Cardiac Doppler Indications:     atrial fibrillation  History:         Patient has prior history of Echocardiogram examinations, most                  recent 02/14/2021. CHF; Risk Factors:Family History of Coronary                  Artery Disease.  Sonographer:     Johny Chess RDCS Referring Phys:  Ellis Diagnosing Phys: Berniece Salines DO PROCEDURE: After discussion of the risks and benefits of a TEE, an informed consent was obtained from the patient. The transesophogeal probe was passed without difficulty through the esophogus of the patient. Imaged were obtained with the patient in a left lateral decubitus position. Local oropharyngeal anesthetic was provided with viscous lidocaine. Sedation performed by different physician. The patient was monitored while under deep sedation. Anesthestetic sedation was provided intravenously by Anesthesiology: 280mg  of Propofol. The patient developed no complications during the procedure. IMPRESSIONS  1. Left ventricular ejection fraction, by estimation, is 30 to 35%. The left ventricle has moderately decreased function. The left ventricle demonstrates global hypokinesis. The left ventricular internal cavity size was mildly dilated.  2. Right ventricular systolic function is mildly reduced. The right ventricular size is normal.  3. Left atrial  size was appears dilated visually. A left atrial/left atrial appendage thrombus was detected. The LAA emptying velocity was 22 cm/s.  4. Right atrial size was mildly dilated.  5. The mitral valve is grossly normal. Mild mitral valve regurgitation. No evidence of mitral stenosis.  6. Tricuspid valve regurgitation is mild to moderate.  7. The aortic valve is tricuspid. Aortic valve regurgitation is not visualized. No aortic stenosis is present.  8. Cannot  exclude a small PFO. Agitated saline contrast bubble study was positive with shunting observed within 3-6 cardiac cycles suggestive of interatrial shunt. There is a patent foramen ovale with predominantly right to left shunting across the atrial septum. Conclusion(s)/Recommendation(s): Findings are concerning for an interatrial shunt as detailed above. Findings concerning for LA/LAA thrombus. Cardioversion was not performed. Would recommend 4 weeks of anticoagulation prior to further attempts at cardioversion. FINDINGS  Left Ventricle: Left ventricular ejection fraction, by estimation, is 30 to 35%. The left ventricle has moderately decreased function. The left ventricle demonstrates global hypokinesis. The left ventricular internal cavity size was mildly dilated. Right Ventricle: The right ventricular size is normal. No increase in right ventricular wall thickness. Right ventricular systolic function is mildly reduced. Left Atrium: Left atrial size was appears dilated visually. A left atrial/left atrial appendage thrombus was detected. The LAA emptying velocity was 22 cm/s. Right Atrium: Right atrial size was mildly dilated. Pericardium: There is no evidence of pericardial effusion. Mitral Valve: The mitral valve is grossly normal. Mild mitral valve regurgitation. No evidence of mitral valve stenosis. Tricuspid Valve: The tricuspid valve is grossly normal. Tricuspid valve regurgitation is mild to moderate. Aortic Valve: The aortic valve is tricuspid. Aortic valve regurgitation is not visualized. No aortic stenosis is present. Pulmonic Valve: The pulmonic valve was not well visualized. Pulmonic valve regurgitation is not visualized. Aorta: The aortic root and ascending aorta are structurally normal, with no evidence of dilitation. There is minimal (Grade I) layered plaque involving the descending aorta and aortic arch. Venous: The left upper pulmonary vein, left lower pulmonary vein, right upper pulmonary vein and  right lower pulmonary vein are normal. A normal flow pattern is recorded from the left upper pulmonary vein. IAS/Shunts: Cannot exclude a small PFO. Agitated saline contrast was given intravenously to evaluate for intracardiac shunting. Agitated saline contrast bubble study was positive with shunting observed within 3-6 cardiac cycles suggestive of interatrial shunt. Berniece Salines DO Electronically signed by Berniece Salines DO Signature Date/Time: 02/16/2021/3:11:24 PM    Final     Cardiac Studies   Echo 02/14/21   1. Left ventricular ejection fraction, by estimation, is 20 to 25%. The  left ventricle has severely decreased function. The left ventricle  demonstrates global hypokinesis. There is mild left ventricular  hypertrophy. Left ventricular diastolic parameters   are indeterminate.   2. Right ventricular systolic function is moderately reduced. The right  ventricular size is mildly enlarged. There is normal pulmonary artery  systolic pressure. The estimated right ventricular systolic pressure is  [id number] mmHg.   3. Left atrial size was moderately dilated.   4. Right atrial size was severely dilated.   5. The mitral valve is normal in structure. Mild to moderate mitral valve  regurgitation. No evidence of mitral stenosis.   6. Tricuspid valve regurgitation is moderate.   7. The aortic valve was not well visualized. Aortic valve regurgitation  is not visualized. No aortic stenosis is present.   8. The inferior vena cava is dilated in size with >50% respiratory  variability,  suggesting right atrial pressure of 8 mmHg.   TEE 02/16/21  1. Left ventricular ejection fraction, by estimation, is 30 to 35%. The  left ventricle has moderately decreased function. The left ventricle  demonstrates global hypokinesis. The left ventricular internal cavity size  was mildly dilated.   2. Right ventricular systolic function is mildly reduced. The right  ventricular size is normal.   3. Left atrial size was  appears dilated visually. A left atrial/left  atrial appendage thrombus was detected. The LAA emptying velocity was 22  cm/s.   4. Right atrial size was mildly dilated.   5. The mitral valve is grossly normal. Mild mitral valve regurgitation.  No evidence of mitral stenosis.   6. Tricuspid valve regurgitation is mild to moderate.   7. The aortic valve is tricuspid. Aortic valve regurgitation is not  visualized. No aortic stenosis is present.   8. Cannot exclude a small PFO. Agitated saline contrast bubble study was  positive with shunting observed within 3-6 cardiac cycles suggestive of  interatrial shunt. There is a patent foramen ovale with predominantly  right to left shunting across the  atrial septum.   Patient Profile     56 y.o. male with a PMH of ongoing alcohol abuse, newly diagnosed afib, anxiety, tobacco use disorder who was seen by cardiology on  1/21 for evaluation of afib with rvr now with severe systolic dysfunction  Assessment & Plan    Atrial Fibrillation with RVR: in the setting of alcohol abuse. Remains in afib per telemetry with rate 120s-130s.  - Diagnosed on 1/4 by his PCP; had intermittent use, xarelto prescription is still at the pharmacy, start date 02/14/2021; continue xarelto - TEE completed on 1/24 showed a clot in the left atrial appendage. 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 Echo this admission showed LVEF 20-25%, severely decreased LV function and global hypokinesis, moderately reduced RV function, moderately dilated LA, severely dilated RA . Etiology suspected 2/2 alcohol abuse and/or tachymediated - wedge  likely not significantly high, lying flat comfortably, +JVD, no LE edema, - increase IV lasix 80 mg IV BID (net negative 16) - holding coreg and entresto with soft pressures. May be borderline low output. Ordered a lactate - placing PICC line - consulting CHF for consideration of advanced heart failure therapies  Small PFO: concern for possible small PFO with bubbles seen within 3-6 cycles.   AKI -cardiorenal - diuresing per above - holding arni, spironolactone  ETOH cessation: recommend inpatient substance abuse support if feasible  For questions or updates, please contact [facility] Please consult [email or web address] for contact info under        Signed, Janina Mayo, MD  02/17/2021, 10:24 AM

## 2021-02-17 NOTE — Consult Note (Addendum)
Advanced Heart Failure Team Consult Note   Primary Physician: Cassandria Anger, MD PCP-Cardiologist:  Quay Burow, MD  Reason for Consultation: Acute on chronic systolic CHF  HPI:    Eddie Dixon is seen today for evaluation of acute on chronic systolic CHF at the request of Dr. Harl Bowie with Cardiology.   56 y.o. male with newly diagnosed CHF and Afib. Has hx of ETOH abuse. He established care with Dr. Gwenlyn Found on 01/28/21 for Afib with RVR diagnosed at PCP's office the day prior. Noted increasing dyspnea with exertion for several months. He was started on Xarelto and beta blocker.   Presented to ED 02/13/21 with AF with RVR. Also noted worsening dyspnea X 2-3 months, orthopnea X 3 weeks and LE edema for several days. Had stopped taking metoprolol (d/t abdominal cramping and nausea) and xarelto (d/t cost) several days prior. Beside echo with LVEF 20-25%. BNP 1340. CTA chest negative for PE but did show moderate right pleural effusion. Started on IV lasix and carvedilol. Initially added amiodarone but later stopped d/t concern for chemical conversion with nonadherence with anticoagulation. He was admitted to hospitalist service for management of AF with RVR and acute systolic CHF.   Echo 02/14/2021: EF 20-25%, RV moderately reduced, biatrial enlargement, mild to moderate MR, moderate TR  Has remained in AF with RVR. Unable to proceed with cardioversion on 01/24 d/t LAA thrombus on TEE.  Suboptimal diuresis noted with lasix 40 mg daily, subsequently received 80 mg IV. Charted UOP suboptimal. Patient reports voiding in toilet multiple occasions each day. Weight down 5 lb.  Has not received diuretic so far today. Scr 1.5>1.6>1.8 >1.56 (baseline 1.1-1.2). Had attempted uptitration of GDMT - coreg increased to 12.5 mg BID, digoxin 0.25, entresto 24/26 mg BID. More hypotensive last 24 hrs and medications held with exception of digoxin.  Advanced Heart Failure consulted d/t concern for  low-output CHF and to assess for advanced therapies.  PICC line has been placed for CVP and co-ox monitoring.  Works as a Engineer, manufacturing systems. Chews tobacco, does not smokes cigarettes. Reports consuming a bottle of bourbon a week (records suggest more than this). No drug use.  Father passed from an MI in his early 82s. No known family hx of CHF  Review of Systems: [y] = yes, [ ]  = no   General: Weight gain [ ] ; Weight loss [ ] ; Anorexia [Y]; Fatigue [Y]; Fever [ ] ; Chills [ ] ; Weakness [ ]   Cardiac: Chest pain/pressure [ ] ; Resting SOB [Y]; Exertional SOB [Y]; Orthopnea [ ] ; Pedal Edema [ ] ; Palpitations [Y]; Syncope [ ] ; Presyncope [ ] ; Paroxysmal nocturnal dyspnea[ ]   Pulmonary: Cough [Y]; Wheezing[ ] ; Hemoptysis[ ] ; Sputum [ ] ; Snoring [ ]   GI: Vomiting[ ] ; Dysphagia[ ] ; Melena[ ] ; Hematochezia [ ] ; Heartburn[ ] ; Abdominal pain [ ] ; Constipation [ ] ; Diarrhea [ ] ; BRBPR [ ]   GU: Hematuria[ ] ; Dysuria [ ] ; Nocturia[ ]   Vascular: Pain in legs with walking [ ] ; Pain in feet with lying flat [ ] ; Non-healing sores [ ] ; Stroke [ ] ; TIA [ ] ; Slurred speech [ ] ;  Neuro: Headaches[ ] ; Vertigo[ ] ; Seizures[ ] ; Paresthesias[ ] ;Blurred vision [ ] ; Diplopia [ ] ; Vision changes [ ]   Ortho/Skin: Arthritis [ ] ; Joint pain [ ] ; Muscle pain [ ] ; Joint swelling [ ] ; Back Pain [ ] ; Rash [ ]   Psych: Depression[ ] ; Anxiety[ ]   Heme: Bleeding problems [ ] ; Clotting disorders [ ] ; Anemia [ ]   Endocrine: Diabetes [ ] ;  Thyroid dysfunction[ ]   Home Medications Prior to Admission medications   Medication Sig Start Date End Date Taking? Authorizing Provider  Cholecalciferol (VITAMIN D3) 50 MCG (2000 UT) capsule Take 1 capsule (2,000 Units total) by mouth daily. 01/27/21  Yes Plotnikov, Evie Lacks, MD  Multiple Vitamins-Minerals (MULTIVITAMIN ADULTS) TABS Take 1 tablet by mouth daily.   Yes [provider]  rivaroxaban (XARELTO) 20 MG TABS tablet Take 1 tablet (20 mg total) by mouth daily with supper. 02/12/21   Yes Lorretta Harp, MD  traZODone (DESYREL) 50 MG tablet Take 0.5-1 tablets (25-50 mg total) by mouth at bedtime as needed for sleep. 11/16/20  Yes Plotnikov, Evie Lacks, MD  metoprolol succinate (TOPROL-XL) 50 MG 24 hr tablet Take 0.5 tablets (25 mg total) by mouth in the morning and at bedtime. 02/04/21   Plotnikov, Evie Lacks, MD  tadalafil (CIALIS) 5 MG tablet Take 1 tablet (5 mg total) by mouth daily. Patient not taking: Reported on 02/13/2021 11/16/20   Plotnikov, Evie Lacks, MD    Past Medical History: Past Medical History:  Diagnosis Date   A-fib [facility])    Anxiety    ED (erectile dysfunction)    Elevated blood pressure     Past Surgical History: Past Surgical History:  Procedure Laterality Date   BUBBLE STUDY  02/16/2021   Procedure: BUBBLE STUDY;  Surgeon: Berniece Salines, DO;  Location: [place];  Service: Cardiovascular;;   TEE WITHOUT CARDIOVERSION N/A 02/16/2021   Procedure: TRANSESOPHAGEAL ECHOCARDIOGRAM (TEE);  Surgeon: Berniece Salines, DO;  Location: [facility];  Service: Cardiovascular;  Laterality: N/A;    Family History: Family History  Problem Relation Age of Onset   Heart disease Father 47       MI    Social History: Social History   Socioeconomic History   Marital status: Divorced    Spouse name: Not on file   Number of children: 2   Years of education: Not on file   Highest education level: Some college, no degree  Occupational History   Occupation: Librarian, academic    Comment: [place] [employer]  Tobacco Use   Smoking status: Never   Smokeless tobacco: Current    Types: Chew  Vaping Use   Vaping Use: Never used  Substance and Sexual Activity   Alcohol use: Yes    Alcohol/week: 17.0 standard drinks    Types: 17 Shots of liquor per week    Comment: drinks fifth of liquor per week x 1 year.   Drug use: No   Sexual activity: Yes  Other Topics Concern   Not on file  Social History Narrative   Regular Exercise -  YES         Social Determinants of Health    Financial Resource Strain: High Risk   Difficulty of Paying Living Expenses: Hard  Food Insecurity: No Food Insecurity   Worried About Running Out of Food in the Last Year: Never true   Ran Out of Food in the Last Year: Never true  Transportation Needs: No Transportation Needs   Lack of Transportation (Medical): No   Lack of Transportation (Non-Medical): No  Physical Activity: Not on file  Stress: Not on file  Social Connections: Not on file    Allergies:  No Known Allergies  Objective:    Vital Signs:   Temp:  [97 F (36.1 C)-98.6 F (37 C)] 98.2 F (36.8 C) (01/25 0818) Pulse Rate:  [74-120] 98 (01/25 0818) Resp:  [17-20] 20 (01/25 0818) BP: (84-109)/(64-90) 98/79 (  01/25 0818) SpO2:  [94 %-98 %] 97 % (01/25 0818) Weight:  [104.9 kg] 104.9 kg (01/25 0420) Last BM Date: 02/16/21  Weight change: Filed Weights   02/16/21 0406 02/16/21 0820 02/17/21 0420  Weight: 104.3 kg 104.3 kg 104.9 kg    Intake/Output:   Intake/Output Summary (Last 24 hours) at 02/17/2021 1138 Last data filed at 02/17/2021 0819 Gross per 24 hour  Intake 954 ml  Output 1050 ml  Net -96 ml      Physical Exam    General:  No distress. Sitting up on side of bed. HEENT: normal Neck: supple. JVP to jaw. Carotids 2+ bilat; no bruits. No lymphadenopathy or thyromegaly appreciated. Cor: PMI nondisplaced. Irregular, tachy. No rubs, gallops or murmurs. Lungs: decreased right base, otherwise clear. Abdomen: soft, nontender, nondistended. No hepatosplenomegaly.  Extremities: no cyanosis, clubbing, rash, 2+ edema Neuro: alert & orientedx3, cranial nerves grossly intact. moves all 4 extremities w/o difficulty. Affect pleasant   Telemetry   AF 130s this am  EKG    1/21: AF with rate 153 bpm, poor R wave progression  Labs   Basic Metabolic Panel: Recent Labs  Lab 02/13/21 1446 02/14/21 0727 02/15/21 0400 02/16/21 0330 02/17/21 0430  NA 139 141 141 143 139  K 5.1 4.2 4.6 5.0 4.1  CL  108 108 109 107 105  CO2 22 23 26 27 25   GLUCOSE 116* 107* 105* 100* 104*  BUN 19 19 23* 21* 20  CREATININE 1.52* 1.51* 1.58* 1.80* 1.56*  CALCIUM 9.3 8.8* 8.7* 8.6* 8.5*    Liver Function Tests: Recent Labs  Lab 02/13/21 1533  AST 47*  ALT 68*  ALKPHOS 46  BILITOT 1.3*  PROT 6.0*  ALBUMIN 3.7   No results for input(s): LIPASE, AMYLASE in the last 168 hours. No results for input(s): AMMONIA in the last 168 hours.  CBC: Recent Labs  Lab 02/13/21 1446 02/14/21 0727  WBC 4.8 4.2  HGB 16.3 14.9  HCT 50.2 46.5  MCV 94.7 94.1  PLT 274 247    Cardiac Enzymes: No results for input(s): CKTOTAL, CKMB, CKMBINDEX, TROPONINI in the last 168 hours.  BNP: BNP (last 3 results) Recent Labs    02/13/21 1533  BNP 1,340.6*    ProBNP (last 3 results) No results for input(s): PROBNP in the last 8760 hours.   CBG: No results for input(s): GLUCAP in the last 168 hours.  Coagulation Studies: No results for input(s): LABPROT, INR in the last 72 hours.   Imaging   [place] EKG SITE RITE  Result Date: 02/17/2021 If Site Rite image not attached, placement could not be confirmed due to current cardiac rhythm.    Medications:     Current Medications:  carvedilol  25 mg Oral BID WC   digoxin  0.25 mg Oral Daily   folic acid  1 mg Oral Daily   furosemide  80 mg Intravenous BID   multivitamin with minerals  1 tablet Oral Daily   rivaroxaban  20 mg Oral Q supper   sacubitril-valsartan  1 tablet Oral BID   sodium chloride flush  3 mL Intravenous Q12H   thiamine  100 mg Oral Daily    Infusions:  sodium chloride        Patient Profile   56 y.o. male with history of ETOH abuse and family hx premature CAD. Admitted with AF with RVR and acute systolic CHF (new).  Assessment/Plan   Acute systolic CHF: -Suspect tachymediated +- ETOH.  -Echo 02/14/21: EF 20-25%,  RV moderately reduced, biatrial enlargement, mild to moderate MR, moderate TR -TEE 02/16/21: EF 30-35%, RV mildly  reduced, LAA thrombus, possible small PFO with right to left shunt -Will need R/LHC once diuresed, scheduled for 01/27 -cMRI if no obstructive CAD to explain CM -Suboptimal diuresis so far. Increase lasix to 80 mg IV BID -Concern for possible low-output especially with sluggish output and abdominal discomfort. Lactic acid 1.2. -Will add milrinone if co-ox low -Ordered CVP monitoring -Continue digoxin 0.25 mg daily -Hold beta blocker d/t acute systolic CHF and concern for low output -No ARB/ARNi for now with low blood pressure earlier today -Add spiro 12.5 mg daily -Eventual SGLT2i. A1c 6.1% -TED hose  2. Atrial fibrillation with RVR: -Newly diagnosed earlier this month. In setting of ETOH use -LAA thrombus noted on TEE, DCCV not currently an option -Anticoagulated with Xarelto, missed doses prior to admit d/t cost. Will switch to heparin gtt in anticipation of procedures. -Options for rate control limited. Rate 130s this afternoon on diogxin 0.25. Discussed with Dr. Haroldine Laws. Had hypotension with escalating beta blocker, also not ideal with acute CHF. Cardizem not recommended with severely reduced EF. Only other option for rate control is IV amiodarone knowing there is risk associated with chemical conversion. This was discussed with patient and his significant other at bedside this afternoon. They verbalized understanding. Will give bolus IV amio and start gtt.  3. ETOH abuse: -Cessation discussed -CIWA per [facility] -Consult HF CSW  4. Prediabetes: -A1c 6.1%  5. AKI: -In setting of acute CHF +/- contrast -Scr peaked at 1.8, 1.56 today (baseline 1.1)  6. Right pleural effusion: -Moderate on CTA 01/21 -CXR today pending  Consult TOC HF CSW to assess SDOH needs   Length of Stay: [place], LINDSAY N, PA-C  02/17/2021, 11:38 AM  Advanced Heart Failure Team Pager [phone] (M-F; 7a - 5p)  Please contact [facility] for night-coverage after hours (4p -7a ) and weekends on  [email or web address]   Patient seen and examined with the above-signed Advanced Practice Provider and/or Housestaff. I personally reviewed laboratory data, imaging studies and relevant notes. I independently examined the patient and formulated the important aspects of the plan. I have edited the note to reflect any of my changes or salient points. I have personally discussed the plan with the patient and/or family.   56 y/o male with prediabetes, ETOH abuse. Recent AF with RVR admitted with acute systolic HF - EF 0000000  Has improved somewhat with diuresis but remains in AF with RVR. TEE with well formed density in LAA - possible chronic clot so no DC-CV.   General:  Sitting up in bed  No resp difficulty HEENT: normal Neck: supple. JVP 8-9. Carotids 2+ bilat; no bruits. No lymphadenopathy or thryomegaly appreciated. Cor: PMI nondisplaced. Irregular tachy No rubs, gallops or murmurs. Lungs: clear Abdomen: soft, nontender, nondistended. No hepatosplenomegaly. No bruits or masses. Good bowel sounds. Extremities: no cyanosis, clubbing, rash, edema Neuro: alert & orientedx3, cranial nerves grossly intact. moves all 4 extremities w/o difficulty. Affect pleasant  Very difficult situation. 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 Unable to successfully rate control and DC-CV candidate due to probable LAA clot.   I think the only option is to attempt rate control with IV amio with the acknowledgment that there is a small risk of chemical cardioversion and possible embolic event (including possible CVA). I think this risk is low but certainly not zero. If we leave him in AF with RVR  almost certainly will have worsening cardiomyopathy and potentially end-stage HF. He is agree able to plan,.  Will place PICC to follow CVP and co-ox.   Will also need R/L cath to exclude CAD and possible cMRI.   Glori Bickers, MD  10:22 PM

## 2021-02-17 NOTE — TOC Progression Note (Signed)
Transition of Care [facility]) - Progression Note    Patient Details  Name: Eddie Dixon MRN: [id number] Date of Birth: 12-08-1965  Transition of [facility]) CM/SW Contact  Leone Haven, RN Phone Number: 02/17/2021, 11:28 AM  Clinical Narrative:    [name] the pharmacist has assisted patient with the xarelto for the  [facility] Path savings program, he will give the savings card to the patient in the room.          Expected Discharge Plan and Services                                                 Social Determinants of Health (SDOH) Interventions Food Insecurity Interventions: Intervention Not Indicated Financial Strain Interventions: Intervention Not Indicated Housing Interventions: Intervention Not Indicated Transportation Interventions: Intervention Not Indicated Alcohol Brief Interventions/Follow-up: Alcohol education/Brief advice  Readmission Risk Interventions No flowsheet data found.

## 2021-02-17 NOTE — Progress Notes (Signed)
Spoke with Dr. Wyline Mood regarding BP and medications. Hold for now and she will address.

## 2021-02-17 NOTE — Progress Notes (Signed)
Peripherally Inserted Central Catheter Placement  The IV Nurse has discussed with the patient and/or persons authorized to consent for the patient, the purpose of this procedure and the potential benefits and risks involved with this procedure.  The benefits include less needle sticks, lab draws from the catheter, and the patient may be discharged home with the catheter. Risks include, but not limited to, infection, bleeding, blood clot (thrombus formation), and puncture of an artery; nerve damage and irregular heartbeat and possibility to perform a PICC exchange if needed/ordered by physician.  Alternatives to this procedure were also discussed.  Bard Power PICC patient education guide, fact sheet on infection prevention and patient information card has been provided to patient /or left at bedside.    PICC Placement Documentation  PICC Double Lumen [id number] PICC Right Basilic 40 cm 0 cm (Active)  Indication for Insertion or Continuance of Line Limited venous access - need for IV therapy >5 days (PICC only) 02/17/21 1158  Exposed Catheter (cm) 0 cm 02/17/21 1158  Site Assessment Clean;Intact;Dry 02/17/21 1158  Lumen #1 Status Flushed;Blood return noted 02/17/21 1158  Lumen #2 Status Flushed;Blood return noted;Saline locked 02/17/21 1158  Dressing Type Transparent 02/17/21 1158  Dressing Status Clean;Dry;Intact 02/17/21 1158  Antimicrobial disc in place? Yes 02/17/21 1158  Dressing Change Due 02/24/21 02/17/21 1158       Valentina Shaggy Ramos 02/17/2021, 12:02 PM

## 2021-02-17 NOTE — Progress Notes (Addendum)
Heart Failure Navigator Progress Note  Assessed for Heart & Vascular TOC clinic readiness.  Patient does not meet criteria due to AHF rounding team consulted this hospitalization. [place] clinic appt.   Navigator available for educational resources.   Pricilla Holm, MSN, RN Heart Failure Nurse Navigator [phone]

## 2021-02-17 NOTE — Progress Notes (Signed)
Heart Failure Stewardship Pharmacist Progress Note   PCP: Plotnikov, Georgina Quint, MD PCP-Cardiologist: Nanetta Batty, MD    HPI:  56 yo M with PMH of ongoing alcohol use and new onset afib. He stopped taking his Xarelto due to cost and stopped metoprolol because of stomach cramps. He presented to the ED on 1/21 with afib RVR, shortness of breath, and LE edema. CXR with cardiomegaly and bibasilar atelectasis. ECHO on 1/22 with LVEF of 20-25%. DCCV/TEE today but cardioversion was not completed due to LAA thrombus. LVEF on TEE was 50-55%.  Current HF Medications: Diuretic: furosemide 80 mg IV BID Beta Blocker: carvedilol 25 mg BID ACE/ARB/ARNI: Entresto 24/26 mg BID Other: digoxin 0.25 mg daily  Prior to admission HF Medications: Beta blocker: metoprolol XL 25 mg daily  Pertinent Lab Values: Serum creatinine 1.56, BUN 20, Potassium 4.1, Sodium 139, BNP 1340.6, A1c 6.1  Vital Signs: Weight: 231 lbs (admission weight: 236 lbs) Blood pressure: 90-100/80s  Heart rate: 110s  I/O: -1.2L yesterday; net -  Medication Assistance / Insurance Benefits Check: Does the patient have prescription insurance?  Yes Type of insurance plan: Chartered loss adjuster   Outpatient Pharmacy:  Prior to admission outpatient pharmacy: [facility] Is the patient willing to use [facility] TOC pharmacy at discharge? Yes Is the patient willing to transition their outpatient pharmacy to utilize a [facility] outpatient pharmacy?   Pending    Assessment: 1. Acute systolic CHF (EF 78-29>>56-21%), due to presumed NICM. NYHA class III symptoms. PICC placed.  - Restarted IV lasix 80 mg BID - Continue carvedilol 25 mg BID - Continue Entresto 24/26 mg BID - Consider adding SGLT2i prior to discharge - Agree with adding digoxin 0.25 mg daily - check level in 1 week   Plan: 1) Medication changes recommended at this time: - May need to check coox (once PICC is placed) and add milrinone if coox is low   2) Patient  assistance: - Entresto copay >$600 because he still has a deductible remaining on insurance - however, the [place] copay card can still be used to help pt through deductible  - Farxiga copay $15 per month  3)  Education  - To be completed prior to discharge  Sharen Hones, PharmD, BCPS Heart Failure Engineer, building services Phone [phone]

## 2021-02-17 NOTE — Progress Notes (Signed)
ANTICOAGULATION CONSULT NOTE  Pharmacy Consult for heparin Indication: atrial fibrillation  No Known Allergies  Patient Measurements: Height: 5\' 9"  (175.3 cm) Weight: 104.9 kg (231 lb 3.2 oz) IBW/kg (Calculated) : 70.7 Heparin Dosing Weight: 93kg  Vital Signs: Temp: 98 F (36.7 C) (01/25 1205) Temp Source: Oral (01/25 1205) BP: 114/80 (01/25 1205) Pulse Rate: 84 (01/25 1205)  Labs: Recent Labs    02/15/21 0400 02/16/21 0330 02/17/21 0430  CREATININE 1.58* 1.80* 1.56*    Estimated Creatinine Clearance: 65.4 mL/min (A) (by C-G formula based on SCr of 1.56 mg/dL (H)).   Medical History: Past Medical History:  Diagnosis Date   A-fib [facility])    Anxiety    ED (erectile dysfunction)    Elevated blood pressure     Assessment: 75 yoM with AFib on Xarelto PTA admitted with CHF and AF RVR. TEE demonstrated LAA clot, pt reported noncompliance with Xarelto PTA. Pt will need LHC, switching to IV heparin for now. Last dose of Xarelto was yesterday ~1630.    Goal of Therapy:  Heparin level 0.3-0.7 units/ml aPTT 66-102 seconds Monitor platelets by anticoagulation protocol: Yes   Plan:  Hold Xarelto Start heparin 1400 units/h no bolus at 1430 Check aPTT in 8h  40, PharmD, [place], [facility] Clinical Pharmacist [phone] Please check AMION for all [facility] Pharmacy numbers 02/17/2021

## 2021-02-18 ENCOUNTER — Other Ambulatory Visit (HOSPITAL_COMMUNITY): Payer: Self-pay

## 2021-02-18 DIAGNOSIS — I5021 Acute systolic (congestive) heart failure: Secondary | ICD-10-CM | POA: Diagnosis not present

## 2021-02-18 DIAGNOSIS — I4891 Unspecified atrial fibrillation: Secondary | ICD-10-CM | POA: Diagnosis not present

## 2021-02-18 DIAGNOSIS — N179 Acute kidney failure, unspecified: Secondary | ICD-10-CM | POA: Diagnosis not present

## 2021-02-18 DIAGNOSIS — F1010 Alcohol abuse, uncomplicated: Secondary | ICD-10-CM | POA: Diagnosis not present

## 2021-02-18 LAB — BASIC METABOLIC PANEL
Anion gap: 12 (ref 5–15)
BUN: 19 mg/dL (ref 6–20)
CO2: 28 mmol/L (ref 22–32)
Calcium: 9 mg/dL (ref 8.9–10.3)
Chloride: 101 mmol/L (ref 98–111)
Creatinine, Ser: 1.46 mg/dL — ABNORMAL HIGH (ref 0.61–1.24)
GFR, Estimated: 57 mL/min — ABNORMAL LOW (ref >60)
Glucose, Bld: 112 mg/dL — ABNORMAL HIGH (ref 70–99)
Potassium: 4.1 mmol/L (ref 3.5–5.1)
Sodium: 141 mmol/L (ref 135–145)

## 2021-02-18 LAB — COOXEMETRY PANEL
Carboxyhemoglobin: 0.7 % (ref 0.5–1.5)
Carboxyhemoglobin: 0.4 % — ABNORMAL LOW (ref 0.5–1.5)
Carboxyhemoglobin: 0.6 % (ref 0.5–1.5)
Methemoglobin: 0.7 % (ref 0.0–1.5)
Methemoglobin: 0.8 % (ref 0.0–1.5)
Methemoglobin: 0.8 % (ref 0.0–1.5)
O2 Saturation: 55.3 %
O2 Saturation: 35.4 %
O2 Saturation: 51.3 %
Total hemoglobin: 15.4 g/dL (ref 12.0–16.0)
Total hemoglobin: 15 g/dL (ref 12.0–16.0)
Total hemoglobin: 16.8 g/dL — ABNORMAL HIGH (ref 12.0–16.0)

## 2021-02-18 LAB — HEPARIN LEVEL (UNFRACTIONATED)
Heparin Unfractionated: >1.1 IU/mL — ABNORMAL HIGH (ref 0.30–0.70)
Heparin Unfractionated: >1.1 IU/mL — ABNORMAL HIGH (ref 0.30–0.70)

## 2021-02-18 LAB — CBC
HCT: 45.7 % (ref 39.0–52.0)
Hemoglobin: 15.2 g/dL (ref 13.0–17.0)
MCH: 30.9 pg (ref 26.0–34.0)
MCHC: 33.3 g/dL (ref 30.0–36.0)
MCV: 92.9 fL (ref 80.0–100.0)
Platelets: 218 10*3/uL (ref 150–400)
RBC: 4.92 MIL/uL (ref 4.22–5.81)
RDW: 12.1 % (ref 11.5–15.5)
WBC: 4.3 10*3/uL (ref 4.0–10.5)
nRBC: 0 % (ref 0.0–0.2)

## 2021-02-18 LAB — APTT
aPTT: >200 seconds (ref 24–36)
aPTT: 84 seconds — ABNORMAL HIGH (ref 24–36)
aPTT: 102 seconds — ABNORMAL HIGH (ref 24–36)

## 2021-02-18 NOTE — Progress Notes (Signed)
Heparin qtt was running in picc R arm.. Switched to PIV left hand. Repeat aPTT was drawn from picc few hrs after heparin was switched to piv. Awaitng result. Communicated to the pharmacist.

## 2021-02-18 NOTE — Progress Notes (Signed)
Went over informed consent with the patient at the bedside for the cath procedure scheduled at 1300 on 02/18/2021. Patient verbalize understanding and has no questions. Informed consent signed and placed in chart.

## 2021-02-18 NOTE — Progress Notes (Addendum)
Advanced Heart Failure Rounding Note  PCP-Cardiologist: Nanetta Batty, MD   Subjective:    1/25 Started on amio drip and heparin drip for A fib RVR. LAA thrombus noted. Not a candidate for DC-CV. Diuresing with IV lasix.   I/Os not accurate. Weight down another 3 pounds.   CO-OX 55%.   Denies SOB. Denies chest pain.    Objective:   Weight Range: 103.7 kg Body mass index is 33.76 kg/m.   Vital Signs:   Temp:  [97.7 F (36.5 C)-98.3 F (36.8 C)] 98.3 F (36.8 C) (01/26 0225) Pulse Rate:  [49-100] 49 (01/26 0225) Resp:  [20] 20 (01/26 0015) BP: (90-114)/(65-80) 99/65 (01/26 0225) SpO2:  [96 %-99 %] 96 % (01/26 0225) Weight:  [103.7 kg] 103.7 kg (01/26 0300) Last BM Date: 02/16/21  Weight change: Filed Weights   02/16/21 0820 02/17/21 0420 02/18/21 0300  Weight: 104.3 kg 104.9 kg 103.7 kg    Intake/Output:   Intake/Output Summary (Last 24 hours) at 02/18/2021 0803 Last data filed at 02/18/2021 0600 Gross per 24 hour  Intake 2040.83 ml  Output 1525 ml  Net 515.83 ml      Physical Exam   CVP 10-11 personally checked.  General:  Well appearing. No resp difficulty HEENT: Normal Neck: Supple. JVP 9-10 . Carotids 2+ bilat; no bruits. No lymphadenopathy or thyromegaly appreciated. Cor: PMI nondisplaced. Tachy Irregular rate & rhythm. No rubs, gallops or murmurs. Lungs: Clear Abdomen: Soft, nontender, nondistended. No hepatosplenomegaly. No bruits or masses. Good bowel sounds. Extremities: No cyanosis, clubbing, rash, edema. RUE PICC Neuro: Alert & orientedx3, cranial nerves grossly intact. moves all 4 extremities w/o difficulty. Affect pleasant   Telemetry    Afib RVR 100s with occasional PVCs.   EKG    N/A  Labs    CBC Recent Labs    02/18/21 0230  WBC 4.3  HGB 15.2  HCT 45.7  MCV 92.9  PLT 218   Basic Metabolic Panel Recent Labs    73/42/87 0430 02/18/21 0230  NA 139 141  K 4.1 4.1  CL 105 101  CO2 25 28  GLUCOSE 104* 112*  BUN  20 19  CREATININE 1.56* 1.46*  CALCIUM 8.5* 9.0   Liver Function Tests No results for input(s): AST, ALT, ALKPHOS, BILITOT, PROT, ALBUMIN in the last 72 hours. No results for input(s): LIPASE, AMYLASE in the last 72 hours. Cardiac Enzymes No results for input(s): CKTOTAL, CKMB, CKMBINDEX, TROPONINI in the last 72 hours.  BNP: BNP (last 3 results) Recent Labs    02/13/21 1533  BNP 1,340.6*    ProBNP (last 3 results) No results for input(s): PROBNP in the last 8760 hours.   D-Dimer No results for input(s): DDIMER in the last 72 hours. Hemoglobin A1C Recent Labs    02/16/21 0330  HGBA1C 6.1*   Fasting Lipid Panel No results for input(s): CHOL, HDL, LDLCALC, TRIG, CHOLHDL, LDLDIRECT in the last 72 hours. Thyroid Function Tests No results for input(s): TSH, T4TOTAL, T3FREE, THYROIDAB in the last 72 hours.  Invalid input(s): FREET3  Other results:   Imaging    DG Chest Port 1 View  Result Date: 02/17/2021 CLINICAL DATA:  PICC placement EXAM: PORTABLE CHEST 1 VIEW COMPARISON:  Chest x-ray 02/13/2021 FINDINGS: Right PICC placement with the tip in the region of the SVC. Cardiomegaly and mediastinum appear unchanged. Pulmonary vasculature within normal limits. Mild likely subsegmental atelectasis at the left lung base. No pleural effusion or pneumothorax identified. IMPRESSION: Right PICC placement with  the tip in the region of the SVC. No pneumothorax. Electronically Signed   By: Delaney  Williams M.D.   On: 02/17/2021 12:35  ° °US EKG SITE RITE ° °Result Date: 02/17/2021 °If Site Rite image not attached, placement could not be confirmed due to current cardiac rhythm.  ° ° °Medications:   ° ° °Scheduled Medications: ° Chlorhexidine Gluconate Cloth  6 each Topical Daily  ° digoxin  0.25 mg Oral Daily  ° folic acid  1 mg Oral Daily  ° furosemide  80 mg Intravenous BID  ° multivitamin with minerals  1 tablet Oral Daily  ° sodium chloride flush  10-40 mL Intracatheter Q12H  ° sodium  chloride flush  3 mL Intravenous Q12H  ° spironolactone  12.5 mg Oral Daily  ° thiamine  100 mg Oral Daily  ° ° °Infusions: ° sodium chloride    ° amiodarone 30 mg/hr (02/18/21 0230)  ° heparin 1,400 Units/hr (02/17/21 1534)  ° ° °PRN Medications: °sodium chloride, acetaminophen, LORazepam **OR** LORazepam, ondansetron (ZOFRAN) IV, sodium chloride flush, sodium chloride flush, traZODone ° ° ° °Patient Profile  ° °55 y.o. male with history of ETOH abuse and family hx premature CAD. Admitted with AF with RVR and acute systolic CHF (new). ° °Assessment/Plan  ° °Acute systolic CHF: °-Suspect tachymediated +- ETOH.  °-Echo 02/14/21: EF 20-25%, RV moderately reduced, biatrial enlargement, mild to moderate MR, moderate TR °-TEE 02/16/21: EF 30-35%, RV mildly reduced, LAA thrombus, possible small PFO with right to left shunt °-Plan cath tomorrow.  °-cMRI if no obstructive CAD to explain CM °- CO-OX 55%. CVP 10-11 °- Continue IV lasix 80 mg twice a day.  Needs I/O documented. Discussed with him/staff nurse.  °-Continue digoxin 0.25 mg daily °-No bb for now. SBP soft.  °-No ARB/ARNi for now with low blood pressure earlier today °-Continue spiro 12.5 mg daily °-Eventual SGLT2i.  °-Consult cardiac rehab °- Discussed low salt food choices and limiting fluid to intake to < 2 liters daily  °- Renal function ok.  °  °2. Atrial fibrillation with RVR: °-Newly diagnosed earlier this month. In setting of ETOH use °-LAA thrombus noted on TEE, DCCV not currently an option °- Remains in A fib but rate better controlled on amio drip.  °-On heparin drip  °- Discussed importance of compliance with anticoagulants.  °- will need outpatient sleep study  °- Discussed ETOH cessation.  ° °3. ETOH abuse: °- Discussed importance of cessation.  °-CIWA per [facility] °-Consult HF CSW °  °4. Prediabetes: °-A1c 6.1% °  °5. AKI: °-In setting of acute CHF +/- contrast °-Scr peaked at 1.8, 1.46 today (baseline 1.1) °  °6. Right pleural effusion: °-Moderate on  CTA 01/21 ° °7. LAA Clot  °On heparin drip.  °- As above discussed  ° ° °Length of Stay: 5 ° °Amy Clegg, NP  °02/18/2021, 8:03 AM ° °Advanced Heart Failure Team °Pager [phone] (M-F; 7a - 5p)  °Please contact [facility] for night-coverage after hours (5p -7a ) and weekends on [email or web address] ° °Patient seen and examined with the above-signed Advanced Practice Provider and/or Housestaff. I personally reviewed laboratory data, imaging studies and relevant notes. I independently examined the patient and formulated the important aspects of the plan. I have edited the note to reflect any of my changes or salient points. I have personally discussed the plan with the patient and/or family. ° °Remains in AF. Rate improved on IV amio. Diuresing with IV lasix. Remains on heparin gtt. No bleeding. ° °  General:  Sitting up in bed No resp difficulty HEENT: normal Neck: supple. no JVD. Carotids 2+ bilat; no bruits. No lymphadenopathy or thryomegaly appreciated. Cor: PMI nondisplaced. Irregular rate & rhythm. No rubs, gallops or murmurs. Lungs: clear Abdomen: soft, nontender, nondistended. No hepatosplenomegaly. No bruits or masses. Good bowel sounds. Extremities: no cyanosis, clubbing, rash, edema Neuro: alert & orientedx3, cranial nerves grossly intact. moves all 4 extremities w/o difficulty. Affect pleasant  Continue amio, heparin. Cahnge lasix to po. R/L cath tomorrow and possible cMRI.   Arvilla Meres, MD  5:19 PM

## 2021-02-18 NOTE — TOC CAGE-AID Note (Signed)
Transition of Care [facility]) - CAGE-AID Screening   Patient Details  Name: Eddie Dixon MRN: [id number] Date of Birth: 1965/09/23  [facility] CM/SW Contact:    Norinne Jeane, LCSW Phone Number: 02/18/2021, 12:06 PM   Clinical Narrative: HF CSW met with [name] at bedside to discuss ETOH consult. Patient admits to ETOH use that he drinks a fifth of bourbon every two weeks. CSW offered inpatient/outpatient alcohol counseling and resources and educational materials and pt refused. CSW completed a very brief SDOH screening with the patient who denied having any needs at this time. Mr. Sites reports that he has a primary care doctor, Dr. Alain Marion and uses the [facility] on [address]. 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   [place] will continue to follow throughout discharge.   CAGE-AID Screening:    Have You Ever Felt You Ought to Cut Down on Your Drinking or Drug Use?: No Have People Annoyed You By Critizing Your Drinking Or Drug Use?: No Have You Felt Bad Or Guilty About Your Drinking Or Drug Use?: No Have You Ever Had a Drink or Used Drugs First Thing In The Morning to Steady Your Nerves or to Get Rid of a Hangover?: No CAGE-AID Score: 0  Substance Abuse Education Offered: Yes  Substance abuse interventions: SDOH Screening, Other (must comment) (Patient refused educational materials and denied any concerns with his etoh use.)      Javae Braaten, MSW, LCSW [phone] Heart Failure Social Worker

## 2021-02-18 NOTE — Progress Notes (Signed)
Progress Note  Patient Name: Eddie Dixon Date of Encounter: 02/18/2021  [facility] Cardiologist: Quay Burow, MD   Subjective   Eddie Dixon states he feels well. But mainly in bed. He is still able to lie flat. We discussed that his heart is low functioning and management will take some time  Rates 1 teens, improved  Crt 1.5->1.58->1.8->1.56->1.46 (started higher dose IV lasix yesterday 80 mg, was on 40 mv IV prior). I/O may not be too accurate, toilet flushed prior to collection  Weights 235->230->229->231-> 228 pounds since admission  BNP 1340 on 02/13/2021   Inpatient Medications    Scheduled Meds:  Chlorhexidine Gluconate Cloth  6 each Topical Daily   digoxin  0.25 mg Oral Daily   folic acid  1 mg Oral Daily   furosemide  80 mg Intravenous BID   multivitamin with minerals  1 tablet Oral Daily   sodium chloride flush  10-40 mL Intracatheter Q12H   sodium chloride flush  3 mL Intravenous Q12H   spironolactone  12.5 mg Oral Daily   thiamine  100 mg Oral Daily   Continuous Infusions:  sodium chloride     amiodarone 30 mg/hr (02/18/21 0230)   heparin 1,400 Units/hr (02/18/21 0827)   PRN Meds: sodium chloride, acetaminophen, ondansetron (ZOFRAN) IV, sodium chloride flush, sodium chloride flush, traZODone   Vital Signs    Vitals:   02/18/21 0015 02/18/21 0225 02/18/21 0300 02/18/21 0810  BP: 92/73 99/65  (!) 106/94  Pulse: (!) 53 (!) 49  95  Resp: 20   16  Temp: 98 F (36.7 C) 98.3 F (36.8 C)  97.6 F (36.4 C)  TempSrc: Oral Oral  Oral  SpO2: 99% 96%  98%  Weight:   103.7 kg   Height:        Intake/Output Summary (Last 24 hours) at 02/18/2021 0924 Last data filed at 02/18/2021 0810 Gross per 24 hour  Intake 2040.83 ml  Output 1850 ml  Net 190.83 ml   Last 3 Weights 02/18/2021 02/17/2021 02/16/2021  Weight (lbs) 228 lb 9.9 oz 231 lb 3.2 oz 229 lb 15 oz  Weight (kg) 103.7 kg 104.872 kg 104.3 kg      Telemetry    Atiral fibrillation, rate in one  teens- Personally Reviewed  ECG    No new ecg-Personally Reviewed  Physical Exam   Vitals:   02/18/21 0225 02/18/21 0810  BP: 99/65 (!) 106/94  Pulse: (!) 49 95  Resp:  16  Temp: 98.3 F (36.8 C) 97.6 F (36.4 C)  SpO2: 96% 98%    GEN: sitting up, no distress Neck:- JVD at 90 degrees Cardiac: Irregular rhythm, no murmurs, rubs, or gallops.  Respiratory: Clear to auscultation bilaterally. GI: non-distended  MS: no edema Skin: warm and well perfused Neuro:  Nonfocal  Psych: Normal affect   Labs    High Sensitivity Troponin:   Recent Labs  Lab 02/13/21 1533  TROPONINIHS 32*     Chemistry Recent Labs  Lab 02/13/21 1533 02/14/21 0727 02/16/21 0330 02/17/21 0430 02/18/21 0230  NA  --    < > 143 139 141  K  --    < > 5.0 4.1 4.1  CL  --    < > 107 105 101  CO2  --    < > 27 25 28   GLUCOSE  --    < > 100* 104* 112*  BUN  --    < > 21* 20 19  CREATININE  --    < >  1.80* 1.56* 1.46*  CALCIUM  --    < > 8.6* 8.5* 9.0  PROT 6.0*  --   --   --   --   ALBUMIN 3.7  --   --   --   --   AST 47*  --   --   --   --   ALT 68*  --   --   --   --   ALKPHOS 46  --   --   --   --   BILITOT 1.3*  --   --   --   --   GFRNONAA  --    < > 44* 53* 57*  ANIONGAP  --    < > 9 9 12    < > = values in this interval not displayed.    Lipids No results for input(s): CHOL, TRIG, HDL, LABVLDL, LDLCALC, CHOLHDL in the last 168 hours.  Hematology Recent Labs  Lab 02/13/21 1446 02/14/21 0727 02/18/21 0230  WBC 4.8 4.2 4.3  RBC 5.30 4.94 4.92  HGB 16.3 14.9 15.2  HCT 50.2 46.5 45.7  MCV 94.7 94.1 92.9  MCH 30.8 30.2 30.9  MCHC 32.5 32.0 33.3  RDW 11.9 12.1 12.1  PLT 274 247 218   Thyroid No results for input(s): TSH, FREET4 in the last 168 hours.  BNP Recent Labs  Lab 02/13/21 1533  BNP 1,340.6*    DDimer  Recent Labs  Lab 02/13/21 1533  DDIMER 0.97*     Radiology    DG Chest Port 1 View  Result Date: 02/17/2021 CLINICAL DATA:  PICC placement EXAM: PORTABLE  CHEST 1 VIEW COMPARISON:  Chest x-ray 02/13/2021 FINDINGS: Right PICC placement with the tip in the region of the SVC. Cardiomegaly and mediastinum appear unchanged. Pulmonary vasculature within normal limits. Mild likely subsegmental atelectasis at the left lung base. No pleural effusion or pneumothorax identified. IMPRESSION: Right PICC placement with the tip in the region of the SVC. No pneumothorax. Electronically Signed   By: Ofilia Neas M.D.   On: 02/17/2021 12:35   [place] EKG SITE RITE  Result Date: 02/17/2021 If Site Rite image not attached, placement could not be confirmed due to current cardiac rhythm.   Cardiac Studies   Echo 02/14/21   1. Left ventricular ejection fraction, by estimation, is 20 to 25%. The  left ventricle has severely decreased function. The left ventricle  demonstrates global hypokinesis. There is mild left ventricular  hypertrophy. Left ventricular diastolic parameters   are indeterminate.   2. Right ventricular systolic function is moderately reduced. The right  ventricular size is mildly enlarged. There is normal pulmonary artery  systolic pressure. The estimated right ventricular systolic pressure is  [id number] mmHg.   3. Left atrial size was moderately dilated.   4. Right atrial size was severely dilated.   5. The mitral valve is normal in structure. Mild to moderate mitral valve  regurgitation. No evidence of mitral stenosis.   6. Tricuspid valve regurgitation is moderate.   7. The aortic valve was not well visualized. Aortic valve regurgitation  is not visualized. No aortic stenosis is present.   8. The inferior vena cava is dilated in size with >50% respiratory  variability, suggesting right atrial pressure of 8 mmHg.   TEE 02/16/21  1. Left ventricular ejection fraction, by estimation, is 30 to 35%. The  left ventricle has moderately decreased function. The left ventricle  demonstrates global hypokinesis. The left ventricular internal cavity size  was  mildly dilated.   2. Right ventricular systolic function is mildly reduced. The right  ventricular size is normal.   3. Left atrial size was appears dilated visually. A left atrial/left  atrial appendage thrombus was detected. The LAA emptying velocity was 22  cm/s.   4. Right atrial size was mildly dilated.   5. The mitral valve is grossly normal. Mild mitral valve regurgitation.  No evidence of mitral stenosis.   6. Tricuspid valve regurgitation is mild to moderate.   7. The aortic valve is tricuspid. Aortic valve regurgitation is not  visualized. No aortic stenosis is present.   8. Cannot exclude a small PFO. Agitated saline contrast bubble study was  positive with shunting observed within 3-6 cardiac cycles suggestive of  interatrial shunt. There is a patent foramen ovale with predominantly  right to left shunting across the  atrial septum.   Patient Profile     56 y.o. 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 Echo this admission showed LVEF 20-25%, severely decreased LV function and global hypokinesis, moderately reduced RV function, moderately dilated LA, severely dilated RA . Etiology suspected 2/2 alcohol abuse and/or tachymediated - wedge likely not significantly high, BNP 1300 on admission - consulted CHF, plan to attempt rate control with IV amio understanding risk of stroke with chemical conversion. Trend CO-Ox and CVP. CVP 55; may be considered for inotropic support can see his CO/CI w/ [facility] - plan for [facility] Friday - continue IV lasix 80 mg IV BID (net negative 16) - holding coreg and entresto with soft pressures. May be borderline low cardiac output.  -  PICC line in place -  consulting CHF for consideration of advanced heart failure therapies  Atrial Fibrillation with RVR: in the setting of alcohol abuse. Remains in afib per telemetry with rate 120s-130s.  -rates improved one teens - Diagnosed on 1/4 by his PCP; had intermittent use, xarelto prescription is still at the pharmacy, start date 02/14/2021; continue xarelto - TEE completed on 1/24 showed a clot in the left atrial appendage. Unable to proceed with DC cardioversion -attempting uptitration of carvedilol but held today due to low pressures - started amiodarone gtt per above - started digoxin 0.25 mg daily will continue  Small PFO: concern for possible small PFO with bubbles seen within 3-6 cycles.   AKI -cardiorenal - diuresing per above - holding arni, spironolactone  ETOH cessation: recommend inpatient substance abuse support if feasible  For questions or updates, please contact [place] Please consult [email or web address] for contact info under        Signed, Janina Mayo, MD  02/18/2021, 9:24 AM

## 2021-02-18 NOTE — Progress Notes (Signed)
Co-Ox re-collected and sent.

## 2021-02-18 NOTE — Progress Notes (Signed)
ANTICOAGULATION CONSULT NOTE  Pharmacy Consult for heparin Indication: atrial fibrillation  No Known Allergies  Patient Measurements: Height: 5\' 9"  (175.3 cm) Weight: 103.7 kg (228 lb 9.9 oz) IBW/kg (Calculated) : 70.7 Heparin Dosing Weight: 93kg  Vital Signs: Temp: 97.7 F (36.5 C) (01/26 1238) Temp Source: Oral (01/26 1238) BP: 104/85 (01/26 1238) Pulse Rate: 107 (01/26 1238)  Labs: Recent Labs    02/16/21 0330 02/17/21 0430 02/17/21 2235 02/18/21 0230 02/18/21 1200 02/18/21 1227  HGB  --   --   --  15.2  --   --   HCT  --   --   --  45.7  --   --   PLT  --   --   --  218  --   --   APTT  --   --  >200* 84*  --  102*  HEPARINUNFRC  --   --   --  >1.10* >1.10*  --   CREATININE 1.80* 1.56*  --  1.46*  --   --      Estimated Creatinine Clearance: 69.4 mL/min (A) (by C-G formula based on SCr of 1.46 mg/dL (H)).   Medical History: Past Medical History:  Diagnosis Date   A-fib [facility])    Anxiety    ED (erectile dysfunction)    Elevated blood pressure     Assessment: 48 yoM with AFib on Xarelto PTA admitted with CHF and AF RVR. TEE demonstrated LAA clot, pt reported noncompliance with Xarelto PTA. Pt will need LHC, switching to IV heparin for now. Last dose of Xarelto was yesterday ~1630.  Heparin level came back high as expected >1.1, aPTT is therapeutic on higher end of goal at 102, on 1400 units/hr. Hgb 15.2, plt 218. No s/sx of bleeding or infusion issues.  Goal of Therapy:  Heparin level 0.3-0.7 units/ml aPTT 66-102 seconds Monitor platelets by anticoagulation protocol: Yes   Plan:  Hold Xarelto Reduce heparin to 1300 units/h to keep in goal range Monitor daily HL/aPTT, CBC, and for s/sx of bleeding   40, PharmD, BCCCP Clinical Pharmacist  Phone: [phone] 02/18/2021 1:42 PM  Please check AMION for all [facility] Pharmacy phone numbers After 10:00 PM, call Main Pharmacy [phone]

## 2021-02-18 NOTE — Progress Notes (Signed)
Co-ox collected and sent  

## 2021-02-18 NOTE — TOC Benefit Eligibility Note (Signed)
Patient Teacher, English as a foreign language completed.    The patient is currently admitted and upon discharge could be taking Farxiga 10 mg.  The current 30 day co-pay is, $25.00.   The patient is currently admitted and upon discharge could be taking Jardiance 10 mg.  Requires Prior Authorization  The patient is insured through [place], [place] Patient Advocate Specialist [place] Patient Advocate Team Direct Number: [phone]  Fax: [phone]

## 2021-02-18 NOTE — Progress Notes (Signed)
PROGRESS NOTE    Eddie Dixon  [id number] DOB: 1965/05/29 DOA: 02/13/2021 PCP: Cassandria Anger, MD   Brief Narrative:  Eddie Dixon is a 56 y.o. male with medical history significant of EtOH abuse and new diagnosis of A.Fib recently, saw Dr. Gwenlyn Found 1/5, started initially on cardizem and xarelto.  Cardizem made him feel sick so he quit taking this.  Plotnikov started him on metoprolol which he quit taking because he felt it gave him stomach cramps.He also stopped taking Xarelto a couple of days ago due to cost.  Presented to ED with ongoing A.Fib RVR, and peripheral edema in legs worsening over the past couple of days as well as weight gain of 15 to 20 pounds.  Upon arrival to ED, he was in A. fib with RVR with rates around 150.  Soft BPs.  CTA chest negative for PE.  Creatinine up to 1.5.  Admitted to hospitalist and cardiology consulted.  Assessment & Plan:   Principal Problem:   New onset of congestive heart failure (HCC) Active Problems:   Atrial fibrillation with RVR (HCC)   Acute HFrEF (heart failure with reduced ejection fraction) (HCC)   AKI (acute kidney injury) ([place])   ETOH abuse  Atrial fibrillation with RVR:  Still with suboptimal rate control.  Currently remains on amiodarone infusion.  Digoxin added.  Did not tolerate beta-blockers.   Attempted TEE that was aborted due to left atrial appendage clot. Importance of medication compliance including AC reiterated.  He verbalized understanding.  Acute systolic congestive heart failure: Patient was not hypoxic with no shortness of breath.  Chest x-ray negative for pulmonary edema .  Transthoracic echo shows 20 to 25% ejection fraction.  Currently remains on IV Lasix.  Started on [place].   Started on Aldactone. Clinically responding.   Followed by heart failure team, scheduled for right and left heart cath tomorrow.    AKI: Baseline creatinine around 1.1.  Stabilizing.  Today creatinine is 1.46. Monitor  closely.  Chronic alcoholism: Counseled to quit.  Currently without any withdrawal symptoms.    Prediabetes: Hemoglobin A1c 6.1.  Dietary modification including weight loss recommended.  Obesity: Weight loss counseled and encouraged.  Body mass index is 33.76 kg/m.  Last Weight  Most recent update: 02/18/2021  3:14 AM    Weight  103.7 kg (228 lb 9.9 oz)              DVT prophylaxis: Place TED hose Start: 02/17/21 1346   Code Status: Full Code  Family Communication: None  Status is: Inpatient  Remains inpatient appropriate because: Needs better control of A. fib with RVR Inpatient cardiac procedures planned.      Estimated body mass index is 33.76 kg/m as calculated from the following:   Height as of this encounter: 5\' 9"  (1.753 m).   Weight as of this encounter: 103.7 kg.    Nutritional Assessment: Body mass index is 33.76 kg/m.Marland Kitchen Seen by dietician.  I agree with the assessment and plan as outlined below: Nutrition Status:        . Skin Assessment: I have examined the patient's skin and I agree with the wound assessment as performed by the wound care RN as outlined below:    Consultants:  Cardiology  Procedures:  None  Antimicrobials:  Anti-infectives (From admission, onward)    None         Subjective:  Patient seen and examined.  No overnight events.  Persistent A. fib but rate better controlled.  100-115.  Objective: Vitals:   02/18/21 0300 02/18/21 0810 02/18/21 1200 02/18/21 1238  BP:  (!) 106/94 104/85 104/85  Pulse:  95 (!) 115 (!) 107  Resp:  16 16 16   Temp:  97.6 F (36.4 C) 97.9 F (36.6 C) 97.7 F (36.5 C)  TempSrc:  Oral Oral Oral  SpO2:  98%  98%  Weight: 103.7 kg     Height:        Intake/Output Summary (Last 24 hours) at 02/18/2021 1259 Last data filed at 02/18/2021 1200 Gross per 24 hour  Intake 1800.83 ml  Output 3375 ml  Net -1574.17 ml   Filed Weights   02/16/21 0820 02/17/21 0420 02/18/21 0300  Weight: 104.3  kg 104.9 kg 103.7 kg    Examination:  General: Looks comfortable.  On room air. Cardiovascular: S1-S2 normal.  Irregularly irregular.  Tachycardic. Respiratory: Bilateral clear.  No added sounds. Gastrointestinal: Soft.  Nontender.  Bowel sound present. Ext: Trace bilateral pedal edema.  No cyanosis or clubbing. Neuro: Alert oriented x4.  No focal deficits. Musculoskeletal: No deformities.   Data Reviewed: I have personally reviewed following labs and imaging studies  CBC: Recent Labs  Lab 02/13/21 1446 02/14/21 0727 02/18/21 0230  WBC 4.8 4.2 4.3  HGB 16.3 14.9 15.2  HCT 50.2 46.5 45.7  MCV 94.7 94.1 92.9  PLT 274 247 [id number]   Basic Metabolic Panel: Recent Labs  Lab 02/14/21 0727 02/15/21 0400 02/16/21 0330 02/17/21 0430 02/18/21 0230  NA 141 141 143 139 141  K 4.2 4.6 5.0 4.1 4.1  CL 108 109 107 105 101  CO2 23 26 27 25 28   GLUCOSE 107* 105* 100* 104* 112*  BUN 19 23* 21* 20 19  CREATININE 1.51* 1.58* 1.80* 1.56* 1.46*  CALCIUM 8.8* 8.7* 8.6* 8.5* 9.0   GFR: Estimated Creatinine Clearance: 69.4 mL/min (A) (by C-G formula based on SCr of 1.46 mg/dL (H)). Liver Function Tests: Recent Labs  Lab 02/13/21 1533  AST 47*  ALT 68*  ALKPHOS 46  BILITOT 1.3*  PROT 6.0*  ALBUMIN 3.7   No results for input(s): LIPASE, AMYLASE in the last 168 hours. No results for input(s): AMMONIA in the last 168 hours. Coagulation Profile: Recent Labs  Lab 02/13/21 1446  INR 1.1   Cardiac Enzymes: No results for input(s): CKTOTAL, CKMB, CKMBINDEX, TROPONINI in the last 168 hours. BNP (last 3 results) No results for input(s): PROBNP in the last 8760 hours. HbA1C: Recent Labs    02/16/21 0330  HGBA1C 6.1*   CBG: No results for input(s): GLUCAP in the last 168 hours. Lipid Profile: No results for input(s): CHOL, HDL, LDLCALC, TRIG, CHOLHDL, LDLDIRECT in the last 72 hours. Thyroid Function Tests: No results for input(s): TSH, T4TOTAL, FREET4, T3FREE, THYROIDAB in the  last 72 hours. Anemia Panel: No results for input(s): VITAMINB12, FOLATE, FERRITIN, TIBC, IRON, RETICCTPCT in the last 72 hours. Sepsis Labs: Recent Labs  Lab 02/17/21 1106  LATICACIDVEN 1.2    Recent Results (from the past 240 hour(s))  Resp Panel by RT-PCR (Flu A&B, Covid) Nasopharyngeal Swab     Status: None   Collection Time: 02/13/21  9:02 PM   Specimen: Nasopharyngeal Swab; Nasopharyngeal(NP) swabs in vial transport medium  Result Value Ref Range Status   SARS Coronavirus 2 by RT PCR NEGATIVE NEGATIVE Final    Comment: (NOTE) SARS-CoV-2 target nucleic acids are NOT DETECTED.  The SARS-CoV-2 RNA is generally detectable in upper respiratory specimens during the acute phase of infection. The lowest concentration  of SARS-CoV-2 viral copies this assay can detect is 138 copies/mL. A negative result does not preclude SARS-Cov-2 infection and should not be used as the sole basis for treatment or other patient management decisions. A negative result may occur with  improper specimen collection/handling, submission of specimen other than nasopharyngeal swab, presence of viral mutation(s) within the areas targeted by this assay, and inadequate number of viral copies(<138 copies/mL). A negative result must be combined with clinical observations, patient history, and epidemiological information. The expected result is Negative.  Fact Sheet for Patients:  EntrepreneurPulse.com.au  Fact Sheet for Healthcare Providers:  IncredibleEmployment.be  This test is no t yet approved or cleared by the [place] FDA and  has been authorized for detection and/or diagnosis of SARS-CoV-2 by FDA under an Emergency Use Authorization (EUA). This EUA will remain  in effect (meaning this test can be used) for the duration of the COVID-19 declaration under Section 564(b)(1) of the Act, 21 U.S.C.section 360bbb-3(b)(1), unless the authorization is terminated  or  revoked sooner.       Influenza A by PCR NEGATIVE NEGATIVE Final   Influenza B by PCR NEGATIVE NEGATIVE Final    Comment: (NOTE) The Xpert Xpress SARS-CoV-2/FLU/RSV plus assay is intended as an aid in the diagnosis of influenza from Nasopharyngeal swab specimens and should not be used as a sole basis for treatment. Nasal washings and aspirates are unacceptable for Xpert Xpress SARS-CoV-2/FLU/RSV testing.  Fact Sheet for Patients: EntrepreneurPulse.com.au  Fact Sheet for Healthcare Providers: IncredibleEmployment.be  This test is not yet approved or cleared by the [place] FDA and has been authorized for detection and/or diagnosis of SARS-CoV-2 by FDA under an Emergency Use Authorization (EUA). This EUA will remain in effect (meaning this test can be used) for the duration of the COVID-19 declaration under Section 564(b)(1) of the Act, 21 U.S.C. section 360bbb-3(b)(1), unless the authorization is terminated or revoked.  Performed at [facility], [place] [address], [place], [place] [address]       Radiology Studies: DG Chest Port 1 View  Result Date: 02/17/2021 CLINICAL DATA:  PICC placement EXAM: PORTABLE CHEST 1 VIEW COMPARISON:  Chest x-ray 02/13/2021 FINDINGS: Right PICC placement with the tip in the region of the SVC. Cardiomegaly and mediastinum appear unchanged. Pulmonary vasculature within normal limits. Mild likely subsegmental atelectasis at the left lung base. No pleural effusion or pneumothorax identified. IMPRESSION: Right PICC placement with the tip in the region of the SVC. No pneumothorax. Electronically Signed   By: Ofilia Neas M.D.   On: 02/17/2021 12:35   [place] EKG SITE RITE  Result Date: 02/17/2021 If Site Rite image not attached, placement could not be confirmed due to current cardiac rhythm.   Scheduled Meds:  Chlorhexidine Gluconate Cloth  6 each Topical Daily   digoxin  0.25 mg Oral Daily   folic acid  1  mg Oral Daily   furosemide  80 mg Intravenous BID   multivitamin with minerals  1 tablet Oral Daily   sodium chloride flush  10-40 mL Intracatheter Q12H   sodium chloride flush  3 mL Intravenous Q12H   spironolactone  12.5 mg Oral Daily   thiamine  100 mg Oral Daily   Continuous Infusions:  sodium chloride     amiodarone 30 mg/hr (02/18/21 0230)   heparin 1,400 Units/hr (02/18/21 0827)     LOS: 5 days   Time spent: 35 minutes  Barb Merino, MD [facility]  02/18/2021, 12:59 PM

## 2021-02-18 NOTE — H&P (View-Only) (Signed)
Advanced Heart Failure Rounding Note  PCP-Cardiologist: Nanetta Batty, MD   Subjective:    1/25 Started on amio drip and heparin drip for A fib RVR. LAA thrombus noted. Not a candidate for DC-CV. Diuresing with IV lasix.   I/Os not accurate. Weight down another 3 pounds.   CO-OX 55%.   Denies SOB. Denies chest pain.    Objective:   Weight Range: 103.7 kg Body mass index is 33.76 kg/m.   Vital Signs:   Temp:  [97.7 F (36.5 C)-98.3 F (36.8 C)] 98.3 F (36.8 C) (01/26 0225) Pulse Rate:  [49-100] 49 (01/26 0225) Resp:  [20] 20 (01/26 0015) BP: (90-114)/(65-80) 99/65 (01/26 0225) SpO2:  [96 %-99 %] 96 % (01/26 0225) Weight:  [103.7 kg] 103.7 kg (01/26 0300) Last BM Date: 02/16/21  Weight change: Filed Weights   02/16/21 0820 02/17/21 0420 02/18/21 0300  Weight: 104.3 kg 104.9 kg 103.7 kg    Intake/Output:   Intake/Output Summary (Last 24 hours) at 02/18/2021 0803 Last data filed at 02/18/2021 0600 Gross per 24 hour  Intake 2040.83 ml  Output 1525 ml  Net 515.83 ml      Physical Exam   CVP 10-11 personally checked.  General:  Well appearing. No resp difficulty HEENT: Normal Neck: Supple. JVP 9-10 . Carotids 2+ bilat; no bruits. No lymphadenopathy or thyromegaly appreciated. Cor: PMI nondisplaced. Tachy Irregular rate & rhythm. No rubs, gallops or murmurs. Lungs: Clear Abdomen: Soft, nontender, nondistended. No hepatosplenomegaly. No bruits or masses. Good bowel sounds. Extremities: No cyanosis, clubbing, rash, edema. RUE PICC Neuro: Alert & orientedx3, cranial nerves grossly intact. moves all 4 extremities w/o difficulty. Affect pleasant   Telemetry    Afib RVR 100s with occasional PVCs.   EKG    N/A  Labs    CBC Recent Labs    02/18/21 0230  WBC 4.3  HGB 15.2  HCT 45.7  MCV 92.9  PLT 218   Basic Metabolic Panel Recent Labs    73/42/87 0430 02/18/21 0230  NA 139 141  K 4.1 4.1  CL 105 101  CO2 25 28  GLUCOSE 104* 112*  BUN  20 19  CREATININE 1.56* 1.46*  CALCIUM 8.5* 9.0   Liver Function Tests No results for input(s): AST, ALT, ALKPHOS, BILITOT, PROT, ALBUMIN in the last 72 hours. No results for input(s): LIPASE, AMYLASE in the last 72 hours. Cardiac Enzymes No results for input(s): CKTOTAL, CKMB, CKMBINDEX, TROPONINI in the last 72 hours.  BNP: BNP (last 3 results) Recent Labs    02/13/21 1533  BNP 1,340.6*    ProBNP (last 3 results) No results for input(s): PROBNP in the last 8760 hours.   D-Dimer No results for input(s): DDIMER in the last 72 hours. Hemoglobin A1C Recent Labs    02/16/21 0330  HGBA1C 6.1*   Fasting Lipid Panel No results for input(s): CHOL, HDL, LDLCALC, TRIG, CHOLHDL, LDLDIRECT in the last 72 hours. Thyroid Function Tests No results for input(s): TSH, T4TOTAL, T3FREE, THYROIDAB in the last 72 hours.  Invalid input(s): FREET3  Other results:   Imaging    DG Chest Port 1 View  Result Date: 02/17/2021 CLINICAL DATA:  PICC placement EXAM: PORTABLE CHEST 1 VIEW COMPARISON:  Chest x-ray 02/13/2021 FINDINGS: Right PICC placement with the tip in the region of the SVC. Cardiomegaly and mediastinum appear unchanged. Pulmonary vasculature within normal limits. Mild likely subsegmental atelectasis at the left lung base. No pleural effusion or pneumothorax identified. IMPRESSION: Right PICC placement with  the tip in the region of the SVC. No pneumothorax. Electronically Signed   By: Jannifer Hick M.D.   On: 02/17/2021 12:35   [place] EKG SITE RITE  Result Date: 02/17/2021 If Site Rite image not attached, placement could not be confirmed due to current cardiac rhythm.    Medications:     Scheduled Medications:  Chlorhexidine Gluconate Cloth  6 each Topical Daily   digoxin  0.25 mg Oral Daily   folic acid  1 mg Oral Daily   furosemide  80 mg Intravenous BID   multivitamin with minerals  1 tablet Oral Daily   sodium chloride flush  10-40 mL Intracatheter Q12H   sodium  chloride flush  3 mL Intravenous Q12H   spironolactone  12.5 mg Oral Daily   thiamine  100 mg Oral Daily    Infusions:  sodium chloride     amiodarone 30 mg/hr (02/18/21 0230)   heparin 1,400 Units/hr (02/17/21 1534)    PRN Medications: sodium chloride, acetaminophen, LORazepam **OR** LORazepam, ondansetron (ZOFRAN) IV, sodium chloride flush, sodium chloride flush, traZODone    Patient Profile   56 y.o. male with history of ETOH abuse and family hx premature CAD. Admitted with AF with RVR and acute systolic CHF (new).  Assessment/Plan   Acute systolic CHF: -Suspect tachymediated +- ETOH.  -Echo 02/14/21: EF 20-25%, RV moderately reduced, biatrial enlargement, mild to moderate MR, moderate TR -TEE 02/16/21: EF 30-35%, RV mildly reduced, LAA thrombus, possible small PFO with right to left shunt -Plan cath tomorrow.  -cMRI if no obstructive CAD to explain CM - CO-OX 55%. CVP 10-11 - Continue IV lasix 80 mg twice a day.  Needs I/O documented. Discussed with him/staff nurse.  -Continue digoxin 0.25 mg daily -No bb for now. SBP soft.  -No ARB/ARNi for now with low blood pressure earlier today -Continue spiro 12.5 mg daily -Eventual SGLT2i.  -Consult cardiac rehab - Discussed low salt food choices and limiting fluid to intake to < 2 liters daily  - Renal function ok.    2. Atrial fibrillation with RVR: -Newly diagnosed earlier this month. In setting of ETOH use -LAA thrombus noted on TEE, DCCV not currently an option - Remains in A fib but rate better controlled on amio drip.  -On heparin drip  - Discussed importance of compliance with anticoagulants.  - will need outpatient sleep study  - Discussed ETOH cessation.   3. ETOH abuse: - Discussed importance of cessation.  -CIWA per [facility] -Consult HF CSW   4. Prediabetes: -A1c 6.1%   5. AKI: -In setting of acute CHF +/- contrast -Scr peaked at 1.8, 1.46 today (baseline 1.1)   6. Right pleural effusion: -Moderate on  CTA 01/21  7. LAA Clot  On heparin drip.  - As above discussed    Length of Stay: 5  Amy Clegg, NP  02/18/2021, 8:03 AM  Advanced Heart Failure Team Pager [phone] (M-F; 7a - 5p)  Please contact [facility] for night-coverage after hours (5p -7a ) and weekends on [email or web address]  Patient seen and examined with the above-signed Advanced Practice Provider and/or Housestaff. I personally reviewed laboratory data, imaging studies and relevant notes. I independently examined the patient and formulated the important aspects of the plan. I have edited the note to reflect any of my changes or salient points. I have personally discussed the plan with the patient and/or family.  Remains in AF. Rate improved on IV amio. Diuresing with IV lasix. Remains on heparin gtt. No bleeding.  General:  Sitting up in bed No resp difficulty HEENT: normal Neck: supple. no JVD. Carotids 2+ bilat; no bruits. No lymphadenopathy or thryomegaly appreciated. Cor: PMI nondisplaced. Irregular rate & rhythm. No rubs, gallops or murmurs. Lungs: clear Abdomen: soft, nontender, nondistended. No hepatosplenomegaly. No bruits or masses. Good bowel sounds. Extremities: no cyanosis, clubbing, rash, edema Neuro: alert & orientedx3, cranial nerves grossly intact. moves all 4 extremities w/o difficulty. Affect pleasant  Continue amio, heparin. Cahnge lasix to po. R/L cath tomorrow and possible cMRI.   Arvilla Meres, MD  5:19 PM

## 2021-02-18 NOTE — Progress Notes (Signed)
ANTICOAGULATION CONSULT NOTE  Pharmacy Consult for heparin Indication: atrial fibrillation  No Known Allergies  Patient Measurements: Height: 5\' 9"  (175.3 cm) Weight: 104.9 kg (231 lb 3.2 oz) IBW/kg (Calculated) : 70.7 Heparin Dosing Weight: 93kg  Vital Signs: Temp: 98.3 F (36.8 C) (01/26 0225) Temp Source: Oral (01/26 0225) BP: 99/65 (01/26 0225) Pulse Rate: 49 (01/26 0225)  Labs: Recent Labs    02/16/21 0330 02/17/21 0430 02/17/21 2235 02/18/21 0230  HGB  --   --   --  15.2  HCT  --   --   --  45.7  PLT  --   --   --  218  APTT  --   --  >200* 84*  CREATININE 1.80* 1.56*  --  1.46*     Estimated Creatinine Clearance: 69.9 mL/min (A) (by C-G formula based on SCr of 1.46 mg/dL (H)).   Medical History: Past Medical History:  Diagnosis Date   A-fib [facility])    Anxiety    ED (erectile dysfunction)    Elevated blood pressure     Assessment: 33 yoM with AFib on Xarelto PTA admitted with CHF and AF RVR. TEE demonstrated LAA clot, pt reported noncompliance with Xarelto PTA. Pt will need LHC, switching to IV heparin for now. Last dose of Xarelto was yesterday ~1630.  1/26 AM update:  aPTT therapeutic   Goal of Therapy:  Heparin level 0.3-0.7 units/ml aPTT 66-102 seconds Monitor platelets by anticoagulation protocol: Yes   Plan:  Cont heparin at 1400 units/hr Confirmatory aPTT and heparin level in 6-8 hours  01-30-1983, PharmD, BCPS Clinical Pharmacist Phone: [phone]

## 2021-02-18 NOTE — Plan of Care (Signed)

## 2021-02-19 ENCOUNTER — Encounter (HOSPITAL_COMMUNITY)
Admission: EM | Disposition: A | Discharge status: Home / Self Care | Payer: Self-pay | Source: Ambulatory Visit | Attending: Internal Medicine

## 2021-02-19 ENCOUNTER — Encounter (HOSPITAL_COMMUNITY): Payer: Self-pay | Admitting: Cardiology

## 2021-02-19 ENCOUNTER — Inpatient Hospital Stay (HOSPITAL_COMMUNITY): Payer: Managed Care, Other (non HMO)

## 2021-02-19 DIAGNOSIS — F1010 Alcohol abuse, uncomplicated: Secondary | ICD-10-CM | POA: Diagnosis not present

## 2021-02-19 DIAGNOSIS — N179 Acute kidney failure, unspecified: Secondary | ICD-10-CM | POA: Diagnosis not present

## 2021-02-19 DIAGNOSIS — I5021 Acute systolic (congestive) heart failure: Secondary | ICD-10-CM

## 2021-02-19 DIAGNOSIS — I4891 Unspecified atrial fibrillation: Secondary | ICD-10-CM | POA: Diagnosis not present

## 2021-02-19 HISTORY — PX: RIGHT/LEFT HEART CATH AND CORONARY ANGIOGRAPHY: CATH118266

## 2021-02-19 LAB — POCT I-STAT EG7
Acid-Base Excess: 0 mmol/L (ref 0.0–2.0)
Acid-Base Excess: 4 mmol/L — ABNORMAL HIGH (ref 0.0–2.0)
Acid-Base Excess: 5 mmol/L — ABNORMAL HIGH (ref 0.0–2.0)
Bicarbonate: 24.8 mmol/L (ref 20.0–28.0)
Bicarbonate: 29.4 mmol/L — ABNORMAL HIGH (ref 20.0–28.0)
Bicarbonate: 30.6 mmol/L — ABNORMAL HIGH (ref 20.0–28.0)
Calcium, Ion: 0.71 mmol/L — CL (ref 1.15–1.40)
Calcium, Ion: 0.99 mmol/L — ABNORMAL LOW (ref 1.15–1.40)
Calcium, Ion: 1.12 mmol/L — ABNORMAL LOW (ref 1.15–1.40)
HCT: 38 % — ABNORMAL LOW (ref 39.0–52.0)
HCT: 44 % (ref 39.0–52.0)
HCT: 47 % (ref 39.0–52.0)
Hemoglobin: 12.9 g/dL — ABNORMAL LOW (ref 13.0–17.0)
Hemoglobin: 15 g/dL (ref 13.0–17.0)
Hemoglobin: 16 g/dL (ref 13.0–17.0)
O2 Saturation: 73 %
O2 Saturation: 73 %
O2 Saturation: 77 %
Potassium: 2.8 mmol/L — ABNORMAL LOW (ref 3.5–5.1)
Potassium: 3.6 mmol/L (ref 3.5–5.1)
Potassium: 4 mmol/L (ref 3.5–5.1)
Sodium: 142 mmol/L (ref 135–145)
Sodium: 144 mmol/L (ref 135–145)
Sodium: 149 mmol/L — ABNORMAL HIGH (ref 135–145)
TCO2: 26 mmol/L (ref 22–32)
TCO2: 31 mmol/L (ref 22–32)
TCO2: 32 mmol/L (ref 22–32)
pCO2, Ven: 38.2 mmHg — ABNORMAL LOW (ref 44.0–60.0)
pCO2, Ven: 45 mmHg (ref 44.0–60.0)
pCO2, Ven: 45.9 mmHg (ref 44.0–60.0)
pH, Ven: 7.42 (ref 7.250–7.430)
pH, Ven: 7.424 (ref 7.250–7.430)
pH, Ven: 7.432 — ABNORMAL HIGH (ref 7.250–7.430)
pO2, Ven: 38 mmHg (ref 32.0–45.0)
pO2, Ven: 38 mmHg (ref 32.0–45.0)
pO2, Ven: 40 mmHg (ref 32.0–45.0)

## 2021-02-19 LAB — BASIC METABOLIC PANEL
Anion gap: 7 (ref 5–15)
BUN: 18 mg/dL (ref 6–20)
CO2: 30 mmol/L (ref 22–32)
Calcium: 8.6 mg/dL — ABNORMAL LOW (ref 8.9–10.3)
Chloride: 104 mmol/L (ref 98–111)
Creatinine, Ser: 1.44 mg/dL — ABNORMAL HIGH (ref 0.61–1.24)
GFR, Estimated: 58 mL/min — ABNORMAL LOW (ref >60)
Glucose, Bld: 98 mg/dL (ref 70–99)
Potassium: 3.6 mmol/L (ref 3.5–5.1)
Sodium: 141 mmol/L (ref 135–145)

## 2021-02-19 LAB — CBC
HCT: 46.1 % (ref 39.0–52.0)
Hemoglobin: 15 g/dL (ref 13.0–17.0)
MCH: 30.4 pg (ref 26.0–34.0)
MCHC: 32.5 g/dL (ref 30.0–36.0)
MCV: 93.3 fL (ref 80.0–100.0)
Platelets: 215 10*3/uL (ref 150–400)
RBC: 4.94 MIL/uL (ref 4.22–5.81)
RDW: 12 % (ref 11.5–15.5)
WBC: 4 10*3/uL (ref 4.0–10.5)
nRBC: 0 % (ref 0.0–0.2)

## 2021-02-19 LAB — POCT I-STAT 7, (LYTES, BLD GAS, ICA,H+H)
Acid-Base Excess: 5 mmol/L — ABNORMAL HIGH (ref 0.0–2.0)
Bicarbonate: 30.4 mmol/L — ABNORMAL HIGH (ref 20.0–28.0)
Calcium, Ion: 1.2 mmol/L (ref 1.15–1.40)
HCT: 48 % (ref 39.0–52.0)
Hemoglobin: 16.3 g/dL (ref 13.0–17.0)
O2 Saturation: 95 %
Potassium: 4.2 mmol/L (ref 3.5–5.1)
Sodium: 140 mmol/L (ref 135–145)
TCO2: 32 mmol/L (ref 22–32)
pCO2 arterial: 44.9 mmHg (ref 32.0–48.0)
pH, Arterial: 7.439 (ref 7.350–7.450)
pO2, Arterial: 72 mmHg — ABNORMAL LOW (ref 83.0–108.0)

## 2021-02-19 LAB — HEPARIN LEVEL (UNFRACTIONATED): Heparin Unfractionated: 0.75 IU/mL — ABNORMAL HIGH (ref 0.30–0.70)

## 2021-02-19 LAB — COOXEMETRY PANEL
Carboxyhemoglobin: 0.9 % (ref 0.5–1.5)
Methemoglobin: 0.6 % (ref 0.0–1.5)
O2 Saturation: 61.2 %
Total hemoglobin: 15.4 g/dL (ref 12.0–16.0)

## 2021-02-19 LAB — APTT: aPTT: 95 seconds — ABNORMAL HIGH (ref 24–36)

## 2021-02-19 SURGERY — RIGHT/LEFT HEART CATH AND CORONARY ANGIOGRAPHY
Anesthesia: LOCAL

## 2021-02-19 MED ORDER — LABETALOL HCL 5 MG/ML IV SOLN: 10.0000 mg | INTRAVENOUS | Status: AC | PRN

## 2021-02-19 MED ORDER — HEPARIN (PORCINE) IN NACL 1000-0.9 UT/500ML-% IV SOLN
INTRAVENOUS | Status: DC | PRN
  Administered 2021-02-19 (×2): 500 mL

## 2021-02-19 MED ORDER — FENTANYL CITRATE (PF) 100 MCG/2ML IJ SOLN
INTRAMUSCULAR | Status: DC | PRN
  Administered 2021-02-19: 25 ug via INTRAVENOUS

## 2021-02-19 MED ORDER — HEPARIN SODIUM (PORCINE) 1000 UNIT/ML IJ SOLN
INTRAMUSCULAR | Status: AC
  Filled 2021-02-19: qty 10

## 2021-02-19 MED ORDER — RIVAROXABAN 20 MG PO TABS
20.0000 mg | ORAL_TABLET | Freq: Every day | ORAL | Status: DC
  Administered 2021-02-19 – 2021-02-20 (×2): 20 mg via ORAL
  Filled 2021-02-19 (×2): qty 1

## 2021-02-19 MED ORDER — GADOBUTROL 1 MMOL/ML IV SOLN
10.0000 mL | Freq: Once | INTRAVENOUS | Status: AC | PRN
  Administered 2021-02-19: 10 mL via INTRAVENOUS

## 2021-02-19 MED ORDER — LIDOCAINE HCL (PF) 1 % IJ SOLN
INTRAMUSCULAR | Status: DC | PRN
  Administered 2021-02-19 (×2): 2 mL via INTRADERMAL

## 2021-02-19 MED ORDER — SODIUM CHLORIDE 0.9 % IV SOLN: 250.0000 mL | INTRAVENOUS | Status: DC | PRN

## 2021-02-19 MED ORDER — SODIUM CHLORIDE 0.9 % IV SOLN: INTRAVENOUS | Status: DC

## 2021-02-19 MED ORDER — POTASSIUM CHLORIDE CRYS ER 20 MEQ PO TBCR
40.0000 meq | EXTENDED_RELEASE_TABLET | Freq: Once | ORAL | Status: AC
  Administered 2021-02-19: 40 meq via ORAL
  Filled 2021-02-19: qty 2

## 2021-02-19 MED ORDER — MIDAZOLAM HCL 2 MG/2ML IJ SOLN
INTRAMUSCULAR | Status: AC
  Filled 2021-02-19: qty 2

## 2021-02-19 MED ORDER — APIXABAN 5 MG PO TABS: 5.0000 mg | ORAL_TABLET | Freq: Two times a day (BID) | ORAL | Status: DC

## 2021-02-19 MED ORDER — SODIUM CHLORIDE 0.9% FLUSH: 3.0000 mL | Freq: Two times a day (BID) | INTRAVENOUS | Status: DC

## 2021-02-19 MED ORDER — LIDOCAINE HCL (PF) 1 % IJ SOLN
INTRAMUSCULAR | Status: AC
  Filled 2021-02-19: qty 30

## 2021-02-19 MED ORDER — VERAPAMIL HCL 2.5 MG/ML IV SOLN
INTRAVENOUS | Status: AC
  Filled 2021-02-19: qty 2

## 2021-02-19 MED ORDER — SODIUM CHLORIDE 0.9% FLUSH
3.0000 mL | Freq: Two times a day (BID) | INTRAVENOUS | Status: DC
  Administered 2021-02-19 – 2021-02-23 (×3): 3 mL via INTRAVENOUS

## 2021-02-19 MED ORDER — SODIUM CHLORIDE 0.9% FLUSH: 3.0000 mL | INTRAVENOUS | Status: DC | PRN

## 2021-02-19 MED ORDER — MIDAZOLAM HCL 2 MG/2ML IJ SOLN
INTRAMUSCULAR | Status: DC | PRN
  Administered 2021-02-19: 2 mg via INTRAVENOUS

## 2021-02-19 MED ORDER — VERAPAMIL HCL 2.5 MG/ML IV SOLN
INTRAVENOUS | Status: DC | PRN
  Administered 2021-02-19: 10 mL via INTRA_ARTERIAL

## 2021-02-19 MED ORDER — HYDRALAZINE HCL 20 MG/ML IJ SOLN: 10.0000 mg | INTRAMUSCULAR | Status: AC | PRN

## 2021-02-19 MED ORDER — ACETAMINOPHEN 325 MG PO TABS: 650.0000 mg | ORAL_TABLET | ORAL | Status: DC | PRN

## 2021-02-19 MED ORDER — ONDANSETRON HCL 4 MG/2ML IJ SOLN: 4.0000 mg | Freq: Four times a day (QID) | INTRAMUSCULAR | Status: DC | PRN

## 2021-02-19 MED ORDER — HEPARIN (PORCINE) IN NACL 1000-0.9 UT/500ML-% IV SOLN
INTRAVENOUS | Status: AC
  Filled 2021-02-19: qty 1000

## 2021-02-19 MED ORDER — FENTANYL CITRATE (PF) 100 MCG/2ML IJ SOLN
INTRAMUSCULAR | Status: AC
  Filled 2021-02-19: qty 2

## 2021-02-19 MED ORDER — ASPIRIN 81 MG PO CHEW
81.0000 mg | CHEWABLE_TABLET | ORAL | Status: AC
  Administered 2021-02-19: 81 mg via ORAL
  Filled 2021-02-19: qty 1

## 2021-02-19 MED ORDER — HEPARIN SODIUM (PORCINE) 1000 UNIT/ML IJ SOLN
INTRAMUSCULAR | Status: DC | PRN
  Administered 2021-02-19: 5000 [IU] via INTRAVENOUS

## 2021-02-19 MED ORDER — IOHEXOL 350 MG/ML SOLN
INTRAVENOUS | Status: DC | PRN
  Administered 2021-02-19: 20 mL

## 2021-02-19 SURGICAL SUPPLY — 10 items
CATH 5FR JL3.5 JR4 ANG PIG MP (CATHETERS) ×1 IMPLANT
CATH BALLN WEDGE 5F 110CM (CATHETERS) ×1 IMPLANT
DEVICE RAD COMP TR BAND LRG (VASCULAR PRODUCTS) ×1 IMPLANT
GLIDESHEATH SLEND SS 6F .021 (SHEATH) ×1 IMPLANT
GUIDEWIRE INQWIRE 1.5J.035X260 (WIRE) IMPLANT
INQWIRE 1.5J .035X260CM (WIRE) ×2
KIT HEART LEFT (KITS) ×1 IMPLANT
PACK CARDIAC CATHETERIZATION (CUSTOM PROCEDURE TRAY) ×2 IMPLANT
SHEATH GLIDE SLENDER 4/5FR (SHEATH) ×1 IMPLANT
TRANSDUCER W/STOPCOCK (MISCELLANEOUS) ×2 IMPLANT

## 2021-02-19 NOTE — Progress Notes (Signed)
Progress Note  Patient Name: Eddie Dixon Date of Encounter: 02/19/2021  [facility] Cardiologist: Quay Burow, MD   Subjective   Mr Lavery states he feels well. But mainly in bed. He is still able to lie flat. We discussed that his heart is low functioning and management will take some time  Rates 1 teens, improved  Crt 1.5->1.58->1.8->1.56->1.46->1.4 (started higher dose IV lasix yesterday 80 mg, was on 40 mv IV prior).Net negative 3L  Weights 235->230->229->231-> 228-> 222 pounds since admission  BNP 1340 on 02/13/2021   Inpatient Medications    Scheduled Meds:  Chlorhexidine Gluconate Cloth  6 each Topical Daily   digoxin  0.25 mg Oral Daily   folic acid  1 mg Oral Daily   furosemide  80 mg Intravenous BID   multivitamin with minerals  1 tablet Oral Daily   potassium chloride  40 mEq Oral Once   sodium chloride flush  10-40 mL Intracatheter Q12H   sodium chloride flush  3 mL Intravenous Q12H   sodium chloride flush  3 mL Intravenous Q12H   spironolactone  12.5 mg Oral Daily   thiamine  100 mg Oral Daily   Continuous Infusions:  sodium chloride     sodium chloride     [START ON 02/20/2021] sodium chloride     amiodarone 30 mg/hr (02/19/21 0344)   heparin 1,300 Units/hr (02/19/21 0346)   PRN Meds: sodium chloride, sodium chloride, acetaminophen, ondansetron (ZOFRAN) IV, sodium chloride flush, sodium chloride flush, sodium chloride flush, traZODone   Vital Signs    Vitals:   02/18/21 2338 02/19/21 0418 02/19/21 0722 02/19/21 0812  BP: (!) 83/61 103/82 92/79 109/90  Pulse: 93 78 97 98  Resp: 18 18 19    Temp: 98.1 F (36.7 C) 97.6 F (36.4 C) 97.6 F (36.4 C)   TempSrc: Oral Oral Oral   SpO2: 96% 97% 97%   Weight:  100.8 kg    Height:        Intake/Output Summary (Last 24 hours) at 02/19/2021 0853 Last data filed at 02/19/2021 0726 Gross per 24 hour  Intake 1035.5 ml  Output 4375 ml  Net -3339.5 ml   Last 3 Weights 02/19/2021 02/18/2021  02/17/2021  Weight (lbs) 222 lb 4.8 oz 228 lb 9.9 oz 231 lb 3.2 oz  Weight (kg) 100.835 kg 103.7 kg 104.872 kg      Telemetry    Atiral fibrillation, rates low 100s (improved)- Personally Reviewed  ECG    No new ecg-Personally Reviewed  Physical Exam   Vitals:   02/19/21 0722 02/19/21 0812  BP: 92/79 109/90  Pulse: 97 98  Resp: 19   Temp: 97.6 F (36.4 C)   SpO2: 97%    CVP ~ 9-11 CO-Ox 50s-60  GEN: sitting up, no distress Neck:no JVD at 90 degrees Cardiac: Irregular rhythm, no murmurs, rubs, or gallops.  Respiratory: Clear to auscultation bilaterally. GI: non-distended  MS: no edema Skin: warm and well perfused Neuro:  Nonfocal  Psych: Normal affect   Labs    High Sensitivity Troponin:   Recent Labs  Lab 02/13/21 1533  TROPONINIHS 32*     Chemistry Recent Labs  Lab 02/13/21 1533 02/14/21 0727 02/17/21 0430 02/18/21 0230 02/19/21 0505  NA  --    < > 139 141 141  K  --    < > 4.1 4.1 3.6  CL  --    < > 105 101 104  CO2  --    < > 25 28  30  GLUCOSE  --    < > 104* 112* 98  BUN  --    < > 20 19 18   CREATININE  --    < > 1.56* 1.46* 1.44*  CALCIUM  --    < > 8.5* 9.0 8.6*  PROT 6.0*  --   --   --   --   ALBUMIN 3.7  --   --   --   --   AST 47*  --   --   --   --   ALT 68*  --   --   --   --   ALKPHOS 46  --   --   --   --   BILITOT 1.3*  --   --   --   --   GFRNONAA  --    < > 53* 57* 58*  ANIONGAP  --    < > 9 12 7    < > = values in this interval not displayed.    Lipids No results for input(s): CHOL, TRIG, HDL, LABVLDL, LDLCALC, CHOLHDL in the last 168 hours.  Hematology Recent Labs  Lab 02/14/21 0727 02/18/21 0230 02/19/21 0505  WBC 4.2 4.3 4.0  RBC 4.94 4.92 4.94  HGB 14.9 15.2 15.0  HCT 46.5 45.7 46.1  MCV 94.1 92.9 93.3  MCH 30.2 30.9 30.4  MCHC 32.0 33.3 32.5  RDW 12.1 12.1 12.0  PLT 247 218 215   Thyroid No results for input(s): TSH, FREET4 in the last 168 hours.  BNP Recent Labs  Lab 02/13/21 1533  BNP 1,340.6*     DDimer  Recent Labs  Lab 02/13/21 1533  DDIMER 0.97*     Radiology    DG Chest Port 1 View  Result Date: 02/17/2021 CLINICAL DATA:  PICC placement EXAM: PORTABLE CHEST 1 VIEW COMPARISON:  Chest x-ray 02/13/2021 FINDINGS: Right PICC placement with the tip in the region of the SVC. Cardiomegaly and mediastinum appear unchanged. Pulmonary vasculature within normal limits. Mild likely subsegmental atelectasis at the left lung base. No pleural effusion or pneumothorax identified. IMPRESSION: Right PICC placement with the tip in the region of the SVC. No pneumothorax. Electronically Signed   By: Ofilia Neas M.D.   On: 02/17/2021 12:35   [place] EKG SITE RITE  Result Date: 02/17/2021 If Site Rite image not attached, placement could not be confirmed due to current cardiac rhythm.   Cardiac Studies   Echo 02/14/21   1. Left ventricular ejection fraction, by estimation, is 20 to 25%. The  left ventricle has severely decreased function. The left ventricle  demonstrates global hypokinesis. There is mild left ventricular  hypertrophy. Left ventricular diastolic parameters   are indeterminate.   2. Right ventricular systolic function is moderately reduced. The right  ventricular size is mildly enlarged. There is normal pulmonary artery  systolic pressure. The estimated right ventricular systolic pressure is  [id number] mmHg.   3. Left atrial size was moderately dilated.   4. Right atrial size was severely dilated.   5. The mitral valve is normal in structure. Mild to moderate mitral valve  regurgitation. No evidence of mitral stenosis.   6. Tricuspid valve regurgitation is moderate.   7. The aortic valve was not well visualized. Aortic valve regurgitation  is not visualized. No aortic stenosis is present.   8. The inferior vena cava is dilated in size with >50% respiratory  variability, suggesting right atrial pressure of 8 mmHg.   TEE 02/16/21  1. Left ventricular ejection fraction, by  estimation, is 30 to 35%. The  left ventricle has moderately decreased function. The left ventricle  demonstrates global hypokinesis. The left ventricular internal cavity size  was mildly dilated.   2. Right ventricular systolic function is mildly reduced. The right  ventricular size is normal.   3. Left atrial size was appears dilated visually. A left atrial/left  atrial appendage thrombus was detected. The LAA emptying velocity was 22  cm/s.   4. Right atrial size was mildly dilated.   5. The mitral valve is grossly normal. Mild mitral valve regurgitation.  No evidence of mitral stenosis.   6. Tricuspid valve regurgitation is mild to moderate.   7. The aortic valve is tricuspid. Aortic valve regurgitation is not  visualized. No aortic stenosis is present.   8. Cannot exclude a small PFO. Agitated saline contrast bubble study was  positive with shunting observed within 3-6 cardiac cycles suggestive of  interatrial shunt. There is a patent foramen ovale with predominantly  right to left shunting across the  atrial septum.   Patient Profile     56 y.o. 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 Echo this admission showed LVEF 20-25%, severely decreased LV function and global hypokinesis, moderately reduced RV function, moderately dilated LA, severely dilated RA . Etiology suspected 2/2 alcohol abuse and/or tachymediated - wedge likely not significantly high, BNP 1300 on admission - consulted CHF, plan to attempt rate control with IV amio understanding risk of stroke with chemical conversion. Trend CO-Ox and CVP. CVP not significantly elevated. Co-Ox 50s; may be considered for inotropic  support can see his CO/CI w/ RHC - plan for LHC/RHC today - continue IV lasix 80 mg IV BID (CVP still mildly elevated) - holding coreg and entresto with soft pressures. May be borderline low cardiac output.  -  PICC line in place - CHF consulted and following for consideration of advanced heart failure therapies  Atrial Fibrillation with RVR: in the setting of alcohol abuse. Remains in afib per telemetry with rate 120s-130s.  -rates improved one teens - Diagnosed on 1/4 by his PCP; had intermittent use, xarelto prescription is still at the pharmacy, start date 02/14/2021; continue xarelto - TEE completed on 1/24 showed a clot in the left atrial appendage. Unable to proceed with DC cardioversion -attempting uptitration of carvedilol but held today due to low pressures - started amiodarone gtt per above - continue heparin gtt - started digoxin 0.25 mg daily will continue  Small PFO: concern for possible small PFO with bubbles seen within 3-6 cycles.   AKI -cardiorenal - diuresing per above - holding arni, spironolactone  ETOH cessation: recommend inpatient substance abuse support if feasible  For questions or updates, please contact [facility] Please consult [email or web address] for contact info under        Signed, Janina Mayo, MD  02/19/2021, 8:53 AM

## 2021-02-19 NOTE — Progress Notes (Signed)
ANTICOAGULATION CONSULT NOTE  Pharmacy Consult for heparin Indication: atrial fibrillation  No Known Allergies  Patient Measurements: Height: 5\' 9"  (175.3 cm) Weight: 100.8 kg (222 lb 4.8 oz) IBW/kg (Calculated) : 70.7 Heparin Dosing Weight: 93kg  Vital Signs: Temp: 97.6 F (36.4 C) (01/27 0722) Temp Source: Oral (01/27 0722) BP: 92/79 (01/27 0722) Pulse Rate: 97 (01/27 0722)  Labs: Recent Labs    02/17/21 0430 02/17/21 2235 02/18/21 0230 02/18/21 1200 02/18/21 1227 02/19/21 0505  HGB  --   --  15.2  --   --  15.0  HCT  --   --  45.7  --   --  46.1  PLT  --   --  218  --   --  215  APTT  --    < > 84*  --  102* 95*  HEPARINUNFRC  --   --  >1.10* >1.10*  --  0.75*  CREATININE 1.56*  --  1.46*  --   --  1.44*   < > = values in this interval not displayed.     Estimated Creatinine Clearance: 69.4 mL/min (A) (by C-G formula based on SCr of 1.44 mg/dL (H)).   Medical History: Past Medical History:  Diagnosis Date   A-fib [facility])    Anxiety    ED (erectile dysfunction)    Elevated blood pressure     Assessment: 62 yoM with AFib on Xarelto PTA admitted with CHF and AF RVR. TEE demonstrated LAA clot, pt reported noncompliance with Xarelto PTA. Pt will need LHC, switching to IV heparin for now.   Heparin level above goal due to influence of recent rivaroxaban, aPTT is therapeutic, CBC stable.  Goal of Therapy:  Heparin level 0.3-0.7 units/ml aPTT 66-102 seconds Monitor platelets by anticoagulation protocol: Yes   Plan:  Heparin 1300 units/h Daily aPTT and heparin level  40, PharmD, [place], [facility] Clinical Pharmacist [phone] Please check AMION for all [facility] numbers 02/19/2021

## 2021-02-19 NOTE — Telephone Encounter (Signed)
Tried calling patient. No answer, voicemail full unable to leave a message.

## 2021-02-19 NOTE — Progress Notes (Signed)
PROGRESS NOTE    Eddie Dixon  [id number] DOB: 1966/01/08 DOA: 02/13/2021 PCP: Cassandria Anger, MD   Brief Narrative:  Eddie Dixon is a 56 y.o. male with medical history significant of EtOH abuse and new diagnosis of A.Fib recently, saw Dr. Gwenlyn Found 1/5, started initially on cardizem and xarelto.  Cardizem made him feel sick so he quit taking this.  Plotnikov started him on metoprolol which he quit taking because he felt it gave him stomach cramps.He also stopped taking Xarelto a couple of days ago due to cost.  Presented to ED with ongoing A.Fib RVR, and peripheral edema in legs worsening over the past couple of days as well as weight gain of 15 to 20 pounds.  Upon arrival to ED, he was in A. fib with RVR with rates around 150.  Soft BPs.  CTA chest negative for PE.  Creatinine up to 1.5.  Admitted to hospitalist and cardiology consulted.  Assessment & Plan:   Principal Problem:   New onset of congestive heart failure (HCC) Active Problems:   Atrial fibrillation with RVR (HCC)   Acute HFrEF (heart failure with reduced ejection fraction) (HCC)   AKI (acute kidney injury) ([place])   ETOH abuse  Atrial fibrillation with RVR:  Persistent A. fib.  Still with suboptimal rate control.  Currently remains on amiodarone infusion.  Digoxin added.  Did not tolerate beta-blockers.   Attempted TEE that was aborted due to left atrial appendage clot. Importance of medication compliance including AC reiterated.   Currently on heparin infusion perioperative.  Acute systolic congestive heart failure: Transthoracic echo shows 20 to 25% ejection fraction.  Currently remains on IV Lasix.  Started on [place].   Started on Aldactone. Clinically responding.   Followed by heart failure team, scheduled for right and left heart cath today.  AKI: Baseline creatinine around 1.1.  Stabilizing.  Today creatinine is 1.4. Monitor closely.  Chronic alcoholism: Counseled to quit.  Currently without any  withdrawal symptoms.    Prediabetes: Hemoglobin A1c 6.1.  Dietary modification including weight loss recommended.  Obesity: Weight loss counseled and encouraged.  Body mass index is 32.83 kg/m.  Last Weight  Most recent update: 02/19/2021  5:07 AM    Weight  100.8 kg (222 lb 4.8 oz)              DVT prophylaxis: Place TED hose Start: 02/17/21 1346.  Heparin infusion.   Code Status: Full Code  Family Communication: None  Status is: Inpatient  Remains inpatient appropriate because: Needs better control of A. fib with RVR Inpatient cardiac procedures planned.      Estimated body mass index is 32.83 kg/m as calculated from the following:   Height as of this encounter: 5\' 9"  (1.753 m).   Weight as of this encounter: 100.8 kg.    Nutritional Assessment: Body mass index is 32.83 kg/m.Marland Kitchen Seen by dietician.  I agree with the assessment and plan as outlined below: Nutrition Status:        . Skin Assessment: I have examined the patient's skin and I agree with the wound assessment as performed by the wound care RN as outlined below:    Consultants:  Cardiology  Procedures:  None  Antimicrobials:  Anti-infectives (From admission, onward)    None         Subjective:  Patient seen and examined.  Denies any complaints.  Denies any chest pain or palpitations. Telemetry with a flutter and heart rate mostly 100 and less than 100.  Scheduled for cardiac cath today and aware.  Objective: Vitals:   02/18/21 2338 02/19/21 0418 02/19/21 0722 02/19/21 0812  BP: (!) 83/61 103/82 92/79 109/90  Pulse: 93 78 97 98  Resp: 18 18 19    Temp: 98.1 F (36.7 C) 97.6 F (36.4 C) 97.6 F (36.4 C)   TempSrc: Oral Oral Oral   SpO2: 96% 97% 97%   Weight:  100.8 kg    Height:        Intake/Output Summary (Last 24 hours) at 02/19/2021 1113 Last data filed at 02/19/2021 0947 Gross per 24 hour  Intake 1035.5 ml  Output 4500 ml  Net -3464.5 ml   Filed Weights   02/17/21  0420 02/18/21 0300 02/19/21 0418  Weight: 104.9 kg 103.7 kg 100.8 kg    Examination:  General: Looks comfortable.  On room air. Cardiovascular: S1-S2 normal.  Irregularly irregular.  Tachycardic. Respiratory: Bilateral clear.  No added sounds. Gastrointestinal: Soft.  Nontender.  Bowel sound present. Ext: Trace bilateral pedal edema.  No cyanosis or clubbing. Neuro: Alert oriented x4.  No focal deficits. Musculoskeletal: No deformities.   Data Reviewed: I have personally reviewed following labs and imaging studies  CBC: Recent Labs  Lab 02/13/21 1446 02/14/21 0727 02/18/21 0230 02/19/21 0505  WBC 4.8 4.2 4.3 4.0  HGB 16.3 14.9 15.2 15.0  HCT 50.2 46.5 45.7 46.1  MCV 94.7 94.1 92.9 93.3  PLT 274 247 218 [id number]   Basic Metabolic Panel: Recent Labs  Lab 02/15/21 0400 02/16/21 0330 02/17/21 0430 02/18/21 0230 02/19/21 0505  NA 141 143 139 141 141  K 4.6 5.0 4.1 4.1 3.6  CL 109 107 105 101 104  CO2 26 27 25 28 30   GLUCOSE 105* 100* 104* 112* 98  BUN 23* 21* 20 19 18   CREATININE 1.58* 1.80* 1.56* 1.46* 1.44*  CALCIUM 8.7* 8.6* 8.5* 9.0 8.6*   GFR: Estimated Creatinine Clearance: 69.4 mL/min (A) (by C-G formula based on SCr of 1.44 mg/dL (H)). Liver Function Tests: Recent Labs  Lab 02/13/21 1533  AST 47*  ALT 68*  ALKPHOS 46  BILITOT 1.3*  PROT 6.0*  ALBUMIN 3.7   No results for input(s): LIPASE, AMYLASE in the last 168 hours. No results for input(s): AMMONIA in the last 168 hours. Coagulation Profile: Recent Labs  Lab 02/13/21 1446  INR 1.1   Cardiac Enzymes: No results for input(s): CKTOTAL, CKMB, CKMBINDEX, TROPONINI in the last 168 hours. BNP (last 3 results) No results for input(s): PROBNP in the last 8760 hours. HbA1C: No results for input(s): HGBA1C in the last 72 hours.  CBG: No results for input(s): GLUCAP in the last 168 hours. Lipid Profile: No results for input(s): CHOL, HDL, LDLCALC, TRIG, CHOLHDL, LDLDIRECT in the last 72  hours. Thyroid Function Tests: No results for input(s): TSH, T4TOTAL, FREET4, T3FREE, THYROIDAB in the last 72 hours. Anemia Panel: No results for input(s): VITAMINB12, FOLATE, FERRITIN, TIBC, IRON, RETICCTPCT in the last 72 hours. Sepsis Labs: Recent Labs  Lab 02/17/21 1106  LATICACIDVEN 1.2    Recent Results (from the past 240 hour(s))  Resp Panel by RT-PCR (Flu A&B, Covid) Nasopharyngeal Swab     Status: None   Collection Time: 02/13/21  9:02 PM   Specimen: Nasopharyngeal Swab; Nasopharyngeal(NP) swabs in vial transport medium  Result Value Ref Range Status   SARS Coronavirus 2 by RT PCR NEGATIVE NEGATIVE Final    Comment: (NOTE) SARS-CoV-2 target nucleic acids are NOT DETECTED.  The SARS-CoV-2 RNA is generally detectable in  upper respiratory specimens during the acute phase of infection. The lowest concentration of SARS-CoV-2 viral copies this assay can detect is 138 copies/mL. A negative result does not preclude SARS-Cov-2 infection and should not be used as the sole basis for treatment or other patient management decisions. A negative result may occur with  improper specimen collection/handling, submission of specimen other than nasopharyngeal swab, presence of viral mutation(s) within the areas targeted by this assay, and inadequate number of viral copies(<138 copies/mL). A negative result must be combined with clinical observations, patient history, and epidemiological information. The expected result is Negative.  Fact Sheet for Patients:  EntrepreneurPulse.com.au  Fact Sheet for Healthcare Providers:  IncredibleEmployment.be  This test is no t yet approved or cleared by the [place] FDA and  has been authorized for detection and/or diagnosis of SARS-CoV-2 by FDA under an Emergency Use Authorization (EUA). This EUA will remain  in effect (meaning this test can be used) for the duration of the COVID-19 declaration under  Section 564(b)(1) of the Act, 21 U.S.C.section 360bbb-3(b)(1), unless the authorization is terminated  or revoked sooner.       Influenza A by PCR NEGATIVE NEGATIVE Final   Influenza B by PCR NEGATIVE NEGATIVE Final    Comment: (NOTE) The Xpert Xpress SARS-CoV-2/FLU/RSV plus assay is intended as an aid in the diagnosis of influenza from Nasopharyngeal swab specimens and should not be used as a sole basis for treatment. Nasal washings and aspirates are unacceptable for Xpert Xpress SARS-CoV-2/FLU/RSV testing.  Fact Sheet for Patients: EntrepreneurPulse.com.au  Fact Sheet for Healthcare Providers: IncredibleEmployment.be  This test is not yet approved or cleared by the [place] FDA and has been authorized for detection and/or diagnosis of SARS-CoV-2 by FDA under an Emergency Use Authorization (EUA). This EUA will remain in effect (meaning this test can be used) for the duration of the COVID-19 declaration under Section 564(b)(1) of the Act, 21 U.S.C. section 360bbb-3(b)(1), unless the authorization is terminated or revoked.  Performed at [facility], [place] [address] [place], [place] [address]       Radiology Studies: DG Chest Port 1 View  Result Date: 02/17/2021 CLINICAL DATA:  PICC placement EXAM: PORTABLE CHEST 1 VIEW COMPARISON:  Chest x-ray 02/13/2021 FINDINGS: Right PICC placement with the tip in the region of the SVC. Cardiomegaly and mediastinum appear unchanged. Pulmonary vasculature within normal limits. Mild likely subsegmental atelectasis at the left lung base. No pleural effusion or pneumothorax identified. IMPRESSION: Right PICC placement with the tip in the region of the SVC. No pneumothorax. Electronically Signed   By: Ofilia Neas M.D.   On: 02/17/2021 12:35    Scheduled Meds:  Chlorhexidine Gluconate Cloth  6 each Topical Daily   digoxin  0.25 mg Oral Daily   folic acid  1 mg Oral Daily   furosemide  80 mg  Intravenous BID   multivitamin with minerals  1 tablet Oral Daily   sodium chloride flush  10-40 mL Intracatheter Q12H   sodium chloride flush  3 mL Intravenous Q12H   sodium chloride flush  3 mL Intravenous Q12H   spironolactone  12.5 mg Oral Daily   thiamine  100 mg Oral Daily   Continuous Infusions:  sodium chloride     sodium chloride     [START ON 02/20/2021] sodium chloride     amiodarone 30 mg/hr (02/19/21 0344)   heparin 1,300 Units/hr (02/19/21 0346)     LOS: 6 days   Time spent: 30 minutes  Barb Merino, MD [facility]  02/19/2021, 11:13 AM

## 2021-02-19 NOTE — Interval H&P Note (Signed)
History and Physical Interval Note:  02/19/2021 1:06 PM  Eddie Dixon  has presented today for surgery, with the diagnosis of heart failure.  The various methods of treatment have been discussed with the patient and family. After consideration of risks, benefits and other options for treatment, the patient has consented to  Procedure(s): RIGHT/LEFT HEART CATH AND CORONARY ANGIOGRAPHY (N/A) and possible coronary angioplasty as a surgical intervention.  The patient's history has been reviewed, patient examined, no change in status, stable for surgery.  I have reviewed the patient's chart and labs.  Questions were answered to the patient's satisfaction.     Khori Rosevear

## 2021-02-19 NOTE — Progress Notes (Addendum)
Advanced Heart Failure Rounding Note  PCP-Cardiologist: Quay Burow, MD   Subjective:   Admit weight 236--->222  1/25 Started on amio drip and heparin drip for A fib RVR. LAA thrombus noted. Not a candidate for DC-CV. Diuresing with IV lasix.  1/26 Diuresed with IV lasix. Negative 3.2 liters. Weight down another 6 pounds  Remains in A fib Rates 70-90s. On amio drip + heparin drip.   CO-OX 61%.   Denies SOB. Feels ok.   Objective:   Weight Range: 100.8 kg Body mass index is 32.83 kg/m.   Vital Signs:   Temp:  [97.6 F (36.4 C)-98.4 F (36.9 C)] 97.6 F (36.4 C) (01/27 0722) Pulse Rate:  [78-115] 98 (01/27 0812) Resp:  [14-19] 19 (01/27 0722) BP: (83-115)/(61-90) 109/90 (01/27 0812) SpO2:  [96 %-98 %] 97 % (01/27 0722) Weight:  [100.8 kg] 100.8 kg (01/27 0418) Last BM Date: 02/17/21  Weight change: Filed Weights   02/17/21 0420 02/18/21 0300 02/19/21 0418  Weight: 104.9 kg 103.7 kg 100.8 kg    Intake/Output:   Intake/Output Summary (Last 24 hours) at 02/19/2021 0823 Last data filed at 02/19/2021 0726 Gross per 24 hour  Intake 1035.5 ml  Output 4375 ml  Net -3339.5 ml      Physical Exam   CVP 9-10  General:  Well appearing. No resp difficulty HEENT: normal Neck: supple. no JVD. Carotids 2+ bilat; no bruits. No lymphadenopathy or thryomegaly appreciated. Cor: PMI nondisplaced. Irregular rate & rhythm. No rubs, gallops or murmurs. Lungs: clear Abdomen: soft, nontender, nondistended. No hepatosplenomegaly. No bruits or masses. Good bowel sounds. Extremities: no cyanosis, clubbing, rash, edema. RUE PICC  Neuro: alert & orientedx3, cranial nerves grossly intact. moves all 4 extremities w/o difficulty. Affect pleasant   Telemetry   A fib 70-90s   EKG    N/A  Labs    CBC Recent Labs    02/18/21 0230 02/19/21 0505  WBC 4.3 4.0  HGB 15.2 15.0  HCT 45.7 46.1  MCV 92.9 93.3  PLT 218 [id number]   Basic Metabolic Panel Recent Labs     02/18/21 0230 02/19/21 0505  NA 141 141  K 4.1 3.6  CL 101 104  CO2 28 30  GLUCOSE 112* 98  BUN 19 18  CREATININE 1.46* 1.44*  CALCIUM 9.0 8.6*   Liver Function Tests No results for input(s): AST, ALT, ALKPHOS, BILITOT, PROT, ALBUMIN in the last 72 hours. No results for input(s): LIPASE, AMYLASE in the last 72 hours. Cardiac Enzymes No results for input(s): CKTOTAL, CKMB, CKMBINDEX, TROPONINI in the last 72 hours.  BNP: BNP (last 3 results) Recent Labs    02/13/21 1533  BNP 1,340.6*    ProBNP (last 3 results) No results for input(s): PROBNP in the last 8760 hours.   D-Dimer No results for input(s): DDIMER in the last 72 hours. Hemoglobin A1C No results for input(s): HGBA1C in the last 72 hours.  Fasting Lipid Panel No results for input(s): CHOL, HDL, LDLCALC, TRIG, CHOLHDL, LDLDIRECT in the last 72 hours. Thyroid Function Tests No results for input(s): TSH, T4TOTAL, T3FREE, THYROIDAB in the last 72 hours.  Invalid input(s): FREET3  Other results:   Imaging    No results found.   Medications:     Scheduled Medications:  Chlorhexidine Gluconate Cloth  6 each Topical Daily   digoxin  0.25 mg Oral Daily   folic acid  1 mg Oral Daily   furosemide  80 mg Intravenous BID   multivitamin with  minerals  1 tablet Oral Daily   sodium chloride flush  10-40 mL Intracatheter Q12H   sodium chloride flush  3 mL Intravenous Q12H   sodium chloride flush  3 mL Intravenous Q12H   spironolactone  12.5 mg Oral Daily   thiamine  100 mg Oral Daily    Infusions:  sodium chloride     sodium chloride     [START ON 02/20/2021] sodium chloride     amiodarone 30 mg/hr (02/19/21 0344)   heparin 1,300 Units/hr (02/19/21 0346)    PRN Medications: sodium chloride, sodium chloride, acetaminophen, ondansetron (ZOFRAN) IV, sodium chloride flush, sodium chloride flush, sodium chloride flush, traZODone    Patient Profile   56 y.o. male with history of ETOH abuse and family hx  premature CAD. Admitted with AF with RVR and acute systolic CHF (new).  Assessment/Plan   Acute systolic CHF: -Suspect tachymediated +- ETOH.  -Echo 02/14/21: EF 20-25%, RV moderately reduced, biatrial enlargement, mild to moderate MR, moderate TR -TEE 02/16/21: EF 30-35%, RV mildly reduced, LAA thrombus, possible  -cMRI if no obstructive CAD to explain CM - CO-OX  61%. CVP 9-10. Continue IV lasix and adjust post cath. Supp K  - Continue digoxin 0.25 mg daily -No bb for now. SBP soft.  -No ARB/ARNi for now with low blood pressure earlier today -Continue spiro 12.5 mg daily -Eventual SGLT2i.  -Consult cardiac rehab   2. Atrial fibrillation with RVR: -Newly diagnosed earlier this month. In setting of ETOH use -LAA thrombus noted on TEE, DCCV not currently an option - Remains in A fib but rate better controlled on amio drip. Continue  -On heparin drip  - Discussed importance of compliance with anticoagulants.  - will need outpatient sleep study  - Discussed ETOH cessation.   3. ETOH abuse: - Discussed importance of cessation.  -CIWA per [facility] -Consult HF CSW   4. Prediabetes: -A1c 6.1%   5. AKI: -In setting of acute CHF +/- contrast -Scr peaked at 1.8, 1.44 today (baseline 1.1)   6. Right pleural effusion: -Moderate on CTA 01/21  7. LAA Clot  On heparin drip.  - As above discussed   RHC/LHC today. NPO. The patient understands that risks included but are not limited to stroke (1 in 1000), death (1 in 43), kidney failure [usually temporary] (1 in 500), bleeding (1 in 200), allergic reaction [possibly serious] (1 in 200).  The patient understands and agrees to proceed.     Length of Stay: Ada, NP  02/19/2021, 8:23 AM  Advanced Heart Failure Team Pager [phone] (M-F; 7a - 5p)  Please contact [facility] for night-coverage after hours (5p -7a ) and weekends on [email or web address]  Patient seen and examined with the above-signed Advanced Practice Provider and/or  Housestaff. I personally reviewed laboratory data, imaging studies and relevant notes. I independently examined the patient and formulated the important aspects of the plan. I have edited the note to reflect any of my changes or salient points. I have personally discussed the plan with the patient and/or family.  Feeling better. Remains on heparin and IV amio. AF rates 70-90s. Has diuresed well. CVP 9. Co-ox 61% Denies CP, orthopnea or PND.   General:  Well appearing. No resp difficulty HEENT: normal Neck: supple. JVP 9-10 Carotids 2+ bilat; no bruits. No lymphadenopathy or thryomegaly appreciated. Cor: PMI nondisplaced. Irregular rate & rhythm. No rubs, gallops or murmurs. Lungs: clear Abdomen: soft, nontender, nondistended. No hepatosplenomegaly. No bruits or masses. Good bowel sounds.  Extremities: no cyanosis, clubbing, rash, edema Neuro: alert & orientedx3, cranial nerves grossly intact. moves all 4 extremities w/o difficulty. Affect pleasant  Improving slowly. Has had good diuresis with IV lasix. Can stop IV diuresis. Switch to po diuretics as required.  AF rate controlled on amio. Will plan R/L cath today followed by switch back to Xarelto. 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 If stable over the weekend. Possibly home on Sunday on oral meds.   Glori Bickers, MD  1:09 PM

## 2021-02-19 NOTE — Plan of Care (Signed)
°  Problem: Education: Goal: Knowledge of General Education information will improve Description: Including pain rating scale, medication(s)/side effects and non-pharmacologic comfort measures Outcome: Progressing   Problem: Clinical Measurements: Goal: Ability to maintain clinical measurements within normal limits will improve Outcome: Progressing   Problem: Clinical Measurements: Goal: Cardiovascular complication will be avoided Outcome: Progressing   Problem: Cardiac: Goal: Ability to achieve and maintain adequate cardiopulmonary perfusion will improve Outcome: Progressing   Problem: Cardiac: Goal: Ability to achieve and maintain adequate cardiopulmonary perfusion will improve Outcome: Progressing

## 2021-02-20 DIAGNOSIS — I5021 Acute systolic (congestive) heart failure: Secondary | ICD-10-CM | POA: Diagnosis not present

## 2021-02-20 DIAGNOSIS — I4891 Unspecified atrial fibrillation: Secondary | ICD-10-CM | POA: Diagnosis not present

## 2021-02-20 LAB — CBC
HCT: 45.9 % (ref 39.0–52.0)
Hemoglobin: 15.5 g/dL (ref 13.0–17.0)
MCH: 30.9 pg (ref 26.0–34.0)
MCHC: 33.8 g/dL (ref 30.0–36.0)
MCV: 91.6 fL (ref 80.0–100.0)
Platelets: 188 10*3/uL (ref 150–400)
RBC: 5.01 MIL/uL (ref 4.22–5.81)
RDW: 11.9 % (ref 11.5–15.5)
WBC: 3.5 10*3/uL — ABNORMAL LOW (ref 4.0–10.5)
nRBC: 0 % (ref 0.0–0.2)

## 2021-02-20 LAB — BASIC METABOLIC PANEL
Anion gap: 11 (ref 5–15)
BUN: 15 mg/dL (ref 6–20)
CO2: 28 mmol/L (ref 22–32)
Calcium: 8.3 mg/dL — ABNORMAL LOW (ref 8.9–10.3)
Chloride: 101 mmol/L (ref 98–111)
Creatinine, Ser: 1.4 mg/dL — ABNORMAL HIGH (ref 0.61–1.24)
GFR, Estimated: >60 mL/min (ref >60)
Glucose, Bld: 193 mg/dL — ABNORMAL HIGH (ref 70–99)
Potassium: 3.9 mmol/L (ref 3.5–5.1)
Sodium: 140 mmol/L (ref 135–145)

## 2021-02-20 LAB — COOXEMETRY PANEL
Carboxyhemoglobin: 0.8 % (ref 0.5–1.5)
Methemoglobin: 0.7 % (ref 0.0–1.5)
O2 Saturation: 62.6 %
Total hemoglobin: 15.6 g/dL (ref 12.0–16.0)

## 2021-02-20 MED ORDER — FUROSEMIDE 40 MG PO TABS
60.0000 mg | ORAL_TABLET | Freq: Every day | ORAL | Status: DC
  Administered 2021-02-20 – 2021-02-21 (×2): 60 mg via ORAL
  Filled 2021-02-20 (×2): qty 1

## 2021-02-20 MED ORDER — METOPROLOL SUCCINATE ER 25 MG PO TB24
12.5000 mg | ORAL_TABLET | Freq: Every day | ORAL | Status: DC
  Administered 2021-02-20 – 2021-02-21 (×2): 12.5 mg via ORAL
  Filled 2021-02-20 (×2): qty 1

## 2021-02-20 NOTE — Progress Notes (Signed)
CARDIAC REHAB PHASE I   PRE:  Rate/Rhythm: 125 AF  1100-1130 Patient declines ambulation at this time however has been given permission by floor RN to walk as tolerated in hallway independently. HF booklet provided and reviewed in detail with patient. Stressed importance of medication compliance, low NA diet and daily weights. Discussed RF of CHF exacerbation and AF. Counseled on ETOH abstinence in the setting of HF and AF with RVR. Patient encouraged to ambulate.   Lamiya Naas Minus Breeding RN, BSN

## 2021-02-20 NOTE — Plan of Care (Signed)
  Problem: Health Behavior/Discharge Planning: Goal: Ability to manage health-related needs will improve Outcome: Progressing   Problem: Clinical Measurements: Goal: Ability to maintain clinical measurements within normal limits will improve Outcome: Progressing   Problem: Clinical Measurements: Goal: Will remain free from infection Outcome: Progressing   Problem: Clinical Measurements: Goal: Diagnostic test results will improve Outcome: Progressing   

## 2021-02-20 NOTE — Progress Notes (Signed)
PROGRESS NOTE    Eddie Dixon  [id number] DOB: 1965/12/24 DOA: 02/13/2021 PCP: Cassandria Anger, MD   Brief Narrative:  Eddie Dixon is a 56 y.o. male with medical history significant of EtOH abuse and new diagnosis of A.Fib recently, saw Dr. Gwenlyn Found 1/5, started initially on cardizem and xarelto.  Cardizem made him feel sick so he quit taking this.  Plotnikov started him on metoprolol which he quit taking because he felt it gave him stomach cramps.He also stopped taking Xarelto a couple of days ago due to cost.  Presented to ED with ongoing A.Fib RVR, and peripheral edema in legs worsening over the past couple of days as well as weight gain of 15 to 20 pounds.  Upon arrival to ED, he was in A. fib with RVR with rates around 150.  Soft BPs.  CTA chest negative for PE.  Creatinine up to 1.5.  Admitted to hospitalist and cardiology consulted.  Remains in the hospital with inpatient cardiac procedures and suboptimal heart rate control.  Assessment & Plan:   Principal Problem:   New onset of congestive heart failure (HCC) Active Problems:   Atrial fibrillation with RVR (HCC)   Acute HFrEF (heart failure with reduced ejection fraction) (HCC)   AKI (acute kidney injury) ([place])   ETOH abuse  Atrial fibrillation with RVR: Still with suboptimal heart rate control.  Did not tolerate beta-blockers. Persistent A. fib. Currently remains on amiodarone infusion.  Digoxin added.  Did not tolerate beta-blockers.   Attempted TEE that was aborted due to left atrial appendage clot. Importance of medication compliance including AC reiterated.  Heparin Converted to Xarelto. May need additional heart rate control medications, cardiology following.  Acute systolic congestive heart failure: Transthoracic echo shows 20 to 25% ejection fraction.  On Entresto and Aldactone.  Lasix on hold today.  Clinically responding. Cardiac cath with nonobstructive coronary artery disease. Cardiac MRI with no  evidence of infiltrative disease.  Redemonstration of blood clot. Congestive heart failure is tachycardia mediated.  AKI: Baseline creatinine around 1.1.  Stabilizing at about 1.4-1.5.  Chronic alcoholism: Counseled to quit.  Currently without any withdrawal symptoms.    Prediabetes: Hemoglobin A1c 6.1.  Dietary modification including weight loss recommended.  Obesity: Weight loss counseled and encouraged.  Body mass index is 32.36 kg/m.  Last Weight  Most recent update: 02/20/2021  3:54 AM    Weight  99.4 kg (219 lb 1.6 oz)              DVT prophylaxis: Place TED hose Start: 02/17/21 1346.  Xarelto   Code Status: Full Code  Family Communication: Wife at the bedside Status is: Inpatient  Remains inpatient appropriate because: Needs better control of A. fib with RVR      Estimated body mass index is 32.36 kg/m as calculated from the following:   Height as of this encounter: 5\' 9"  (1.753 m).   Weight as of this encounter: 99.4 kg.       Consultants:  Cardiology  Procedures:  Cardiac cath 1/27 Cardiac MRI 1/27  Antimicrobials:  Anti-infectives (From admission, onward)    None         Subjective:  Patient seen and examined.  No overnight events.  Wife at the bedside.  Eating breakfast.  Denies any chest pain or palpitations. Telemetry with a flutter heart rate 90-120.  Objective: Vitals:   02/20/21 0004 02/20/21 0352 02/20/21 0814 02/20/21 0846  BP: 113/83 110/84 122/87   Pulse: 86   (!) 117  Resp: 19 19    Temp: 97.8 F (36.6 C) 97.9 F (36.6 C) 97.7 F (36.5 C)   TempSrc: Oral Oral Oral   SpO2: 98% 100%    Weight:  99.4 kg    Height:        Intake/Output Summary (Last 24 hours) at 02/20/2021 0952 Last data filed at 02/20/2021 [id number] Gross per 24 hour  Intake 836.29 ml  Output 1325 ml  Net -488.71 ml   Filed Weights   02/18/21 0300 02/19/21 0418 02/20/21 0352  Weight: 103.7 kg 100.8 kg 99.4 kg    Examination:  General: Looks  comfortable.  On room air.  Eating breakfast. Cardiovascular: S1-S2 normal.  Irregularly irregular.  Tachycardic. Respiratory: Bilateral clear.  No added sounds. Gastrointestinal: Soft.  Nontender.  Bowel sound present. Ext: Trace bilateral pedal edema.  No cyanosis or clubbing. Neuro: Alert oriented x4.  No focal deficits. Musculoskeletal: No deformities.   Data Reviewed: I have personally reviewed following labs and imaging studies  CBC: Recent Labs  Lab 02/13/21 1446 02/14/21 0727 02/18/21 0230 02/19/21 0505 02/19/21 1347 02/19/21 1353 02/19/21 1354 02/19/21 1400 02/20/21 0422  WBC 4.8 4.2 4.3 4.0  --   --   --   --  3.5*  HGB 16.3 14.9 15.2 15.0 16.3 16.0 12.9* 15.0 15.5  HCT 50.2 46.5 45.7 46.1 48.0 47.0 38.0* 44.0 45.9  MCV 94.7 94.1 92.9 93.3  --   --   --   --  91.6  PLT 274 247 218 215  --   --   --   --  0000000   Basic Metabolic Panel: Recent Labs  Lab 02/16/21 0330 02/17/21 0430 02/18/21 0230 02/19/21 0505 02/19/21 1347 02/19/21 1353 02/19/21 1354 02/19/21 1400 02/20/21 0422  NA 143 139 141 141 140 142 149* 144 140  K 5.0 4.1 4.1 3.6 4.2 4.0 2.8* 3.6 3.9  CL 107 105 101 104  --   --   --   --  101  CO2 27 25 28 30   --   --   --   --  28  GLUCOSE 100* 104* 112* 98  --   --   --   --  193*  BUN 21* 20 19 18   --   --   --   --  15  CREATININE 1.80* 1.56* 1.46* 1.44*  --   --   --   --  1.40*  CALCIUM 8.6* 8.5* 9.0 8.6*  --   --   --   --  8.3*   GFR: Estimated Creatinine Clearance: 70.9 mL/min (A) (by C-G formula based on SCr of 1.4 mg/dL (H)). Liver Function Tests: Recent Labs  Lab 02/13/21 1533  AST 47*  ALT 68*  ALKPHOS 46  BILITOT 1.3*  PROT 6.0*  ALBUMIN 3.7   No results for input(s): LIPASE, AMYLASE in the last 168 hours. No results for input(s): AMMONIA in the last 168 hours. Coagulation Profile: Recent Labs  Lab 02/13/21 1446  INR 1.1   Cardiac Enzymes: No results for input(s): CKTOTAL, CKMB, CKMBINDEX, TROPONINI in the last 168  hours. BNP (last 3 results) No results for input(s): PROBNP in the last 8760 hours. HbA1C: No results for input(s): HGBA1C in the last 72 hours.  CBG: No results for input(s): GLUCAP in the last 168 hours. Lipid Profile: No results for input(s): CHOL, HDL, LDLCALC, TRIG, CHOLHDL, LDLDIRECT in the last 72 hours. Thyroid Function Tests: No results for input(s): TSH, T4TOTAL, FREET4, T3FREE,  THYROIDAB in the last 72 hours. Anemia Panel: No results for input(s): VITAMINB12, FOLATE, FERRITIN, TIBC, IRON, RETICCTPCT in the last 72 hours. Sepsis Labs: Recent Labs  Lab 02/17/21 1106  LATICACIDVEN 1.2    Recent Results (from the past 240 hour(s))  Resp Panel by RT-PCR (Flu A&B, Covid) Nasopharyngeal Swab     Status: None   Collection Time: 02/13/21  9:02 PM   Specimen: Nasopharyngeal Swab; Nasopharyngeal(NP) swabs in vial transport medium  Result Value Ref Range Status   SARS Coronavirus 2 by RT PCR NEGATIVE NEGATIVE Final    Comment: (NOTE) SARS-CoV-2 target nucleic acids are NOT DETECTED.  The SARS-CoV-2 RNA is generally detectable in upper respiratory specimens during the acute phase of infection. The lowest concentration of SARS-CoV-2 viral copies this assay can detect is 138 copies/mL. A negative result does not preclude SARS-Cov-2 infection and should not be used as the sole basis for treatment or other patient management decisions. A negative result may occur with  improper specimen collection/handling, submission of specimen other than nasopharyngeal swab, presence of viral mutation(s) within the areas targeted by this assay, and inadequate number of viral copies(<138 copies/mL). A negative result must be combined with clinical observations, patient history, and epidemiological information. The expected result is Negative.  Fact Sheet for Patients:  EntrepreneurPulse.com.au  Fact Sheet for Healthcare Providers:   IncredibleEmployment.be  This test is no t yet approved or cleared by the [place] FDA and  has been authorized for detection and/or diagnosis of SARS-CoV-2 by FDA under an Emergency Use Authorization (EUA). This EUA will remain  in effect (meaning this test can be used) for the duration of the COVID-19 declaration under Section 564(b)(1) of the Act, 21 U.S.C.section 360bbb-3(b)(1), unless the authorization is terminated  or revoked sooner.       Influenza A by PCR NEGATIVE NEGATIVE Final   Influenza B by PCR NEGATIVE NEGATIVE Final    Comment: (NOTE) The Xpert Xpress SARS-CoV-2/FLU/RSV plus assay is intended as an aid in the diagnosis of influenza from Nasopharyngeal swab specimens and should not be used as a sole basis for treatment. Nasal washings and aspirates are unacceptable for Xpert Xpress SARS-CoV-2/FLU/RSV testing.  Fact Sheet for Patients: EntrepreneurPulse.com.au  Fact Sheet for Healthcare Providers: IncredibleEmployment.be  This test is not yet approved or cleared by the [place] FDA and has been authorized for detection and/or diagnosis of SARS-CoV-2 by FDA under an Emergency Use Authorization (EUA). This EUA will remain in effect (meaning this test can be used) for the duration of the COVID-19 declaration under Section 564(b)(1) of the Act, 21 U.S.C. section 360bbb-3(b)(1), unless the authorization is terminated or revoked.  Performed at [facility], [place] [address], [place], [place] [address]       Radiology Studies: CARDIAC CATHETERIZATION  Result Date: 02/19/2021   Prox RCA lesion is 20% stenosed.   The left ventricular ejection fraction is less than 25% by visual estimate. Findings: Ao = 96/73 (83) LV = 97/11 RA = 6 RV = 31/7 PA = 30/5 (22) PCW = 17 Fick cardiac output/index = 6.4/3.0 PVR = 0.80 WU SVR = 960 Ao sat = 95% PA sat = 73%, 77% SVC sat = 73% Assessment: 1. Minimal  nonobstructive CAD 2. Severe NICM EF ~20% 3. 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 Will get cMRI. Continue medical therapy. Glori Bickers, MD 2:13 PM  MR CARDIAC MORPHOLOGY W WO CONTRAST  Result Date: 02/19/2021 CLINICAL DATA:  Cardiomyopathy EXAM:  CARDIAC MRI TECHNIQUE: The patient was scanned on a 1.5 Tesla Siemens magnet. A dedicated cardiac coil was used. Functional imaging was done using Fiesta sequences. 2,3, and 4 chamber views were done to assess for RWMA's. Modified Simpson's rule using a short axis stack was used to calculate an ejection fraction on a dedicated work Conservation officer, nature. The patient received 10 cc of Gadavist. After 10 minutes inversion recovery sequences were used to assess for infiltration and scar tissue. CONTRAST:  [id number] FINDINGS: Mild bi atrial enlargement. LAA thrombus present. Possible small PFO Normal ascending thoracic aorta 2.9 cm. Small pericardial effusion mostly posterior to the LV Moderate LVE with diffuse hypokinesis Quantitative EF 21% (EDV 220 cc ESV 174 cc SV 46 cc) No mural apical thrombus or defined RWMAls Delayed enhancement with no significant gadolinium uptake, scar, infarct or infiltrate. Moderate RVE with global hypokinesis RVEF 25% (EDV 201 cc ESV 151 cc SV 50 cc ) T1 1216 msec ECV 28% T2 48 msec IMPRESSION: 1.  Moderate LVE with global hypokinesis EF 21% 2. No significant delayed gadolinium uptake, scar, infarct, infiltration 3.  Normal parametric measures 4.  Moderate RVE with hypokinesis RVEF 25% 5.  LAA thrombus present 6.  Possible PFO 7.  Small pericardial effusion mostly posterior to LV 8.  Mild bi atrial enlargement 9.  Normal valves mild appearing MR 10. 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   On: 02/19/2021 19:30    Scheduled Meds:   Chlorhexidine Gluconate Cloth  6 each Topical Daily   digoxin  0.25 mg Oral Daily   folic acid  1 mg Oral Daily   multivitamin with minerals  1 tablet Oral Daily   rivaroxaban  20 mg Oral Q supper   sodium chloride flush  10-40 mL Intracatheter Q12H   sodium chloride flush  3 mL Intravenous Q12H   sodium chloride flush  3 mL Intravenous Q12H   spironolactone  12.5 mg Oral Daily   thiamine  100 mg Oral Daily   Continuous Infusions:  sodium chloride     sodium chloride     amiodarone 30 mg/hr (02/20/21 0600)     LOS: 7 days   Time spent: 30 minutes  Barb Merino, MD [facility]  02/20/2021, 9:52 AM

## 2021-02-20 NOTE — Progress Notes (Signed)
Progress Note  Patient Name: Cordelro Dromgoole Date of Encounter: 02/20/2021  [facility] Cardiologist: Quay Burow, MD   Subjective   SOB has improved  Inpatient Medications    Scheduled Meds:  Chlorhexidine Gluconate Cloth  6 each Topical Daily   digoxin  0.25 mg Oral Daily   folic acid  1 mg Oral Daily   multivitamin with minerals  1 tablet Oral Daily   rivaroxaban  20 mg Oral Q supper   sodium chloride flush  10-40 mL Intracatheter Q12H   sodium chloride flush  3 mL Intravenous Q12H   sodium chloride flush  3 mL Intravenous Q12H   spironolactone  12.5 mg Oral Daily   thiamine  100 mg Oral Daily   Continuous Infusions:  sodium chloride     sodium chloride     amiodarone 30 mg/hr (02/20/21 0600)   PRN Meds: sodium chloride, sodium chloride, acetaminophen, ondansetron (ZOFRAN) IV, sodium chloride flush, sodium chloride flush, sodium chloride flush, traZODone   Vital Signs    Vitals:   02/20/21 0004 02/20/21 0352 02/20/21 0814 02/20/21 0846  BP: 113/83 110/84 122/87   Pulse: 86   (!) 117  Resp: 19 19    Temp: 97.8 F (36.6 C) 97.9 F (36.6 C) 97.7 F (36.5 C)   TempSrc: Oral Oral Oral   SpO2: 98% 100%    Weight:  99.4 kg    Height:        Intake/Output Summary (Last 24 hours) at 02/20/2021 1155 Last data filed at 02/20/2021 [id number] Gross per 24 hour  Intake 836.29 ml  Output 475 ml  Net 361.29 ml   Last 3 Weights 02/20/2021 02/19/2021 02/18/2021  Weight (lbs) 219 lb 1.6 oz 222 lb 4.8 oz 228 lb 9.9 oz  Weight (kg) 99.383 kg 100.835 kg 103.7 kg      Telemetry    Afib elevated rates - Personally Reviewed  ECG    N/a - Personally Reviewed  Physical Exam   GEN: No acute distress.   Neck: No JVD Cardiac: irreg Respiratory: Clear to auscultation bilaterally. GI: Soft, nontender, non-distended  MS: No edema; No deformity. Neuro:  Nonfocal  Psych: Normal affect   Labs    High Sensitivity Troponin:   Recent Labs  Lab 02/13/21 1533   TROPONINIHS 32*     Chemistry Recent Labs  Lab 02/13/21 1533 02/14/21 0727 02/18/21 0230 02/19/21 0505 02/19/21 1347 02/19/21 1354 02/19/21 1400 02/20/21 0422  NA  --    < > 141 141   < > 149* 144 140  K  --    < > 4.1 3.6   < > 2.8* 3.6 3.9  CL  --    < > 101 104  --   --   --  101  CO2  --    < > 28 30  --   --   --  28  GLUCOSE  --    < > 112* 98  --   --   --  193*  BUN  --    < > 19 18  --   --   --  15  CREATININE  --    < > 1.46* 1.44*  --   --   --  1.40*  CALCIUM  --    < > 9.0 8.6*  --   --   --  8.3*  PROT 6.0*  --   --   --   --   --   --   --  ALBUMIN 3.7  --   --   --   --   --   --   --   AST 47*  --   --   --   --   --   --   --   ALT 68*  --   --   --   --   --   --   --   ALKPHOS 46  --   --   --   --   --   --   --   BILITOT 1.3*  --   --   --   --   --   --   --   GFRNONAA  --    < > 57* 58*  --   --   --  >60  ANIONGAP  --    < > 12 7  --   --   --  11   < > = values in this interval not displayed.    Lipids No results for input(s): CHOL, TRIG, HDL, LABVLDL, LDLCALC, CHOLHDL in the last 168 hours.  Hematology Recent Labs  Lab 02/18/21 0230 02/19/21 0505 02/19/21 1347 02/19/21 1354 02/19/21 1400 02/20/21 0422  WBC 4.3 4.0  --   --   --  3.5*  RBC 4.92 4.94  --   --   --  5.01  HGB 15.2 15.0   < > 12.9* 15.0 15.5  HCT 45.7 46.1   < > 38.0* 44.0 45.9  MCV 92.9 93.3  --   --   --  91.6  MCH 30.9 30.4  --   --   --  30.9  MCHC 33.3 32.5  --   --   --  33.8  RDW 12.1 12.0  --   --   --  11.9  PLT 218 215  --   --   --  188   < > = values in this interval not displayed.   Thyroid No results for input(s): TSH, FREET4 in the last 168 hours.  BNP Recent Labs  Lab 02/13/21 1533  BNP 1,340.6*    DDimer  Recent Labs  Lab 02/13/21 1533  DDIMER 0.97*     Radiology    CARDIAC CATHETERIZATION  Result Date: 02/19/2021   Prox RCA lesion is 20% stenosed.   The left ventricular ejection fraction is less than 25% by visual estimate. Findings: Ao  = 96/73 (83) LV = 97/11 RA = 6 RV = 31/7 PA = 30/5 (22) PCW = 17 Fick cardiac output/index = 6.4/3.0 PVR = 0.80 WU SVR = 960 Ao sat = 95% PA sat = 73%, 77% SVC sat = 73% Assessment: 1. Minimal nonobstructive CAD 2. Severe NICM EF ~20% 3. 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 Will get cMRI. Continue medical therapy. Glori Bickers, MD 2:13 PM  MR CARDIAC MORPHOLOGY W WO CONTRAST  Result Date: 02/19/2021 CLINICAL DATA:  Cardiomyopathy EXAM: CARDIAC MRI TECHNIQUE: The patient was scanned on a 1.5 Tesla Siemens magnet. A dedicated cardiac coil was used. Functional imaging was done using Fiesta sequences. 2,3, and 4 chamber views were done to assess for RWMA's. Modified Simpson's rule using a short axis stack was used to calculate an ejection fraction on a dedicated work Conservation officer, nature. The patient received 10 cc of Gadavist. After 10 minutes inversion recovery sequences were used to assess for infiltration and scar tissue. CONTRAST:  [id number] FINDINGS: Mild bi atrial enlargement.  LAA thrombus present. Possible small PFO Normal ascending thoracic aorta 2.9 cm. Small pericardial effusion mostly posterior to the LV Moderate LVE with diffuse hypokinesis Quantitative EF 21% (EDV 220 cc ESV 174 cc SV 46 cc) No mural apical thrombus or defined RWMAls Delayed enhancement with no significant gadolinium uptake, scar, infarct or infiltrate. Moderate RVE with global hypokinesis RVEF 25% (EDV 201 cc ESV 151 cc SV 50 cc ) T1 1216 msec ECV 28% T2 48 msec IMPRESSION: 1.  Moderate LVE with global hypokinesis EF 21% 2. No significant delayed gadolinium uptake, scar, infarct, infiltration 3.  Normal parametric measures 4.  Moderate RVE with hypokinesis RVEF 25% 5.  LAA thrombus present 6.  Possible PFO 7.  Small pericardial effusion mostly posterior to LV 8.  Mild bi atrial enlargement 9.  Normal valves mild appearing MR 10. After chart review findings most  consistent with non ischemic DCM secondary to rapid atrial flutter and ETOH abuse [place] Electronically Signed   By: Jenkins Rouge M.D.   On: 02/19/2021 19:30    Cardiac Studies   Echo 02/14/21   1. Left ventricular ejection fraction, by estimation, is 20 to 25%. The  left ventricle has severely decreased function. The left ventricle  demonstrates global hypokinesis. There is mild left ventricular  hypertrophy. Left ventricular diastolic parameters   are indeterminate.   2. Right ventricular systolic function is moderately reduced. The right  ventricular size is mildly enlarged. There is normal pulmonary artery  systolic pressure. The estimated right ventricular systolic pressure is  [id number] mmHg.   3. Left atrial size was moderately dilated.   4. Right atrial size was severely dilated.   5. The mitral valve is normal in structure. Mild to moderate mitral valve  regurgitation. No evidence of mitral stenosis.   6. Tricuspid valve regurgitation is moderate.   7. The aortic valve was not well visualized. Aortic valve regurgitation  is not visualized. No aortic stenosis is present.   8. The inferior vena cava is dilated in size with >50% respiratory  variability, suggesting right atrial pressure of 8 mmHg.    TEE 02/16/21  1. Left ventricular ejection fraction, by estimation, is 30 to 35%. The  left ventricle has moderately decreased function. The left ventricle  demonstrates global hypokinesis. The left ventricular internal cavity size  was mildly dilated.   2. Right ventricular systolic function is mildly reduced. The right  ventricular size is normal.   3. Left atrial size was appears dilated visually. A left atrial/left  atrial appendage thrombus was detected. The LAA emptying velocity was 22  cm/s.   4. Right atrial size was mildly dilated.   5. The mitral valve is grossly normal. Mild mitral valve regurgitation.  No evidence of mitral stenosis.   6. Tricuspid valve  regurgitation is mild to moderate.   7. The aortic valve is tricuspid. Aortic valve regurgitation is not  visualized. No aortic stenosis is present.   8. Cannot exclude a small PFO. Agitated saline contrast bubble study was  positive with shunting observed within 3-6 cardiac cycles suggestive of  interatrial shunt. There is a patent foramen ovale with predominantly  right to left shunting across the  atrial septum.     Patient Profile     56 y.o. male with a PMH of ongoing alcohol abuse, newly diagnosed afib, anxiety, tobacco use disorder who was seen by cardiology on  1/21 for evaluation of afib with rvr now with severe systolic dysfunction , LAA thrombus,  and low output state  Assessment & Plan    Acute systolic HF - Jan [id number] echo LVEF 20-25%, global hypokinesis, indet diastolic, mod RV dysfunction - Jan 2023 RHC/LHC: 20% RCA lesion, otherwise normal coronaries - Mean PA 22, PCWP 17, CI 3 - cMRI: LVEF 21%, no signifcant delayed gad uptake. LAA thrombus - from notes suspected EtOH CM vs tachy mediated given new dx of afib  -COOX today 63%. CVP 5.  - negative 2L yesterday, neg 5.1 L since admission. Had been on IV lasix 80mg  bid. Cr up to 1.8 on Jan 24, but trendeing back down and stable at 1.4.   - medical therapy limited by low bp's though SBPs over last 24-48 hrs look improved - he is on digoxin 0.25mg , aldactone 12.5mg  daily. With improved SBPs start toprol 12.5mg  daily. Start oral lasix 60mg  daily.    2. Afib/LA appendage clot - new diagnosis this admission, issues with afib with RVR - management complicated by low bp's, was started on amiodarone - TEE showed clot in LA appendage, not able to cardiovert - he is on xarelto started Jan 22  - rates remain elevated. WIth improved SBPs start toprol 12.5mg  daily. Continue amio gtt, likely try to change to oral tomorrow. Continue digoxin.     For questions or updates, please contact [place] Please consult [email or web address]  for contact info under        Signed, Carlyle Dolly, MD  02/20/2021, 11:55 AM

## 2021-02-21 ENCOUNTER — Inpatient Hospital Stay (HOSPITAL_COMMUNITY): Payer: Managed Care, Other (non HMO)

## 2021-02-21 DIAGNOSIS — I509 Heart failure, unspecified: Secondary | ICD-10-CM | POA: Diagnosis not present

## 2021-02-21 DIAGNOSIS — I639 Cerebral infarction, unspecified: Secondary | ICD-10-CM

## 2021-02-21 DIAGNOSIS — I6330 Cerebral infarction due to thrombosis of unspecified cerebral artery: Secondary | ICD-10-CM | POA: Insufficient documentation

## 2021-02-21 DIAGNOSIS — I4891 Unspecified atrial fibrillation: Secondary | ICD-10-CM | POA: Diagnosis not present

## 2021-02-21 LAB — LIPID PANEL
Cholesterol: 152 mg/dL (ref 0–200)
HDL: 47 mg/dL (ref >40)
LDL Cholesterol: 93 mg/dL (ref 0–99)
Total CHOL/HDL Ratio: 3.2 RATIO
Triglycerides: 62 mg/dL (ref <150)
VLDL: 12 mg/dL (ref 0–40)

## 2021-02-21 LAB — BASIC METABOLIC PANEL
Anion gap: 9 (ref 5–15)
BUN: 13 mg/dL (ref 6–20)
CO2: 27 mmol/L (ref 22–32)
Calcium: 8.2 mg/dL — ABNORMAL LOW (ref 8.9–10.3)
Chloride: 100 mmol/L (ref 98–111)
Creatinine, Ser: 1.37 mg/dL — ABNORMAL HIGH (ref 0.61–1.24)
GFR, Estimated: >60 mL/min (ref >60)
Glucose, Bld: 111 mg/dL — ABNORMAL HIGH (ref 70–99)
Potassium: 3.5 mmol/L (ref 3.5–5.1)
Sodium: 136 mmol/L (ref 135–145)

## 2021-02-21 LAB — CBC
HCT: 44.1 % (ref 39.0–52.0)
Hemoglobin: 14.9 g/dL (ref 13.0–17.0)
MCH: 30.8 pg (ref 26.0–34.0)
MCHC: 33.8 g/dL (ref 30.0–36.0)
MCV: 91.3 fL (ref 80.0–100.0)
Platelets: 184 10*3/uL (ref 150–400)
RBC: 4.83 MIL/uL (ref 4.22–5.81)
RDW: 11.7 % (ref 11.5–15.5)
WBC: 3.6 10*3/uL — ABNORMAL LOW (ref 4.0–10.5)
nRBC: 0 % (ref 0.0–0.2)

## 2021-02-21 LAB — GLUCOSE, CAPILLARY: Glucose-Capillary: 95 mg/dL (ref 70–99)

## 2021-02-21 LAB — HEMOGLOBIN A1C
Hgb A1c MFr Bld: 5.6 % (ref 4.8–5.6)
Mean Plasma Glucose: 114.02 mg/dL

## 2021-02-21 MED ORDER — SODIUM CHLORIDE 0.9 % IV SOLN: INTRAVENOUS | Status: DC

## 2021-02-21 MED ORDER — ATORVASTATIN CALCIUM 40 MG PO TABS
40.0000 mg | ORAL_TABLET | Freq: Every day | ORAL | Status: DC
  Administered 2021-02-21 – 2021-02-23 (×3): 40 mg via ORAL
  Filled 2021-02-21 (×3): qty 1

## 2021-02-21 MED ORDER — METOPROLOL TARTRATE 25 MG PO TABS
25.0000 mg | ORAL_TABLET | Freq: Three times a day (TID) | ORAL | Status: DC
  Administered 2021-02-21 (×2): 25 mg via ORAL
  Filled 2021-02-21 (×2): qty 1

## 2021-02-21 MED ORDER — AMIODARONE HCL 200 MG PO TABS
400.0000 mg | ORAL_TABLET | Freq: Two times a day (BID) | ORAL | Status: DC
  Administered 2021-02-21 – 2021-02-23 (×5): 400 mg via ORAL
  Filled 2021-02-21 (×5): qty 2

## 2021-02-21 MED ORDER — STROKE: EARLY STAGES OF RECOVERY BOOK
Freq: Once | Status: AC
  Filled 2021-02-21: qty 1

## 2021-02-21 MED ORDER — IOHEXOL 350 MG/ML SOLN
75.0000 mL | Freq: Once | INTRAVENOUS | Status: AC | PRN
  Administered 2021-02-21: 75 mL via INTRAVENOUS

## 2021-02-21 MED ORDER — APIXABAN 5 MG PO TABS
5.0000 mg | ORAL_TABLET | Freq: Two times a day (BID) | ORAL | Status: DC
  Administered 2021-02-21 – 2021-02-23 (×4): 5 mg via ORAL
  Filled 2021-02-21 (×4): qty 1

## 2021-02-21 NOTE — Evaluation (Signed)
Physical Therapy Evaluation Patient Details Name: Nore Kanode MRN: [id number] DOB: 1965/08/17 Today's Date: 02/21/2021  History of Present Illness  Pt is a 56 y/o male admitted secondary to a-fib with RVR now with severe systolic dysfunction. Of note, pt also having R-sided weakness on the morning of 02/21/21 while admitted. Code stroke was activated. Neurology was consulted, awaiting MRI results. Symptoms resolved at the time of eval. PMH including but not limited to ETOH abuse (ongoing), newly diagnosed afib, anxiety, tobacco use disorder, CHF.   Clinical Impression  Pt presented supine in bed with HOB elevated, awake and willing to participate in therapy session. Prior to admission, pt reported that he was independent with all functional mobility and ADLs. Pt lives with his girlfriend in a single level home with two flights of stairs to enter. At the time of evaluation, pt's R-sided weakness had completely resolved with no sensory or motor deficits (MMT 5/5 throughout all extremities). However, he is still awaiting an MRI and further work-up at the time of eval. Pt reported that he feels he is back to his normal self in regards to functional mobility. PT will continue to f/u with pt acutely to progress mobility as tolerated to ensure a safe d/c home when medically appropriate.        Recommendations for follow up therapy are one component of a multi-disciplinary discharge planning process, led by the attending physician.  Recommendations may be updated based on patient status, additional functional criteria and insurance authorization.  Follow Up Recommendations No PT follow up    Assistance Recommended at Discharge Set up Supervision/Assistance  Patient can return home with the following       Equipment Recommendations None recommended by PT  Recommendations for Other Services       Functional Status Assessment Patient has had a recent decline in their functional status and  demonstrates the ability to make significant improvements in function in a reasonable and predictable amount of time.     Precautions / Restrictions Precautions Precautions: Fall Restrictions Weight Bearing Restrictions: No      Mobility  Bed Mobility Overal bed mobility: Independent                  Transfers Overall transfer level: Independent                      Ambulation/Gait Ambulation/Gait assistance: Supervision Gait Distance (Feet): 200 Feet Assistive device: IV Pole Gait Pattern/deviations: Step-through pattern Gait velocity: decreased     General Gait Details: pt with cautious but steady gait, pushing IV pole but not dependent on it for support or stability; supervision for safety  Stairs            Wheelchair Mobility    Modified Rankin (Stroke Patients Only) Modified Rankin (Stroke Patients Only) Pre-Morbid Rankin Score: No symptoms Modified Rankin: No symptoms     Balance Overall balance assessment: Needs assistance Sitting-balance support: Feet supported Sitting balance-Leahy Scale: Normal     Standing balance support: During functional activity, No upper extremity supported Standing balance-Leahy Scale: Good                               Pertinent Vitals/Pain Pain Assessment Pain Assessment: No/denies pain    Home Living Family/patient expects to be discharged to:: Private residence Living Arrangements: Spouse/significant other Available Help at Discharge: Family;Available 24 hours/day   Home Access: Stairs to enter Entrance Stairs-Rails:  Left;Right Entrance Stairs-Number of Steps: 2 flights   Home Layout: One level Home Equipment: None      Prior Function Prior Level of Function : Independent/Modified Independent                     Hand Dominance   Dominant Hand: Right    Extremity/Trunk Assessment   Upper Extremity Assessment Upper Extremity Assessment: Overall WFL for tasks  assessed    Lower Extremity Assessment Lower Extremity Assessment: Overall WFL for tasks assessed    Cervical / Trunk Assessment Cervical / Trunk Assessment: Normal  Communication   Communication: No difficulties  Cognition Arousal/Alertness: Awake/alert Behavior During Therapy: WFL for tasks assessed/performed Overall Cognitive Status: Within Functional Limits for tasks assessed                                          General Comments      Exercises     Assessment/Plan    PT Assessment Patient needs continued PT services  PT Problem List Decreased mobility       PT Treatment Interventions Gait training;Stair training;Therapeutic exercise;Therapeutic activities;Neuromuscular re-education;Patient/family education    PT Goals (Current goals can be found in the Care Plan section)  Acute Rehab PT Goals Patient Stated Goal: "home soon" PT Goal Formulation: With patient Time For Goal Achievement: 03/07/21 Potential to Achieve Goals: Good    Frequency Min 3X/week     Co-evaluation               AM-PAC PT "6 Clicks" Mobility  Outcome Measure Help needed turning from your back to your side while in a flat bed without using bedrails?: None Help needed moving from lying on your back to sitting on the side of a flat bed without using bedrails?: None Help needed moving to and from a bed to a chair (including a wheelchair)?: None Help needed standing up from a chair using your arms (e.g., wheelchair or bedside chair)?: None Help needed to walk in hospital room?: None Help needed climbing 3-5 steps with a railing? : A Little 6 Click Score: 23    End of Session   Activity Tolerance: Patient tolerated treatment well Patient left: in bed;with call bell/phone within reach Nurse Communication: Mobility status PT Visit Diagnosis: Other abnormalities of gait and mobility (R26.89)    Time: [id number] PT Time Calculation (min) (ACUTE ONLY): 12  min   Charges:   PT Evaluation $PT Eval Moderate Complexity: 1 Mod          Eduard Clos, PT, DPT  Acute Rehabilitation Services Office [place] 02/21/2021, 9:57 AM

## 2021-02-21 NOTE — Evaluation (Signed)
Occupational Therapy Evaluation Patient Details Name: Eddie Dixon MRN: [id number] DOB: Jul 22, 1965 Today's Date: 02/21/2021   History of Present Illness Pt is a 56 y/o male admitted secondary to a-fib with RVR now with severe systolic dysfunction. Of note, pt also having R-sided weakness on the morning of 02/21/21 while admitted. Code stroke was activated. MRI (+) Small acute infarcts in the high left parietal cortex and left  centrum semiovale.  PMH including but not limited to ETOH abuse (ongoing), newly diagnosed afib, anxiety, tobacco use disorder, CHF.   Clinical Impression   Eddie Dixon was indep PTA including working full time at a physically demanding job. Upon evaluation pt demonstrated independence with functional mobility and ADLs. Speech was fluent and clear, denies RUE paresthesias, strength and ROM was [facility]. Stroke symptoms have resolved. Reviewed BE FAST stroke education, pt verbalized great understanding. Pt does not have further acute OT needs acutely, or after d/c.     Recommendations for follow up therapy are one component of a multi-disciplinary discharge planning process, led by the attending physician.  Recommendations may be updated based on patient status, additional functional criteria and insurance authorization.   Follow Up Recommendations  No OT follow up    Assistance Recommended at Discharge PRN     Functional Status Assessment  Patient has had a recent decline in their functional status and demonstrates the ability to make significant improvements in function in a reasonable and predictable amount of time.  Equipment Recommendations  None recommended by OT       Precautions / Restrictions Precautions Precautions: Fall Restrictions Weight Bearing Restrictions: No      Mobility Bed Mobility Overal bed mobility: Independent                  Transfers Overall transfer level: Independent              Balance Overall balance assessment:  Independent, No apparent balance deficits (not formally assessed)           ADL either performed or assessed with clinical judgement   ADL Overall ADL's : Independent       General ADL Comments: pt is completing ADLs independently, no AD. Good safety awareness.     Vision Baseline Vision/History: 0 No visual deficits Vision Assessment?: No apparent visual deficits      Pertinent Vitals/Pain Pain Assessment Pain Assessment: No/denies pain     Hand Dominance Right   Extremity/Trunk Assessment Upper Extremity Assessment Upper Extremity Assessment: Overall WFL for tasks assessed   Lower Extremity Assessment Lower Extremity Assessment: Overall WFL for tasks assessed   Cervical / Trunk Assessment Cervical / Trunk Assessment: Normal   Communication Communication Communication: No difficulties   Cognition Arousal/Alertness: Awake/alert Behavior During Therapy: WFL for tasks assessed/performed Overall Cognitive Status: Within Functional Limits for tasks assessed     General Comments: educated on BE FAST, pt verbalized great understanding     General Comments  VSS on RA.            Home Living Family/patient expects to be discharged to:: Private residence Living Arrangements: Spouse/significant other Available Help at Discharge: Family;Available 24 hours/day Type of Home: House Home Access: Stairs to enter [employer] of Steps: 2 flights Entrance Stairs-Rails: Left;Right Home Layout: One level     Bathroom Shower/Tub: Tub/shower unit         Home Equipment: None          Prior Functioning/Environment Prior Level of Function : Independent/Modified Independent;Working/employed;Driving  Mobility Comments: walks a lot a work ADLs Comments: works with Adult nurse?        OT Problem List: Decreased safety awareness;Decreased knowledge of precautions      OT Treatment/Interventions:      OT Goals(Current goals can be  found in the care plan section) Acute Rehab OT Goals Patient Stated Goal: home asap OT Goal Formulation: All assessment and education complete, DC therapy Time For Goal Achievement: 02/21/21         AM-PAC OT "6 Clicks" Daily Activity     Outcome Measure Help from another person eating meals?: None Help from another person taking care of personal grooming?: None Help from another person toileting, which includes using toliet, bedpan, or urinal?: None Help from another person bathing (including washing, rinsing, drying)?: None Help from another person to put on and taking off regular upper body clothing?: None Help from another person to put on and taking off regular lower body clothing?: None 6 Click Score: 24   End of Session Nurse Communication: Mobility status  Activity Tolerance: Patient tolerated treatment well Patient left: in bed;with call bell/phone within reach  OT Visit Diagnosis: Hemiplegia and hemiparesis Hemiplegia - Right/Left: Right Hemiplegia - dominant/non-dominant: Dominant Hemiplegia - caused by: Cerebral infarction (resolved.)                [id number] OT Time Calculation (min): 12 min Charges:  OT General Charges $OT Visit: 1 Visit OT Evaluation $OT Eval Low Complexity: 1 Low  Reola Buckles A Milam Allbaugh 02/21/2021, 4:06 PM

## 2021-02-21 NOTE — Consult Note (Signed)
Neurology Consultation Reason for Consult: Right sided numbness Referring Physician: Sloan Leiter, K  CC: Right sided weakness  History is obtained from:patient  HPI: Natanael Berretta is a 56 y.o. male with a history of CHF, afib, on xarelto admitted with RVR, AKI, LE edema. He had been started on xarelto shortly before this , but had not been taking it due to cost.  He was started on heparin when he represented on 1/21 and then transition to [place] on 1/22.  TEE has revealed left atrial appendage clot, so he was not cardioverted.  He has been difficult to control, managed on digoxin and then changed to amiodarone.  Tonight, he was in his normal state of health when the nurse went in to change his bag of amiodarone at 1230.  Subsequently, he was discovered to have right-sided weakness.  Code stroke was activated.    LKW: 12:30 am  tpa given?: no, anticoagulated.  NIHSS: 9   ROS: A 14 point ROS was performed and is negative except as noted in the HPI.   Past Medical History:  Diagnosis Date   A-fib [facility]    Anxiety    ED (erectile dysfunction)    Elevated blood pressure      Family History  Problem Relation Age of Onset   Heart disease Father 57       MI     Social History:  reports that he has never smoked. His smokeless tobacco use includes chew. He reports current alcohol use of about 17.0 standard drinks per week. He reports that he does not use drugs.   Exam: Current vital signs: BP (!) 133/104 (BP Location: Left Arm)    Pulse 100    Temp 98.9 F (37.2 C) (Oral)    Resp 20    Ht 5\' 9"  (1.753 m)    Wt 99.4 kg    SpO2 98%    BMI 32.36 kg/m  Vital signs in last 24 hours: Temp:  [97.7 F (36.5 C)-98.9 F (37.2 C)] 98.9 F (37.2 C) (01/28 2351) Pulse Rate:  [78-119] 100 (01/29 0126) Resp:  [19-20] 20 (01/29 0126) BP: (98-139)/(63-109) 133/104 (01/29 0126) SpO2:  [94 %-100 %] 98 % (01/28 2351) Weight:  [99.4 kg] 99.4 kg (01/28 0352)   Physical Exam  Constitutional:  Appears well-developed and well-nourished.    Neuro: Mental Status: Patient is awake, alert, oriented to person, place, month, year, and situation. Patient is able to give a clear and coherent history. No signs of aphasia or neglect Cranial Nerves: II: Visual Fields are full. Pupils are equal, round, and reactive to light.   III,IV, VI: EOMI without ptosis or diploplia.  V: Facial sensation is symmetric to temperature VII: Facial movement is symmetric when smiling, but he has a very mildly decreased nasolabial fold on the right VIII: hearing is intact to voice X: Uvula elevates symmetrically XI: Shoulder shrug is symmetric. XII: tongue is midline without atrophy or fasciculations.  Motor: Tone is decreased in the right arm. Bulk is normal.  He has 2/5 strength in his right arm and 0/5 in the right leg Sensory: Sensation is diminished throughout the right side Cerebellar: No ataxia on finger-nose-finger on the left  I have reviewed labs in epic and the results pertinent to this consultation are: Creatinine 1.4  I have reviewed the images obtained: CTA head neck-negative  Impression: 56 year old male with left atrial thrombus on anticoagulation with xarelto with right-sided weakness.  I strongly suspect that he embolized some or all  of the thrombus.  He is not a tenecteplase candidate due to anticoagulation.  With no LVO, no acute intervention is available, but luckily he is having some improvement.  Though I typically recommend holding anticoagulation in the setting of acute stroke, with intracardiac thrombus, there may be slightly higher risk.  I would favor getting an MRI and deciding resumption timing based on that.  His last dose of Xarelto was 6pm and therefore he would not be due to start anything until that time tomorrow.   Recommendations: - HgbA1c, fasting lipid panel - MRI of the brain without contrast - Frequent neuro checks - Echocardiogram - Prophylactic therapy-May  consider restarting heparin when Xarelto would be due depending on MRI results - Risk factor modification - Telemetry monitoring - PT consult, OT consult, Speech consult - Stroke team to follow   Roland Rack, MD [facility] [phone]  If 7pm- 7am, please page neurology on call as listed in [place].

## 2021-02-21 NOTE — Code Documentation (Signed)
Stroke Response Nurse Documentation Code Documentation  Eddie Dixon is a 56 y.o. male admitted to [facility] on 1/21 with past medical hx of afib,CAD, HTN, CHF.  On Xarelto (rivaroxaban) daily. Code stroke was activated by [place].  Patient from 3E20 where he was LKW at [place] and now complaining of Right sided numbness and weakness.   Stroke team at the bedside. Labs drawn and patient cleared for CT by Dr. Leonel Ramsay. Patient to CT with team. NIHSS 9, see documentation for details and code stroke times. Patient with right arm weakness, right leg weakness, right decreased sensation, and dysarthria  on exam. The following imaging was completed:  CT, CTA head and neck. Patient is not a candidate for IV Thrombolytic due to anticoagulation. Patient is not a candidate for IR due to No LVO.     Bedside handoff with ED RN Ronalee Belts.    Madelynn Done  Rapid Response RN

## 2021-02-21 NOTE — Progress Notes (Signed)
Pt's call light was rung and the RN went to assess the pt.  The pt was found trying to get out of bed to go to the bathroom, but pt could not move his right side and was slurring his words.  RR RN was called and a code stroke was intiated.  Neuro checks were performed by RR RN.  Pt was not able to lift his R leg or R arm and pt was still slurring his words and c/o having a hard time talking and getting words out.  Pt's CBG was 95, and his BP was 139/109, 133/104.  Shortly after this pt was brought down to CT for a stat CT angio, to determine were and how significant the thrombus was.  Earlier this week pt was found to have a L atrium thrombus by TEE, and of course pt was on Heparin and just started back on his Xarelto to help with this today.  Attending MD was notified of episode.  Neuro MD ordered 500 bolus and 100 ml/hr of NS, will continue to monitor, Thanks Arvella Nigh RN.

## 2021-02-21 NOTE — Progress Notes (Signed)
Progress Note  Patient Name: Eddie Dixon Date of Encounter: 02/21/2021  [facility] Cardiologist: Quay Burow, MD   Subjective   Acute right sided weakness early AM, managed as code stroke. Weakness has resolved  Inpatient Medications    Scheduled Meds:   stroke: mapping our early stages of recovery book   Does not apply Once   Chlorhexidine Gluconate Cloth  6 each Topical Daily   digoxin  0.25 mg Oral Daily   folic acid  1 mg Oral Daily   furosemide  60 mg Oral Daily   metoprolol succinate  12.5 mg Oral Daily   multivitamin with minerals  1 tablet Oral Daily   sodium chloride flush  10-40 mL Intracatheter Q12H   sodium chloride flush  3 mL Intravenous Q12H   sodium chloride flush  3 mL Intravenous Q12H   spironolactone  12.5 mg Oral Daily   thiamine  100 mg Oral Daily   Continuous Infusions:  sodium chloride     sodium chloride     sodium chloride 100 mL/hr at 02/21/21 0842   amiodarone 30 mg/hr (02/21/21 0300)   PRN Meds: sodium chloride, sodium chloride, acetaminophen, ondansetron (ZOFRAN) IV, sodium chloride flush, sodium chloride flush, sodium chloride flush, traZODone   Vital Signs    Vitals:   02/21/21 0542 02/21/21 0711 02/21/21 0800 02/21/21 1011  BP: 100/74 (!) 120/96 (!) 117/105   Pulse: 82 85 90 100  Resp: 18 18 16    Temp:  98.2 F (36.8 C) 98.3 F (36.8 C)   TempSrc:  Oral Oral   SpO2: 98% 99% 96%   Weight:      Height:        Intake/Output Summary (Last 24 hours) at 02/21/2021 1149 Last data filed at 02/21/2021 1148 Gross per 24 hour  Intake 1088.6 ml  Output 2050 ml  Net -961.4 ml   Last 3 Weights 02/20/2021 02/19/2021 02/18/2021  Weight (lbs) 219 lb 1.6 oz 222 lb 4.8 oz 228 lb 9.9 oz  Weight (kg) 99.383 kg 100.835 kg 103.7 kg      Telemetry    Afib variable rates - Personally Reviewed  ECG    N/a - Personally Reviewed  Physical Exam   GEN: No acute distress.   Neck: No JVD Cardiac: irreg Respiratory: Clear to  auscultation bilaterally. GI: Soft, nontender, non-distended  MS: No edema; No deformity. Neuro:  Nonfocal  Psych: Normal affect   Labs    High Sensitivity Troponin:   Recent Labs  Lab 02/13/21 1533  TROPONINIHS 32*     Chemistry Recent Labs  Lab 02/19/21 0505 02/19/21 1347 02/19/21 1400 02/20/21 0422 02/21/21 0248  NA 141   < > 144 140 136  K 3.6   < > 3.6 3.9 3.5  CL 104  --   --  101 100  CO2 30  --   --  28 27  GLUCOSE 98  --   --  193* 111*  BUN 18  --   --  15 13  CREATININE 1.44*  --   --  1.40* 1.37*  CALCIUM 8.6*  --   --  8.3* 8.2*  GFRNONAA 58*  --   --  >60 >60  ANIONGAP 7  --   --  11 9   < > = values in this interval not displayed.    Lipids  Recent Labs  Lab 02/21/21 0337  CHOL 152  TRIG 62  HDL 47  LDLCALC 93  CHOLHDL 3.2  Hematology Recent Labs  Lab 02/19/21 0505 02/19/21 1347 02/19/21 1400 02/20/21 0422 02/21/21 0248  WBC 4.0  --   --  3.5* 3.6*  RBC 4.94  --   --  5.01 4.83  HGB 15.0   < > 15.0 15.5 14.9  HCT 46.1   < > 44.0 45.9 44.1  MCV 93.3  --   --  91.6 91.3  MCH 30.4  --   --  30.9 30.8  MCHC 32.5  --   --  33.8 33.8  RDW 12.0  --   --  11.9 11.7  PLT 215  --   --  188 184   < > = values in this interval not displayed.   Thyroid No results for input(s): TSH, FREET4 in the last 168 hours.  BNPNo results for input(s): BNP, PROBNP in the last 168 hours.  DDimer No results for input(s): DDIMER in the last 168 hours.   Radiology    MR BRAIN WO CONTRAST  Result Date: 02/21/2021 CLINICAL DATA:  Stroke follow-up EXAM: MRI HEAD WITHOUT CONTRAST TECHNIQUE: Multiplanar, multiecho pulse sequences of the brain and surrounding structures were obtained without intravenous contrast. COMPARISON:  Head CT and CTA from yesterday FINDINGS: Brain: Tiny acute to subacute infarcts in the left anterior parietal cortex and centrum semiovale. Subacute timing is considered given the fairly weakly restricted diffusion, although history suggests  acute timing. No hemorrhage, hydrocephalus, mass, or collection. Brain volume is normal. No visible prior infarction. Vascular: Normal flow voids. Skull and upper cervical spine: Normal marrow signal. Sinuses/Orbits: Negative. IMPRESSION: Small acute infarcts in the high left parietal cortex and left centrum semiovale. Electronically Signed   By: Jorje Guild M.D.   On: 02/21/2021 11:41   CARDIAC CATHETERIZATION  Result Date: 02/19/2021   Prox RCA lesion is 20% stenosed.   The left ventricular ejection fraction is less than 25% by visual estimate. Findings: Ao = 96/73 (83) LV = 97/11 RA = 6 RV = 31/7 PA = 30/5 (22) PCW = 17 Fick cardiac output/index = 6.4/3.0 PVR = 0.80 WU SVR = 960 Ao sat = 95% PA sat = 73%, 77% SVC sat = 73% Assessment: 1. Minimal nonobstructive CAD 2. Severe NICM EF ~20% 3. 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 Will get cMRI. Continue medical therapy. Glori Bickers, MD 2:13 PM  MR CARDIAC MORPHOLOGY W WO CONTRAST  Result Date: 02/19/2021 CLINICAL DATA:  Cardiomyopathy EXAM: CARDIAC MRI TECHNIQUE: The patient was scanned on a 1.5 Tesla Siemens magnet. A dedicated cardiac coil was used. Functional imaging was done using Fiesta sequences. 2,3, and 4 chamber views were done to assess for RWMA's. Modified Simpson's rule using a short axis stack was used to calculate an ejection fraction on a dedicated work Conservation officer, nature. The patient received 10 cc of Gadavist. After 10 minutes inversion recovery sequences were used to assess for infiltration and scar tissue. CONTRAST:  [id number] FINDINGS: Mild bi atrial enlargement. LAA thrombus present. Possible small PFO Normal ascending thoracic aorta 2.9 cm. Small pericardial effusion mostly posterior to the LV Moderate LVE with diffuse hypokinesis Quantitative EF 21% (EDV 220 cc ESV 174 cc SV 46 cc) No mural apical thrombus or defined RWMAls Delayed enhancement with no  significant gadolinium uptake, scar, infarct or infiltrate. Moderate RVE with global hypokinesis RVEF 25% (EDV 201 cc ESV 151 cc SV 50 cc ) T1 1216 msec ECV 28% T2 48 msec IMPRESSION: 1.  Moderate LVE with global hypokinesis  EF 21% 2. No significant delayed gadolinium uptake, scar, infarct, infiltration 3.  Normal parametric measures 4.  Moderate RVE with hypokinesis RVEF 25% 5.  LAA thrombus present 6.  Possible PFO 7.  Small pericardial effusion mostly posterior to LV 8.  Mild bi atrial enlargement 9.  Normal valves mild appearing MR 10. 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   On: 02/19/2021 19:30   CT HEAD CODE STROKE WO CONTRAST  Result Date: 02/21/2021 CLINICAL DATA:  Code stroke. EXAM: CT HEAD WITHOUT CONTRAST TECHNIQUE: Contiguous axial images were obtained from the base of the skull through the vertex without intravenous contrast. RADIATION DOSE REDUCTION: This exam was performed according to the departmental dose-optimization program which includes automated exposure control, adjustment of the mA and/or kV according to patient size and/or use of iterative reconstruction technique. COMPARISON:  None. FINDINGS: Brain: There is no mass, hemorrhage or extra-axial collection. The size and configuration of the ventricles and extra-axial CSF spaces are normal. The brain parenchyma is normal, without evidence of acute or chronic infarction. Vascular: No abnormal hyperdensity of the major intracranial arteries or dural venous sinuses. No intracranial atherosclerosis. Skull: The visualized skull base, calvarium and extracranial soft tissues are normal. Sinuses/Orbits: No fluid levels or advanced mucosal thickening of the visualized paranasal sinuses. No mastoid or middle ear effusion. The orbits are normal. ASPECTS [facility] Stroke Program Early CT Score) - Ganglionic level  infarction (caudate, lentiform nuclei, internal capsule, insula, M1-M3 cortex): 7 - Supraganglionic infarction (M4-M6 cortex): 3 Total score (0-10 with 10 being normal): 10 IMPRESSION: 1. No acute intracranial abnormality. 2. ASPECTS is 10. These results were called by telephone at the time of interpretation on 02/21/2021 at 2:01 am to provider [facility] , who verbally acknowledged these results. Electronically Signed   By: Ulyses Jarred M.D.   On: 02/21/2021 02:01   CT ANGIO HEAD NECK W WO CM (CODE STROKE)  Result Date: 02/21/2021 CLINICAL DATA:  Code stroke.  Unilateral weakness. EXAM: CT ANGIOGRAPHY HEAD AND NECK TECHNIQUE: Multidetector CT imaging of the head and neck was performed using the standard protocol during bolus administration of intravenous contrast. Multiplanar CT image reconstructions and MIPs were obtained to evaluate the vascular anatomy. Carotid stenosis measurements (when applicable) are obtained utilizing NASCET criteria, using the distal internal carotid diameter as the denominator. RADIATION DOSE REDUCTION: This exam was performed according to the departmental dose-optimization program which includes automated exposure control, adjustment of the mA and/or kV according to patient size and/or use of iterative reconstruction technique. CONTRAST:  83mL OMNIPAQUE IOHEXOL 350 MG/ML SOLN COMPARISON:  None. FINDINGS: CTA NECK FINDINGS SKELETON: There is no bony spinal canal stenosis. No lytic or blastic lesion. OTHER NECK: Normal pharynx, larynx and major salivary glands. No cervical lymphadenopathy. Unremarkable thyroid gland. UPPER CHEST: No pneumothorax or pleural effusion. No nodules or masses. AORTIC ARCH: There is no calcific atherosclerosis of the aortic arch. There is no aneurysm, dissection or hemodynamically significant stenosis of the visualized portion of the aorta. Normal variant aortic arch branching pattern with the brachiocephalic and left common carotid arteries sharing  a common origin. The visualized proximal subclavian arteries are widely patent. RIGHT CAROTID SYSTEM: Normal without aneurysm, dissection or stenosis. LEFT CAROTID SYSTEM: Normal without aneurysm, dissection or stenosis. VERTEBRAL ARTERIES: Left dominant configuration. Both origins are clearly patent. There is no dissection, occlusion or flow-limiting stenosis to the  skull base (V1-V3 segments). CTA HEAD FINDINGS POSTERIOR CIRCULATION: --Vertebral arteries: Normal V4 segments. --Inferior cerebellar arteries: Normal. --Basilar artery: Normal. --Superior cerebellar arteries: Normal. --Posterior cerebral arteries (PCA): Normal. ANTERIOR CIRCULATION: --Intracranial internal carotid arteries: Normal. --Anterior cerebral arteries (ACA): Normal. Both A1 segments are present. Patent anterior communicating artery (a-comm). --Middle cerebral arteries (MCA): Normal. VENOUS SINUSES: As permitted by contrast timing, patent. ANATOMIC VARIANTS: None Review of the MIP images confirms the above findings. IMPRESSION: Normal CTA of the head and neck. Electronically Signed   By: Ulyses Jarred M.D.   On: 02/21/2021 02:12    Cardiac Studies   Echo 02/14/21   1. Left ventricular ejection fraction, by estimation, is 20 to 25%. The  left ventricle has severely decreased function. The left ventricle  demonstrates global hypokinesis. There is mild left ventricular  hypertrophy. Left ventricular diastolic parameters   are indeterminate.   2. Right ventricular systolic function is moderately reduced. The right  ventricular size is mildly enlarged. There is normal pulmonary artery  systolic pressure. The estimated right ventricular systolic pressure is  [id number] mmHg.   3. Left atrial size was moderately dilated.   4. Right atrial size was severely dilated.   5. The mitral valve is normal in structure. Mild to moderate mitral valve  regurgitation. No evidence of mitral stenosis.   6. Tricuspid valve regurgitation is moderate.   7.  The aortic valve was not well visualized. Aortic valve regurgitation  is not visualized. No aortic stenosis is present.   8. The inferior vena cava is dilated in size with >50% respiratory  variability, suggesting right atrial pressure of 8 mmHg.    TEE 02/16/21  1. Left ventricular ejection fraction, by estimation, is 30 to 35%. The  left ventricle has moderately decreased function. The left ventricle  demonstrates global hypokinesis. The left ventricular internal cavity size  was mildly dilated.   2. Right ventricular systolic function is mildly reduced. The right  ventricular size is normal.   3. Left atrial size was appears dilated visually. A left atrial/left  atrial appendage thrombus was detected. The LAA emptying velocity was 22  cm/s.   4. Right atrial size was mildly dilated.   5. The mitral valve is grossly normal. Mild mitral valve regurgitation.  No evidence of mitral stenosis.   6. Tricuspid valve regurgitation is mild to moderate.   7. The aortic valve is tricuspid. Aortic valve regurgitation is not  visualized. No aortic stenosis is present.   8. Cannot exclude a small PFO. Agitated saline contrast bubble study was  positive with shunting observed within 3-6 cardiac cycles suggestive of  interatrial shunt. There is a patent foramen ovale with predominantly  right to left shunting across the  atrial septum.   Patient Profile  56 y.o. male with a PMH of ongoing alcohol abuse, newly diagnosed afib, anxiety, tobacco use disorder who was seen by cardiology on  1/21 for evaluation of afib with rvr now with severe systolic dysfunction , LAA thrombus, and low output state  Assessment & Plan  Acute systolic HF - Jan [id number] echo LVEF 20-25%, global hypokinesis, indet diastolic, mod RV dysfunction - Jan 2023 RHC/LHC: 20% RCA lesion, otherwise normal coronaries - Mean PA 22, PCWP 17, CI 3 - cMRI: LVEF 21%, no signifcant delayed gad uptake. LAA thrombus - from notes suspected EtOH  CM vs tachy mediated given new dx of afib  - negative 731 mL yesterday, neg 5.8 L since admission. Transitioned to oral lasix 60mg    -  medical therapy limited by low bp's though SBPs early on though significantly improved last few days - he has been on digoxin 0.25mg  daily,aldactone 12.5mg  daily. Yesterday we added toprol 12.5mg  daily. Would look to titrate beta blocker for rate control further before adding ACE/ARB/ARNI given still the occaiosnal soft bp       2. Afib/LA appendage clot - new diagnosis this admission, issues with afib with RVR - management complicated by low bp's, was started on amiodarone - TEE showed clot in LA appendage, not able to cardiovert - he is on xarelto started Jan 22   - rates remain elevated on amio gtt. Yesterday we started toprol 12.5mg  daily - today will try shorting acting lopressor 25mg  tid with hold parameters, can consolidate back to long acting once rates controlled. D/c amio gtt, start oral amio 400mg  bid.  - defer anticoagulation to neuro in setting of CVA last night.      3. Code stroke - event last night acute right sided weakness. Fortunately weakness has resolved.  - not tpa candidate per neuro since on anticoag -CT head no acute process - CTA head/neck no acute findings - MRI small acute infarcts high left parietal cortex and left centrum semiovale  - defer any need to hold anticoag to neuro and when to resume given risks of hemorrhagic conversion  For questions or updates, please contact [facility] Please consult [email or web address] for contact info under        Signed, Carlyle Dolly, MD  02/21/2021, 11:49 AM

## 2021-02-21 NOTE — Progress Notes (Signed)
PROGRESS NOTE    Eddie Dixon  [id number] DOB: [birth year] DOA: 02/13/2021 PCP: Cassandria Anger, MD   Brief Narrative:  Eddie Dixon is a 56 y.o. male with medical history significant of EtOH abuse and new diagnosis of A.Fib recently, saw Dr. Gwenlyn Found 1/5, started initially on cardizem and xarelto.  Cardizem made him feel sick so he quit taking this.  Plotnikov started him on metoprolol which he quit taking because he felt it gave him stomach cramps.He also stopped taking Xarelto a couple of days ago due to cost.  Presented to ED with ongoing A.Fib RVR, and peripheral edema in legs worsening over the past couple of days as well as weight gain of 15 to 20 pounds.  Upon arrival to ED, he was in A. fib with RVR with rates around 150.  Soft BPs.  CTA chest negative for PE.  Creatinine up to 1.5.  Admitted to hospitalist and cardiology consulted. Remains in the hospital with inpatient cardiac procedures and suboptimal heart rate control.  Significant event: 1/29, 1 AM in the morning patient was trying to get out of the bed to go to the bathroom and noted to have sudden hemiplegia of right upper extremity 2/5, right lower extremity 0/5.  Stroke alert was called.  Underwent a stroke work-up with negative CT head and negative CT angiogram.  Received Xarelto in the evening. All neurological deficits improved after about 45 minutes to 1-hour.  Seen by neurology.  Assessment & Plan:   Principal Problem:   New onset of congestive heart failure (HCC) Active Problems:   Atrial fibrillation with RVR (HCC)   Acute HFrEF (heart failure with reduced ejection fraction) (HCC)   AKI (acute kidney injury) ([place])   ETOH abuse   Cerebral thrombosis with cerebral infarction  Acute embolic stroke: Right hemiplegia overnight, transient and completely improved.  Known cardiac thrombosis. Continue PT OT and speech therapy.  Already on anticoagulation.  Neurology following. MRI of the brain today to evaluate  extent of infarction.  Atrial fibrillation with RVR: Still with suboptimal heart rate control.   Persistent A. fib. Currently remains on amiodarone infusion.  On digoxin and metoprolol.   Attempted TEE that was aborted due to left atrial appendage clot. Importance of medication compliance including AC reiterated.  Currently on Xarelto. May need additional heart rate control medications, cardiology following.  Acute systolic congestive heart failure: Transthoracic echo shows 20 to 25% ejection fraction.  On Entresto and Aldactone.  Lasix started today.  60 mg daily.  Patient started on IV fluids with overnight events, will discontinue IV fluids.  Bedside swallow evaluation and he should be able to eat. Cardiac cath with nonobstructive coronary artery disease. Cardiac MRI with no evidence of infiltrative disease.  Redemonstration of blood clot. Congestive heart failure is tachycardia mediated.  AKI: Baseline creatinine around 1.1.  Stabilizing at about 1.4-1.5.  Chronic alcoholism: Counseled to quit.  Currently without any withdrawal symptoms.    Prediabetes: Hemoglobin A1c 6.1.  Dietary modification including weight loss recommended.  Obesity: Weight loss counseled and encouraged.  Body mass index is 32.36 kg/m.  Last Weight  Most recent update: 02/20/2021  3:54 AM    Weight  99.4 kg (219 lb 1.6 oz)              DVT prophylaxis: Place TED hose Start: 02/17/21 1346.  Xarelto   Code Status: Full Code  Family Communication: Wife at the bedside Status is: Inpatient  Remains inpatient appropriate because: Needs better control  of A. fib with RVR      Estimated body mass index is 32.36 kg/m as calculated from the following:   Height as of this encounter: 5\' 9"  (1.753 m).   Weight as of this encounter: 99.4 kg.       Consultants:  Cardiology  Procedures:  Cardiac cath 1/27 Cardiac MRI 1/27  Antimicrobials:  Anti-infectives (From admission, onward)    None          Subjective:  Patient seen and examined.  Anxious with overnight events but denies any complaints.  Denies any chest pain or palpitations.  He is able to move his right side without any limitations now.  He tells me that once he was done with CT scans, his power was back. Telemetry shows a flutter with heart rate 80-110.  Objective: Vitals:   02/21/21 0300 02/21/21 0542 02/21/21 0711 02/21/21 0800  BP: 112/90 100/74 (!) 120/96 (!) 117/105  Pulse: 93 82 85 90  Resp: 18 18 18 16   Temp: 97.7 F (36.5 C)  98.2 F (36.8 C) 98.3 F (36.8 C)  TempSrc: Oral  Oral Oral  SpO2: 98% 98% 99% 96%  Weight:      Height:        Intake/Output Summary (Last 24 hours) at 02/21/2021 0956 Last data filed at 02/20/2021 2200 Gross per 24 hour  Intake 1078.6 ml  Output 2050 ml  Net -971.4 ml   Filed Weights   02/18/21 0300 02/19/21 0418 02/20/21 0352  Weight: 103.7 kg 100.8 kg 99.4 kg    Examination:  General: Looks comfortable.  On room air.  Anxious. Cranial nerves with no deficits. No other neurological deficits. Cardiovascular: S1-S2 normal.  Irregularly irregular.  Tachycardic. Respiratory: Bilateral clear.  No added sounds. Gastrointestinal: Soft.  Nontender.  Bowel sound present. Ext: Trace bilateral pedal edema.  No cyanosis or clubbing. Neuro: Alert oriented x4.  No focal deficits. Musculoskeletal: No deformities.   Data Reviewed: I have personally reviewed following labs and imaging studies  CBC: Recent Labs  Lab 02/18/21 0230 02/19/21 0505 02/19/21 1347 02/19/21 1353 02/19/21 1354 02/19/21 1400 02/20/21 0422 02/21/21 0248  WBC 4.3 4.0  --   --   --   --  3.5* 3.6*  HGB 15.2 15.0   < > 16.0 12.9* 15.0 15.5 14.9  HCT 45.7 46.1   < > 47.0 38.0* 44.0 45.9 44.1  MCV 92.9 93.3  --   --   --   --  91.6 91.3  PLT 218 215  --   --   --   --  188 184   < > = values in this interval not displayed.   Basic Metabolic Panel: Recent Labs  Lab 02/17/21 0430 02/18/21 0230  02/19/21 0505 02/19/21 1347 02/19/21 1353 02/19/21 1354 02/19/21 1400 02/20/21 0422 02/21/21 0248  NA 139 141 141   < > 142 149* 144 140 136  K 4.1 4.1 3.6   < > 4.0 2.8* 3.6 3.9 3.5  CL 105 101 104  --   --   --   --  101 100  CO2 25 28 30   --   --   --   --  28 27  GLUCOSE 104* 112* 98  --   --   --   --  193* 111*  BUN 20 19 18   --   --   --   --  15 13  CREATININE 1.56* 1.46* 1.44*  --   --   --   --  1.40* 1.37*  CALCIUM 8.5* 9.0 8.6*  --   --   --   --  8.3* 8.2*   < > = values in this interval not displayed.   GFR: Estimated Creatinine Clearance: 72.5 mL/min (A) (by C-G formula based on SCr of 1.37 mg/dL (H)). Liver Function Tests: No results for input(s): AST, ALT, ALKPHOS, BILITOT, PROT, ALBUMIN in the last 168 hours.  No results for input(s): LIPASE, AMYLASE in the last 168 hours. No results for input(s): AMMONIA in the last 168 hours. Coagulation Profile: No results for input(s): INR, PROTIME in the last 168 hours.  Cardiac Enzymes: No results for input(s): CKTOTAL, CKMB, CKMBINDEX, TROPONINI in the last 168 hours. BNP (last 3 results) No results for input(s): PROBNP in the last 8760 hours. HbA1C: Recent Labs    02/21/21 0336  HGBA1C 5.6    CBG: Recent Labs  Lab 02/21/21 0125  GLUCAP 95   Lipid Profile: Recent Labs    02/21/21 0337  CHOL 152  HDL 47  LDLCALC 93  TRIG 62  CHOLHDL 3.2   Thyroid Function Tests: No results for input(s): TSH, T4TOTAL, FREET4, T3FREE, THYROIDAB in the last 72 hours. Anemia Panel: No results for input(s): VITAMINB12, FOLATE, FERRITIN, TIBC, IRON, RETICCTPCT in the last 72 hours. Sepsis Labs: Recent Labs  Lab 02/17/21 1106  LATICACIDVEN 1.2    Recent Results (from the past 240 hour(s))  Resp Panel by RT-PCR (Flu A&B, Covid) Nasopharyngeal Swab     Status: None   Collection Time: 02/13/21  9:02 PM   Specimen: Nasopharyngeal Swab; Nasopharyngeal(NP) swabs in vial transport medium  Result Value Ref Range Status    SARS Coronavirus 2 by RT PCR NEGATIVE NEGATIVE Final    Comment: (NOTE) SARS-CoV-2 target nucleic acids are NOT DETECTED.  The SARS-CoV-2 RNA is generally detectable in upper respiratory specimens during the acute phase of infection. The lowest concentration of SARS-CoV-2 viral copies this assay can detect is 138 copies/mL. A negative result does not preclude SARS-Cov-2 infection and should not be used as the sole basis for treatment or other patient management decisions. A negative result may occur with  improper specimen collection/handling, submission of specimen other than nasopharyngeal swab, presence of viral mutation(s) within the areas targeted by this assay, and inadequate number of viral copies(<138 copies/mL). A negative result must be combined with clinical observations, patient history, and epidemiological information. The expected result is Negative.  Fact Sheet for Patients:  EntrepreneurPulse.com.au  Fact Sheet for Healthcare Providers:  IncredibleEmployment.be  This test is no t yet approved or cleared by the [place] FDA and  has been authorized for detection and/or diagnosis of SARS-CoV-2 by FDA under an Emergency Use Authorization (EUA). This EUA will remain  in effect (meaning this test can be used) for the duration of the COVID-19 declaration under Section 564(b)(1) of the Act, 21 U.S.C.section 360bbb-3(b)(1), unless the authorization is terminated  or revoked sooner.       Influenza A by PCR NEGATIVE NEGATIVE Final   Influenza B by PCR NEGATIVE NEGATIVE Final    Comment: (NOTE) The Xpert Xpress SARS-CoV-2/FLU/RSV plus assay is intended as an aid in the diagnosis of influenza from Nasopharyngeal swab specimens and should not be used as a sole basis for treatment. Nasal washings and aspirates are unacceptable for Xpert Xpress SARS-CoV-2/FLU/RSV testing.  Fact Sheet for  Patients: EntrepreneurPulse.com.au  Fact Sheet for Healthcare Providers: IncredibleEmployment.be  This test is not yet approved or cleared by the [place] and  has been authorized for detection and/or diagnosis of SARS-CoV-2 by FDA under an Emergency Use Authorization (EUA). This EUA will remain in effect (meaning this test can be used) for the duration of the COVID-19 declaration under Section 564(b)(1) of the Act, 21 U.S.C. section 360bbb-3(b)(1), unless the authorization is terminated or revoked.  Performed at [facility], [place] [address], [place], [place] [address]       Radiology Studies: CARDIAC CATHETERIZATION  Result Date: 02/19/2021   Prox RCA lesion is 20% stenosed.   The left ventricular ejection fraction is less than 25% by visual estimate. Findings: Ao = 96/73 (83) LV = 97/11 RA = 6 RV = 31/7 PA = 30/5 (22) PCW = 17 Fick cardiac output/index = 6.4/3.0 PVR = 0.80 WU SVR = 960 Ao sat = 95% PA sat = 73%, 77% SVC sat = 73% Assessment: 1. Minimal nonobstructive CAD 2. Severe NICM EF ~20% 3. 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 Will get cMRI. Continue medical therapy. Glori Bickers, MD 2:13 PM  MR CARDIAC MORPHOLOGY W WO CONTRAST  Result Date: 02/19/2021 CLINICAL DATA:  Cardiomyopathy EXAM: CARDIAC MRI TECHNIQUE: The patient was scanned on a 1.5 Tesla Siemens magnet. A dedicated cardiac coil was used. Functional imaging was done using Fiesta sequences. 2,3, and 4 chamber views were done to assess for RWMA's. Modified Simpson's rule using a short axis stack was used to calculate an ejection fraction on a dedicated work Conservation officer, nature. The patient received 10 cc of Gadavist. After 10 minutes inversion recovery sequences were used to assess for infiltration and scar tissue. CONTRAST:  [id number] FINDINGS: Mild bi atrial enlargement. LAA thrombus present. Possible  small PFO Normal ascending thoracic aorta 2.9 cm. Small pericardial effusion mostly posterior to the LV Moderate LVE with diffuse hypokinesis Quantitative EF 21% (EDV 220 cc ESV 174 cc SV 46 cc) No mural apical thrombus or defined RWMAls Delayed enhancement with no significant gadolinium uptake, scar, infarct or infiltrate. Moderate RVE with global hypokinesis RVEF 25% (EDV 201 cc ESV 151 cc SV 50 cc ) T1 1216 msec ECV 28% T2 48 msec IMPRESSION: 1.  Moderate LVE with global hypokinesis EF 21% 2. No significant delayed gadolinium uptake, scar, infarct, infiltration 3.  Normal parametric measures 4.  Moderate RVE with hypokinesis RVEF 25% 5.  LAA thrombus present 6.  Possible PFO 7.  Small pericardial effusion mostly posterior to LV 8.  Mild bi atrial enlargement 9.  Normal valves mild appearing MR 10. 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   On: 02/19/2021 19:30   CT HEAD CODE STROKE WO CONTRAST  Result Date: 02/21/2021 CLINICAL DATA:  Code stroke. EXAM: CT HEAD WITHOUT CONTRAST TECHNIQUE: Contiguous axial images were obtained from the base of the skull through the vertex without intravenous contrast. RADIATION DOSE REDUCTION: This exam was performed according to the departmental dose-optimization program which includes automated exposure control, adjustment of the mA and/or kV according to patient size and/or use of iterative reconstruction technique. COMPARISON:  None. FINDINGS: Brain: There is no mass, hemorrhage or extra-axial collection. The size and configuration of the ventricles and extra-axial CSF spaces are normal. The brain parenchyma is normal, without evidence of acute or chronic infarction. Vascular: No abnormal hyperdensity of the major intracranial arteries or dural venous sinuses. No intracranial atherosclerosis. Skull: The visualized skull base, calvarium  and  extracranial soft tissues are normal. Sinuses/Orbits: No fluid levels or advanced mucosal thickening of the visualized paranasal sinuses. No mastoid or middle ear effusion. The orbits are normal. ASPECTS [facility] Stroke Program Early CT Score) - Ganglionic level infarction (caudate, lentiform nuclei, internal capsule, insula, M1-M3 cortex): 7 - Supraganglionic infarction (M4-M6 cortex): 3 Total score (0-10 with 10 being normal): 10 IMPRESSION: 1. No acute intracranial abnormality. 2. ASPECTS is 10. These results were called by telephone at the time of interpretation on 02/21/2021 at 2:01 am to provider [facility] , who verbally acknowledged these results. Electronically Signed   By: Ulyses Jarred M.D.   On: 02/21/2021 02:01   CT ANGIO HEAD NECK W WO CM (CODE STROKE)  Result Date: 02/21/2021 CLINICAL DATA:  Code stroke.  Unilateral weakness. EXAM: CT ANGIOGRAPHY HEAD AND NECK TECHNIQUE: Multidetector CT imaging of the head and neck was performed using the standard protocol during bolus administration of intravenous contrast. Multiplanar CT image reconstructions and MIPs were obtained to evaluate the vascular anatomy. Carotid stenosis measurements (when applicable) are obtained utilizing NASCET criteria, using the distal internal carotid diameter as the denominator. RADIATION DOSE REDUCTION: This exam was performed according to the departmental dose-optimization program which includes automated exposure control, adjustment of the mA and/or kV according to patient size and/or use of iterative reconstruction technique. CONTRAST:  43mL OMNIPAQUE IOHEXOL 350 MG/ML SOLN COMPARISON:  None. FINDINGS: CTA NECK FINDINGS SKELETON: There is no bony spinal canal stenosis. No lytic or blastic lesion. OTHER NECK: Normal pharynx, larynx and major salivary glands. No cervical lymphadenopathy. Unremarkable thyroid gland. UPPER CHEST: No pneumothorax or pleural effusion. No nodules or masses. AORTIC ARCH: There is no  calcific atherosclerosis of the aortic arch. There is no aneurysm, dissection or hemodynamically significant stenosis of the visualized portion of the aorta. Normal variant aortic arch branching pattern with the brachiocephalic and left common carotid arteries sharing a common origin. The visualized proximal subclavian arteries are widely patent. RIGHT CAROTID SYSTEM: Normal without aneurysm, dissection or stenosis. LEFT CAROTID SYSTEM: Normal without aneurysm, dissection or stenosis. VERTEBRAL ARTERIES: Left dominant configuration. Both origins are clearly patent. There is no dissection, occlusion or flow-limiting stenosis to the skull base (V1-V3 segments). CTA HEAD FINDINGS POSTERIOR CIRCULATION: --Vertebral arteries: Normal V4 segments. --Inferior cerebellar arteries: Normal. --Basilar artery: Normal. --Superior cerebellar arteries: Normal. --Posterior cerebral arteries (PCA): Normal. ANTERIOR CIRCULATION: --Intracranial internal carotid arteries: Normal. --Anterior cerebral arteries (ACA): Normal. Both A1 segments are present. Patent anterior communicating artery (a-comm). --Middle cerebral arteries (MCA): Normal. VENOUS SINUSES: As permitted by contrast timing, patent. ANATOMIC VARIANTS: None Review of the MIP images confirms the above findings. IMPRESSION: Normal CTA of the head and neck. Electronically Signed   By: Ulyses Jarred M.D.   On: 02/21/2021 02:12    Scheduled Meds:   stroke: mapping our early stages of recovery book   Does not apply Once   Chlorhexidine Gluconate Cloth  6 each Topical Daily   digoxin  0.25 mg Oral Daily   folic acid  1 mg Oral Daily   furosemide  60 mg Oral Daily   metoprolol succinate  12.5 mg Oral Daily   multivitamin with minerals  1 tablet Oral Daily   sodium chloride flush  10-40 mL Intracatheter Q12H   sodium chloride flush  3 mL Intravenous Q12H   sodium chloride flush  3 mL Intravenous Q12H   spironolactone  12.5 mg Oral Daily   thiamine  100 mg Oral Daily    Continuous  Infusions:  sodium chloride     sodium chloride     sodium chloride 100 mL/hr at 02/21/21 0842   amiodarone 30 mg/hr (02/21/21 0300)     LOS: 8 days   Time spent: 35 minutes  Barb Merino, MD [facility]  02/21/2021, 9:56 AM

## 2021-02-21 NOTE — Plan of Care (Signed)
Problem: Elimination: Goal: Will not experience complications related to bowel motility Outcome: Completed/Met   Problem: Clinical Measurements: Goal: Respiratory complications will improve Outcome: Completed/Met

## 2021-02-21 NOTE — Evaluation (Signed)
Clinical/Bedside Swallow Evaluation Patient Details  Name: Eddie Dixon MRN: [id number] Date of Birth: 1965/05/29  Today's Date: 02/21/2021 Time: SLP Start Time (ACUTE ONLY): 1215 SLP Stop Time (ACUTE ONLY): 1225 SLP Time Calculation (min) (ACUTE ONLY): 10 min  Past Medical History:  Past Medical History:  Diagnosis Date   A-fib [facility])    Anxiety    ED (erectile dysfunction)    Elevated blood pressure    Past Surgical History:  Past Surgical History:  Procedure Laterality Date   BUBBLE STUDY  02/16/2021   Procedure: BUBBLE STUDY;  Surgeon: Berniece Salines, DO;  Location: [place];  Service: Cardiovascular;;   TEE WITHOUT CARDIOVERSION N/A 02/16/2021   Procedure: TRANSESOPHAGEAL ECHOCARDIOGRAM (TEE);  Surgeon: Berniece Salines, DO;  Location: [facility];  Service: Cardiovascular;  Laterality: N/A;   HPI:  Pt is a 56 y/o male admitted secondary to a-fib with RVR now with severe systolic dysfunction. Of note, pt also having R-sided weakness on the morning of 02/21/21 while admitted. Code stroke was activated. Neurology was consulted, awaiting MRI results. Symptoms resolved at the time of eval. PMH including but not limited to ETOH abuse (ongoing), newly diagnosed afib, anxiety, tobacco use disorder, CHF.    Assessment / Plan / Recommendation  Clinical Impression  Patient's swallow function appears to be WNL and without any overt s/s aspiration or penetration. Patient denies any h/o dysphagia. Speech-language evaluation was ordered however SLP screened and full evaluation was not warranted. Patient safe to start regular solids, thin liquids diet and no f/u SLP services needed. SLP Visit Diagnosis: Dysphagia, unspecified (R13.10)    Aspiration Risk  No limitations    Diet Recommendation Regular;Thin liquid   Liquid Administration via: Cup;Straw Medication Administration: Whole meds with liquid Supervision: Patient able to self feed    Other  Recommendations Oral Care Recommendations:  Oral care BID;Patient independent with oral care    Recommendations for follow up therapy are one component of a multi-disciplinary discharge planning process, led by the attending physician.  Recommendations may be updated based on patient status, additional functional criteria and insurance authorization.  Follow up Recommendations No SLP follow up      Assistance Recommended at Discharge None  Functional Status Assessment Patient has not had a recent decline in their functional status  Frequency and Duration   N/A         Prognosis   N/A     Swallow Study   General Date of Onset: 02/21/21 HPI: Pt is a 56 y/o male admitted secondary to a-fib with RVR now with severe systolic dysfunction. Of note, pt also having R-sided weakness on the morning of 02/21/21 while admitted. Code stroke was activated. Neurology was consulted, awaiting MRI results. Symptoms resolved at the time of eval. PMH including but not limited to ETOH abuse (ongoing), newly diagnosed afib, anxiety, tobacco use disorder, CHF. Type of Study: Bedside Swallow Evaluation Previous Swallow Assessment: none found Diet Prior to this Study: NPO Temperature Spikes Noted: No Respiratory Status: Room air History of Recent Intubation: No Behavior/Cognition: Alert;Pleasant mood;Cooperative Oral Cavity Assessment: Within Functional Limits Oral Care Completed by SLP: No Oral Cavity - Dentition: Adequate natural dentition Vision: Functional for self-feeding Self-Feeding Abilities: Able to feed self Patient Positioning: Upright in bed Baseline Vocal Quality: Normal Volitional Cough: Strong Volitional Swallow: Able to elicit    Oral/Motor/Sensory Function Overall Oral Motor/Sensory Function: Within functional limits   Ice Chips     Thin Liquid Thin Liquid: Within functional limits Presentation: Straw  Nectar Thick     Honey Thick     Puree Puree: Not tested   Solid     Solid: Not tested     [name], MA,  CCC-SLP Speech Therapy

## 2021-02-21 NOTE — Progress Notes (Addendum)
STROKE TEAM PROGRESS NOTE   INTERVAL HISTORY No family at the bedside. Patient reports right side paralysis (right arm and right leg) for 30-45 minutes last night. Patient reports feeling numb and weak on right sided. Right eye vision seemed foggy with little dots. By [id number] everything was back to normal. Patient reports feeling back to baseline at time of assessment.   Patient reports BP meds made his stomach upset. Xarelto was $500 at the [facility] and he had ran out of samples from cardiology which led to his noncompliance. He does have insurance. Wants TOC pharmacy meds to beds at discharge.   Vitals:   02/21/21 0126 02/21/21 0300 02/21/21 0542 02/21/21 0711  BP: (!) 133/104 112/90 100/74 (!) 120/96  Pulse: 100 93 82 85  Resp: 20 18 18 18   Temp:  97.7 F (36.5 C)  98.2 F (36.8 C)  TempSrc:  Oral  Oral  SpO2:  98% 98% 99%  Weight:      Height:       CBC:  Recent Labs  Lab 02/20/21 0422 02/21/21 0248  WBC 3.5* 3.6*  HGB 15.5 14.9  HCT 45.9 44.1  MCV 91.6 91.3  PLT 188 [id number]   Basic Metabolic Panel:  Recent Labs  Lab 02/20/21 0422 02/21/21 0248  NA 140 136  K 3.9 3.5  CL 101 100  CO2 28 27  GLUCOSE 193* 111*  BUN 15 13  CREATININE 1.40* 1.37*  CALCIUM 8.3* 8.2*   Lipid Panel:  Recent Labs  Lab 02/21/21 0337  CHOL 152  TRIG 62  HDL 47  CHOLHDL 3.2  VLDL 12  LDLCALC 93   HgbA1c:  Recent Labs  Lab 02/21/21 0336  HGBA1C 5.6   Urine Drug Screen: No results for input(s): LABOPIA, COCAINSCRNUR, LABBENZ, AMPHETMU, THCU, LABBARB in the last 168 hours.  Alcohol Level No results for input(s): ETH in the last 168 hours.  IMAGING past 24 hours CT HEAD CODE STROKE WO CONTRAST  Result Date: 02/21/2021 CLINICAL DATA:  Code stroke. EXAM: CT HEAD WITHOUT CONTRAST TECHNIQUE: Contiguous axial images were obtained from the base of the skull through the vertex without intravenous contrast. RADIATION DOSE REDUCTION: This exam was performed according to the  departmental dose-optimization program which includes automated exposure control, adjustment of the mA and/or kV according to patient size and/or use of iterative reconstruction technique. COMPARISON:  None. FINDINGS: Brain: There is no mass, hemorrhage or extra-axial collection. The size and configuration of the ventricles and extra-axial CSF spaces are normal. The brain parenchyma is normal, without evidence of acute or chronic infarction. Vascular: No abnormal hyperdensity of the major intracranial arteries or dural venous sinuses. No intracranial atherosclerosis. Skull: The visualized skull base, calvarium and extracranial soft tissues are normal. Sinuses/Orbits: No fluid levels or advanced mucosal thickening of the visualized paranasal sinuses. No mastoid or middle ear effusion. The orbits are normal. ASPECTS [facility] Stroke Program Early CT Score) - Ganglionic level infarction (caudate, lentiform nuclei, internal capsule, insula, M1-M3 cortex): 7 - Supraganglionic infarction (M4-M6 cortex): 3 Total score (0-10 with 10 being normal): 10 IMPRESSION: 1. No acute intracranial abnormality. 2. ASPECTS is 10. These results were called by telephone at the time of interpretation on 02/21/2021 at 2:01 am to provider [facility] , who verbally acknowledged these results. Electronically Signed   By: Ulyses Jarred M.D.   On: 02/21/2021 02:01   CT ANGIO HEAD NECK W WO CM (CODE STROKE)  Result Date: 02/21/2021 CLINICAL DATA:  Code stroke.  Unilateral  weakness. EXAM: CT ANGIOGRAPHY HEAD AND NECK TECHNIQUE: Multidetector CT imaging of the head and neck was performed using the standard protocol during bolus administration of intravenous contrast. Multiplanar CT image reconstructions and MIPs were obtained to evaluate the vascular anatomy. Carotid stenosis measurements (when applicable) are obtained utilizing NASCET criteria, using the distal internal carotid diameter as the denominator. RADIATION DOSE REDUCTION: This  exam was performed according to the departmental dose-optimization program which includes automated exposure control, adjustment of the mA and/or kV according to patient size and/or use of iterative reconstruction technique. CONTRAST:  35mL OMNIPAQUE IOHEXOL 350 MG/ML SOLN COMPARISON:  None. FINDINGS: CTA NECK FINDINGS SKELETON: There is no bony spinal canal stenosis. No lytic or blastic lesion. OTHER NECK: Normal pharynx, larynx and major salivary glands. No cervical lymphadenopathy. Unremarkable thyroid gland. UPPER CHEST: No pneumothorax or pleural effusion. No nodules or masses. AORTIC ARCH: There is no calcific atherosclerosis of the aortic arch. There is no aneurysm, dissection or hemodynamically significant stenosis of the visualized portion of the aorta. Normal variant aortic arch branching pattern with the brachiocephalic and left common carotid arteries sharing a common origin. The visualized proximal subclavian arteries are widely patent. RIGHT CAROTID SYSTEM: Normal without aneurysm, dissection or stenosis. LEFT CAROTID SYSTEM: Normal without aneurysm, dissection or stenosis. VERTEBRAL ARTERIES: Left dominant configuration. Both origins are clearly patent. There is no dissection, occlusion or flow-limiting stenosis to the skull base (V1-V3 segments). CTA HEAD FINDINGS POSTERIOR CIRCULATION: --Vertebral arteries: Normal V4 segments. --Inferior cerebellar arteries: Normal. --Basilar artery: Normal. --Superior cerebellar arteries: Normal. --Posterior cerebral arteries (PCA): Normal. ANTERIOR CIRCULATION: --Intracranial internal carotid arteries: Normal. --Anterior cerebral arteries (ACA): Normal. Both A1 segments are present. Patent anterior communicating artery (a-comm). --Middle cerebral arteries (MCA): Normal. VENOUS SINUSES: As permitted by contrast timing, patent. ANATOMIC VARIANTS: None Review of the MIP images confirms the above findings. IMPRESSION: Normal CTA of the head and neck. Electronically  Signed   By: Ulyses Jarred M.D.   On: 02/21/2021 02:12    PHYSICAL EXAM  Temp:  [97.7 F (36.5 C)-98.9 F (37.2 C)] 98.3 F (36.8 C) (01/29 0800) Pulse Rate:  [78-119] 90 (01/29 0800) Resp:  [16-20] 16 (01/29 0800) BP: (98-139)/(63-109) 117/105 (01/29 0800) SpO2:  [94 %-100 %] 96 % (01/29 0800)  General - Well nourished, well developed, in no apparent distress.  Cardiovascular - Tachycardic.  Mental Status -  Level of arousal and orientation to time, place, and person were intact. Language including expression, naming, repetition, comprehension was assessed and found intact. Attention span and concentration were normal. Recent and remote memory were intact. Fund of Knowledge was assessed and was intact.  Cranial Nerves II - XII - II - Visual field intact OU. III, IV, VI - Extraocular movements intact. V - Facial sensation intact bilaterally. VII - Facial movement intact bilaterally. VIII - Hearing & vestibular intact bilaterally. X - Palate elevates symmetrically. XI - Chin turning & shoulder shrug intact bilaterally. XII - Tongue protrusion intact.  Motor Strength - The patients strength was normal in all extremities and pronator drift was absent.  Bulk was normal and fasciculations were absent.   Motor Tone - Muscle tone was assessed at the neck and appendages and was normal.  Reflexes - The patients reflexes were symmetrical in all extremities and he had no pathological reflexes.  Sensory - Light touch, temperature/pinprick were assessed and were symmetrical.    Coordination - The patient had normal movements in the hands and feet with no ataxia or dysmetria.  Tremor was absent.  Gait and Station - deferred. NIHSS: 0  ASSESSMENT/PLAN Eddie Dixon is a 56 y.o. male with history of CHF, A-fib on xarelto, AKI, lower extremity edema, initially admitted for a Rt/Lt heart cath and coronary angiography. A code stroke was called 1/29 at 1230am for a new onset of  right side weakness.   TIA vs Stroke Code Stroke- No acute abnormality. ASPECTS 10.    CTA head & neck Normal CTA of the head and neck MRI  Pending 2D Echo EF 20-25% TEE- EF 30-35%, positive bubble study, noted to have left atrial appendage clot LDL 93 HgbA1c 5.6 VTE prophylaxis - SCDs    Diet   Diet NPO time specified   Xarelto (rivaroxaban) daily (not taking) prior to admission, he was given dose of Xarelto yesterday. Therapy recommendations:  pending Disposition:  pending  Hypertension Home meds:  metoprolol Stable Permissive hypertension (OK if < 220/120) but gradually normalize in 5-7 days Long-term BP goal normotensive  Hyperlipidemia Home meds:  none LDL 93, goal < 70 Recommend adding statin  Continue statin at discharge  Other Stroke Risk Factors Smokeless tobacco ETOH use, advised to drink no more than 1-2 drink(s) a day Obesity, Body mass index is 32.36 kg/m., BMI >/= 30 associated with increased stroke risk, recommend weight loss, diet and exercise as appropriate  Congestive heart failure  Other Active Problems Afib w/ RVR Acute systolic CHF AKI Chronic Alcoholism Prediabetes  Hospital day # 8  Eddie Ravens, MD PGY1 Resident  Small infarcts in the left high parietal cortex and left centrum semiovale on MRI etiology due to left atrial appendage thrombus and noncompliance with Xarelto.  Exam is nonfocal.  NIH stroke scale 0.  Strokes are relatively small, restart anticoagulation-   recommend switching to Eliquis as this may be covered better with his insurance.  Could also consider Coumadin if this is a cheaper option.  We will have transition of care team evaluate.  Discussed with him that it is critical he take his anticoagulation medication for risk of a catastrophic stroke.  He understands the risk.  Same day note there will be no charge entered.  To contact Stroke Continuity provider, please refer to [email or web address] After hours, contact General  Neurology

## 2021-02-21 NOTE — Progress Notes (Signed)
Pt's last normal was around 12:30-12:40. Thanks Lavonda Jumbo RN.

## 2021-02-22 ENCOUNTER — Encounter (HOSPITAL_COMMUNITY): Payer: Self-pay | Admitting: Internal Medicine

## 2021-02-22 ENCOUNTER — Other Ambulatory Visit (HOSPITAL_COMMUNITY): Payer: Self-pay

## 2021-02-22 DIAGNOSIS — I5021 Acute systolic (congestive) heart failure: Secondary | ICD-10-CM | POA: Diagnosis not present

## 2021-02-22 LAB — BASIC METABOLIC PANEL
Anion gap: 8 (ref 5–15)
BUN: 12 mg/dL (ref 6–20)
CO2: 28 mmol/L (ref 22–32)
Calcium: 8.5 mg/dL — ABNORMAL LOW (ref 8.9–10.3)
Chloride: 105 mmol/L (ref 98–111)
Creatinine, Ser: 1.45 mg/dL — ABNORMAL HIGH (ref 0.61–1.24)
GFR, Estimated: 58 mL/min — ABNORMAL LOW (ref >60)
Glucose, Bld: 95 mg/dL (ref 70–99)
Potassium: 4 mmol/L (ref 3.5–5.1)
Sodium: 141 mmol/L (ref 135–145)

## 2021-02-22 MED ORDER — PANTOPRAZOLE SODIUM 40 MG PO TBEC
40.0000 mg | DELAYED_RELEASE_TABLET | Freq: Every day | ORAL | Status: DC
  Administered 2021-02-22 – 2021-02-23 (×2): 40 mg via ORAL
  Filled 2021-02-22 (×2): qty 1

## 2021-02-22 MED ORDER — CARVEDILOL 6.25 MG PO TABS
6.2500 mg | ORAL_TABLET | Freq: Two times a day (BID) | ORAL | Status: DC
  Administered 2021-02-22 – 2021-02-23 (×3): 6.25 mg via ORAL
  Filled 2021-02-22 (×3): qty 1

## 2021-02-22 MED ORDER — DAPAGLIFLOZIN PROPANEDIOL 10 MG PO TABS
10.0000 mg | ORAL_TABLET | Freq: Every day | ORAL | Status: DC
  Administered 2021-02-22 – 2021-02-23 (×2): 10 mg via ORAL
  Filled 2021-02-22 (×2): qty 1

## 2021-02-22 MED ORDER — EMPAGLIFLOZIN 10 MG PO TABS: 10.0000 mg | ORAL_TABLET | Freq: Every day | ORAL | Status: DC

## 2021-02-22 NOTE — Progress Notes (Signed)
Upon rounding on patient, RN found patient taking a shower. Explained to patient that he could not shower with a PICC. Education given about safety with a PICC. Primary RN and Velia, RN changed dressing, caps, and lines.

## 2021-02-22 NOTE — Progress Notes (Addendum)
Advanced Heart Failure Rounding Note  PCP-Cardiologist: Nanetta Batty, MD   Subjective:   Admit weight 236--->222  1/25 Started on amio drip and heparin drip for A fib RVR. LAA thrombus noted. Not a candidate for DC-CV. Diuresing with IV lasix.  1/26 Diuresed with IV lasix. Negative 3.2 liters. Weight down another 6 pounds 1/27 cath minimal CAD, well compensated filling pressures and normal output. 1/29 RUE weakness resolved after 45 min.  Code Stroke called. CTA- head neck normal. CT head- no acute process. MRI- Small acute infarcts high left parietal cortex and left centrum semiovale.Placed on eliquis.   Feeling better. Denies SOB. Wants to come off metoprolol due previous GI upset.   Objective:   Weight Range: 100.5 kg Body mass index is 32.72 kg/m.   Vital Signs:   Temp:  [97.8 F (36.6 C)-98.3 F (36.8 C)] 98.3 F (36.8 C) (01/30 0729) Pulse Rate:  [72-100] 86 (01/30 0729) Resp:  [18] 18 (01/30 0729) BP: (105-121)/(89-98) 121/93 (01/30 0729) SpO2:  [98 %-100 %] 100 % (01/30 0729) Weight:  [100.5 kg] 100.5 kg (01/30 0419) Last BM Date: 02/17/21  Weight change: Filed Weights   02/19/21 0418 02/20/21 0352 02/22/21 0419  Weight: 100.8 kg 99.4 kg 100.5 kg    Intake/Output:   Intake/Output Summary (Last 24 hours) at 02/22/2021 0827 Last data filed at 02/22/2021 0423 Gross per 24 hour  Intake 2144.75 ml  Output 875 ml  Net 1269.75 ml      Physical Exam  CVP 7-8   General:   No resp difficulty HEENT: normal Neck: supple. no JVD. Carotids 2+ bilat; no bruits. No lymphadenopathy or thryomegaly appreciated. Cor: PMI nondisplaced. Irregular rate & rhythm. No rubs, gallops or murmurs. Lungs: clear Abdomen: soft, nontender, nondistended. No hepatosplenomegaly. No bruits or masses. Good bowel sounds. Extremities: no cyanosis, clubbing, rash, edema. RUE PICC  Neuro: alert & orientedx3, cranial nerves grossly intact. moves all 4 extremities w/o difficulty. Affect  pleasant   Telemetry    A fib 70-90s   EKG    N/A  Labs    CBC Recent Labs    02/20/21 0422 02/21/21 0248  WBC 3.5* 3.6*  HGB 15.5 14.9  HCT 45.9 44.1  MCV 91.6 91.3  PLT 188 184   Basic Metabolic Panel Recent Labs    24/40/10 0248 02/22/21 0504  NA 136 141  K 3.5 4.0  CL 100 105  CO2 27 28  GLUCOSE 111* 95  BUN 13 12  CREATININE 1.37* 1.45*  CALCIUM 8.2* 8.5*   Liver Function Tests No results for input(s): AST, ALT, ALKPHOS, BILITOT, PROT, ALBUMIN in the last 72 hours. No results for input(s): LIPASE, AMYLASE in the last 72 hours. Cardiac Enzymes No results for input(s): CKTOTAL, CKMB, CKMBINDEX, TROPONINI in the last 72 hours.  BNP: BNP (last 3 results) Recent Labs    02/13/21 1533  BNP 1,340.6*    ProBNP (last 3 results) No results for input(s): PROBNP in the last 8760 hours.   D-Dimer No results for input(s): DDIMER in the last 72 hours. Hemoglobin A1C Recent Labs    02/21/21 0336  HGBA1C 5.6    Fasting Lipid Panel Recent Labs    02/21/21 0337  CHOL 152  HDL 47  LDLCALC 93  TRIG 62  CHOLHDL 3.2   Thyroid Function Tests No results for input(s): TSH, T4TOTAL, T3FREE, THYROIDAB in the last 72 hours.  Invalid input(s): FREET3  Other results:   Imaging    MR BRAIN  WO CONTRAST  Result Date: 02/21/2021 CLINICAL DATA:  Stroke follow-up EXAM: MRI HEAD WITHOUT CONTRAST TECHNIQUE: Multiplanar, multiecho pulse sequences of the brain and surrounding structures were obtained without intravenous contrast. COMPARISON:  Head CT and CTA from yesterday FINDINGS: Brain: Tiny acute to subacute infarcts in the left anterior parietal cortex and centrum semiovale. Subacute timing is considered given the fairly weakly restricted diffusion, although history suggests acute timing. No hemorrhage, hydrocephalus, mass, or collection. Brain volume is normal. No visible prior infarction. Vascular: Normal flow voids. Skull and upper cervical spine: Normal  marrow signal. Sinuses/Orbits: Negative. IMPRESSION: Small acute infarcts in the high left parietal cortex and left centrum semiovale. Electronically Signed   By: Tiburcio Pea M.D.   On: 02/21/2021 11:41     Medications:     Scheduled Medications:  amiodarone  400 mg Oral BID   apixaban  5 mg Oral BID   atorvastatin  40 mg Oral Daily   Chlorhexidine Gluconate Cloth  6 each Topical Daily   digoxin  0.25 mg Oral Daily   folic acid  1 mg Oral Daily   furosemide  60 mg Oral Daily   metoprolol tartrate  25 mg Oral TID   multivitamin with minerals  1 tablet Oral Daily   sodium chloride flush  10-40 mL Intracatheter Q12H   sodium chloride flush  3 mL Intravenous Q12H   sodium chloride flush  3 mL Intravenous Q12H   spironolactone  12.5 mg Oral Daily   thiamine  100 mg Oral Daily    Infusions:  sodium chloride     sodium chloride      PRN Medications: sodium chloride, sodium chloride, acetaminophen, ondansetron (ZOFRAN) IV, sodium chloride flush, sodium chloride flush, sodium chloride flush, traZODone    Patient Profile   56 y.o. male with history of ETOH abuse and family hx premature CAD. Admitted with AF with RVR and acute systolic CHF (new).  Assessment/Plan   Acute systolic CHF: -Suspect tachymediated +- ETOH.  -Echo 02/14/21: EF 20-25%, RV moderately reduced, biatrial enlargement, mild to moderate MR, moderate TR -TEE 02/16/21: EF 30-35%, RV mildly reduced, LAA thrombus, possible  -1/27 cMRI EF 21% RV 25%. LAA thrombus. No scar. NICM Tachy mediated + ETOH.  - 1/27 Cath- minimal CAD, well compensated filling pressures and normal output.  - CVP 7-8. Volume status stable.  - Continue digoxin 0.25 mg daily - Stop metoprolol tartrate. He says he had GI upset when taking in the past. Stop and start coreg 6.25 mg twice a day. Add protonix.  -No ARB/ARNi for now with low blood pressure earlier today -Continue spiro 12.5 mg daily -Start jardiance 10 mg daily. Stop lasix.   -Consult cardiac rehab   2. Atrial fibrillation with RVR: -Newly diagnosed earlier this month. In setting of ETOH use -LAA thrombus noted on TEE, DCCV not currently an option - Remains in A fib but rate better controlled.  - Continue amio 400 mg twice a day x 7 days, then 200 mg twice a day  -Off heparin drip and switched to eliquis 5 mg twice a day.  - Discussed importance of compliance with anticoagulants.  - will need outpatient sleep study  - Discussed ETOH cessation.   3. ETOH abuse: - Discussed importance of cessation.  -CIWA per [facility] -Consult HF CSW   4. Prediabetes: -A1c 5.6 today.    5. AKI: -In setting of acute CHF +/- contrast -Scr peaked at 1.8, 1.4 today (baseline 1.1)   6. Right pleural  effusion: -Moderate on CTA 01/21  7. LAA Clot  Yesterday switched to eliquis 5 mg twice a day  - As above discussed   8. Stroke 1/29 RUE weakness--> Symptoms resolved after 45 minutes. resolved.  CTA- head neck normal CT head- no acute process MRI- Small acute infarcts high left parietal cortex and left centrum semiovale. HgbA1C 5.6  On eliquis 5 mg twice a day.    Checking on copay for  eliquis & jardiance. Working with pharmacy.   HF follow up 2/6 at 2:30.    Length of Stay: 9  Amy Clegg, NP  02/22/2021, 8:27 AM  Advanced Heart Failure Team Pager [phone] (M-F; 7a - 5p)  Please contact [facility] for night-coverage after hours (5p -7a ) and weekends on [email or web address]  Patient seen and examined with the above-signed Advanced Practice Provider and/or Housestaff. I personally reviewed laboratory data, imaging studies and relevant notes. I independently examined the patient and formulated the important aspects of the plan. I have edited the note to reflect any of my changes or salient points. I have personally discussed the plan with the patient and/or family.  Had presumed embolic (vs small vessel TIA) over the weekend. Symptoms completely resolved now. Remains in  AF. Rated control on amio and b-blocker. Now on apixaban. Denies SOB, orthopnea or PND.   General:  Well appearing. No resp difficulty HEENT: normal Neck: supple. no JVD. Carotids 2+ bilat; no bruits. No lymphadenopathy or thryomegaly appreciated. Cor: PMI nondisplaced. Irregular rate & rhythm. No rubs, gallops or murmurs. Lungs: clear Abdomen: soft, nontender, nondistended. No hepatosplenomegaly. No bruits or masses. Good bowel sounds. Extremities: no cyanosis, clubbing, rash, edema Neuro: alert & orientedx3, cranial nerves grossly intact. moves all 4 extremities w/o difficulty. Affect pleasant  He has recovered from TIA. HF is stabilizing. I discussed with Stroke team and TRH at bedside. Will watch one more day. If stable we can d/c in am.   Arvilla Meres, MD  11:37 AM

## 2021-02-22 NOTE — Progress Notes (Signed)
PROGRESS NOTE    Eddie Dixon  [id number] DOB: Sep 24, 1965 DOA: 02/13/2021 PCP: Cassandria Anger, MD   Brief Narrative:  Eddie Dixon is a 56 y.o. male with medical history significant of EtOH abuse and new diagnosis of A.Fib recently, saw Dr. Gwenlyn Found 1/5, started initially on cardizem and xarelto.  Cardizem made him feel sick so he quit taking this.  Plotnikov started him on metoprolol which he quit taking because he felt it gave him stomach cramps.He also stopped taking Xarelto a couple of days ago due to cost.  Presented to ED with ongoing A.Fib RVR, and peripheral edema in legs worsening over the past couple of days as well as weight gain of 15 to 20 pounds.  Upon arrival to ED, he was in A. fib with RVR with rates around 150.  Soft BPs.  CTA chest negative for PE.  Creatinine up to 1.5.  Admitted to hospitalist and cardiology consulted. Remains in the hospital with inpatient cardiac procedures and suboptimal heart rate control.  Significant event: 1/29, 1 AM in the morning patient was trying to get out of the bed to go to the bathroom and noted to have sudden hemiplegia of right upper extremity 2/5, right lower extremity 0/5.  Stroke alert was called.  Underwent stroke work-up with negative CT head and negative CT angiogram.  Received Xarelto in the evening. All neurological deficits improved after about 45 minutes to 1-hour.  Seen by neurology.  MRI consistent with left MCA stroke.  No remaining neurological deficit.  Assessment & Plan:   Principal Problem:   New onset of congestive heart failure (HCC) Active Problems:   Atrial fibrillation with RVR (HCC)   Acute HFrEF (heart failure with reduced ejection fraction) (HCC)   AKI (acute kidney injury) ([place])   ETOH abuse   Cerebral thrombosis with cerebral infarction  Acute embolic stroke/subcortical stroke left MCA: Right hemiplegia overnight, transient and completely improved.  Known cardiac thrombosis. Continue PT OT and  speech therapy.  Currently no deficits.  Already on anticoagulation.  Neurology following. Started on Eliquis.  Atrial fibrillation with RVR: Remains persistent A. fib but better rate control today. Patient was on amiodarone infusion, changed to oral amiodarone.  Now on digoxin.  Started on Coreg. Attempted TEE that was aborted due to left atrial appendage clot. Importance of medication compliance including AC reiterated.  Currently on Eliquis.  Acute systolic congestive heart failure: Transthoracic echo shows 20 to 25% ejection fraction.  On Entresto and Aldactone.  Oral Lasix.  Cardiac cath with nonobstructive coronary artery disease. Cardiac MRI with no evidence of infiltrative disease.  Redemonstration of blood clot. Congestive heart failure is tachycardia mediated.  AKI: Baseline creatinine around 1.1.  Stabilizing at about 1.4-1.5.  Chronic alcoholism: Counseled to quit.  Currently without any withdrawal symptoms.    Prediabetes: Hemoglobin A1c 6.1.  Dietary modification including weight loss recommended.  Obesity: Weight loss counseled and encouraged.  Body mass index is 32.72 kg/m.  Last Weight  Most recent update: 02/22/2021  4:23 AM    Weight  100.5 kg (221 lb 9.6 oz)              DVT prophylaxis: Place TED hose Start: 02/17/21 1346.  Xarelto   Code Status: Full Code  Family Communication: None today. Status is: Inpatient  Remains inpatient appropriate because: Changing to oral medications today.  Due to previous significant difficulty maintaining heart rate control, cardiology recommended monitoring for 1 more day.      Estimated  body mass index is 32.72 kg/m as calculated from the following:   Height as of this encounter: 5\' 9"  (1.753 m).   Weight as of this encounter: 100.5 kg.       Consultants:  Cardiology Neurology  Procedures:  Cardiac cath 1/27 Cardiac MRI 1/27  Antimicrobials:  Anti-infectives (From admission, onward)    None          Subjective:  Patient seen and examined.  No overnight events.  Able to move around with no neurological deficits.  Denies any chest pain or palpitations. No family at the bedside. Slightly anxious with thoughts of going home, however feels comfortable. Discussed with cardiology and neurology at the bedside, given his significant events, recommended monitoring on oral medications today.  Objective: Vitals:   02/21/21 2100 02/22/21 0000 02/22/21 0419 02/22/21 0729  BP: (!) 116/93 (!) 111/94 110/89 (!) 121/93  Pulse: 93 72  86  Resp: 18 18 18 18   Temp: 98.3 F (36.8 C) 98.2 F (36.8 C) 97.8 F (36.6 C) 98.3 F (36.8 C)  TempSrc: Oral Oral Oral Oral  SpO2: 98% 98% 100% 100%  Weight:   100.5 kg   Height:        Intake/Output Summary (Last 24 hours) at 02/22/2021 1138 Last data filed at 02/22/2021 0924 Gross per 24 hour  Intake 2264.75 ml  Output 1225 ml  Net 1039.75 ml   Filed Weights   02/19/21 0418 02/20/21 0352 02/22/21 0419  Weight: 100.8 kg 99.4 kg 100.5 kg    Examination:  General: Looks comfortable.  On room air.  Cranial nerves with no deficits. No other neurological deficits. Cardiovascular: S1-S2 normal.  Irregularly irregular.  Respiratory: Bilateral clear.  No added sounds. Gastrointestinal: Soft.  Nontender.  Bowel sound present. Ext: Trace bilateral pedal edema.  No cyanosis or clubbing. Neuro: Alert oriented x4.  No focal deficits. Musculoskeletal: No deformities.   Data Reviewed: I have personally reviewed following labs and imaging studies  CBC: Recent Labs  Lab 02/18/21 0230 02/19/21 0505 02/19/21 1347 02/19/21 1353 02/19/21 1354 02/19/21 1400 02/20/21 0422 02/21/21 0248  WBC 4.3 4.0  --   --   --   --  3.5* 3.6*  HGB 15.2 15.0   < > 16.0 12.9* 15.0 15.5 14.9  HCT 45.7 46.1   < > 47.0 38.0* 44.0 45.9 44.1  MCV 92.9 93.3  --   --   --   --  91.6 91.3  PLT 218 215  --   --   --   --  188 184   < > = values in this interval not  displayed.   Basic Metabolic Panel: Recent Labs  Lab 02/18/21 0230 02/19/21 0505 02/19/21 1347 02/19/21 1354 02/19/21 1400 02/20/21 0422 02/21/21 0248 02/22/21 0504  NA 141 141   < > 149* 144 140 136 141  K 4.1 3.6   < > 2.8* 3.6 3.9 3.5 4.0  CL 101 104  --   --   --  101 100 105  CO2 28 30  --   --   --  28 27 28   GLUCOSE 112* 98  --   --   --  193* 111* 95  BUN 19 18  --   --   --  15 13 12   CREATININE 1.46* 1.44*  --   --   --  1.40* 1.37* 1.45*  CALCIUM 9.0 8.6*  --   --   --  8.3* 8.2* 8.5*   < > =  values in this interval not displayed.   GFR: Estimated Creatinine Clearance: 68.8 mL/min (A) (by C-G formula based on SCr of 1.45 mg/dL (H)). Liver Function Tests: No results for input(s): AST, ALT, ALKPHOS, BILITOT, PROT, ALBUMIN in the last 168 hours.  No results for input(s): LIPASE, AMYLASE in the last 168 hours. No results for input(s): AMMONIA in the last 168 hours. Coagulation Profile: No results for input(s): INR, PROTIME in the last 168 hours.  Cardiac Enzymes: No results for input(s): CKTOTAL, CKMB, CKMBINDEX, TROPONINI in the last 168 hours. BNP (last 3 results) No results for input(s): PROBNP in the last 8760 hours. HbA1C: Recent Labs    02/21/21 0336  HGBA1C 5.6    CBG: Recent Labs  Lab 02/21/21 0125  GLUCAP 95   Lipid Profile: Recent Labs    02/21/21 0337  CHOL 152  HDL 47  LDLCALC 93  TRIG 62  CHOLHDL 3.2   Thyroid Function Tests: No results for input(s): TSH, T4TOTAL, FREET4, T3FREE, THYROIDAB in the last 72 hours. Anemia Panel: No results for input(s): VITAMINB12, FOLATE, FERRITIN, TIBC, IRON, RETICCTPCT in the last 72 hours. Sepsis Labs: Recent Labs  Lab 02/17/21 1106  LATICACIDVEN 1.2    Recent Results (from the past 240 hour(s))  Resp Panel by RT-PCR (Flu A&B, Covid) Nasopharyngeal Swab     Status: None   Collection Time: 02/13/21  9:02 PM   Specimen: Nasopharyngeal Swab; Nasopharyngeal(NP) swabs in vial transport medium   Result Value Ref Range Status   SARS Coronavirus 2 by RT PCR NEGATIVE NEGATIVE Final    Comment: (NOTE) SARS-CoV-2 target nucleic acids are NOT DETECTED.  The SARS-CoV-2 RNA is generally detectable in upper respiratory specimens during the acute phase of infection. The lowest concentration of SARS-CoV-2 viral copies this assay can detect is 138 copies/mL. A negative result does not preclude SARS-Cov-2 infection and should not be used as the sole basis for treatment or other patient management decisions. A negative result may occur with  improper specimen collection/handling, submission of specimen other than nasopharyngeal swab, presence of viral mutation(s) within the areas targeted by this assay, and inadequate number of viral copies(<138 copies/mL). A negative result must be combined with clinical observations, patient history, and epidemiological information. The expected result is Negative.  Fact Sheet for Patients:  EntrepreneurPulse.com.au  Fact Sheet for Healthcare Providers:  IncredibleEmployment.be  This test is no t yet approved or cleared by the [place] FDA and  has been authorized for detection and/or diagnosis of SARS-CoV-2 by FDA under an Emergency Use Authorization (EUA). This EUA will remain  in effect (meaning this test can be used) for the duration of the COVID-19 declaration under Section 564(b)(1) of the Act, 21 U.S.C.section 360bbb-3(b)(1), unless the authorization is terminated  or revoked sooner.       Influenza A by PCR NEGATIVE NEGATIVE Final   Influenza B by PCR NEGATIVE NEGATIVE Final    Comment: (NOTE) The Xpert Xpress SARS-CoV-2/FLU/RSV plus assay is intended as an aid in the diagnosis of influenza from Nasopharyngeal swab specimens and should not be used as a sole basis for treatment. Nasal washings and aspirates are unacceptable for Xpert Xpress SARS-CoV-2/FLU/RSV testing.  Fact Sheet for  Patients: EntrepreneurPulse.com.au  Fact Sheet for Healthcare Providers: IncredibleEmployment.be  This test is not yet approved or cleared by the [place] FDA and has been authorized for detection and/or diagnosis of SARS-CoV-2 by FDA under an Emergency Use Authorization (EUA). This EUA will remain in effect (meaning this test  can be used) for the duration of the COVID-19 declaration under Section 564(b)(1) of the Act, 21 U.S.C. section 360bbb-3(b)(1), unless the authorization is terminated or revoked.  Performed at [facility], [facility] [address] [place],  [address]       Radiology Studies: MR BRAIN WO CONTRAST  Result Date: 02/21/2021 CLINICAL DATA:  Stroke follow-up EXAM: MRI HEAD WITHOUT CONTRAST TECHNIQUE: Multiplanar, multiecho pulse sequences of the brain and surrounding structures were obtained without intravenous contrast. COMPARISON:  Head CT and CTA from yesterday FINDINGS: Brain: Tiny acute to subacute infarcts in the left anterior parietal cortex and centrum semiovale. Subacute timing is considered given the fairly weakly restricted diffusion, although history suggests acute timing. No hemorrhage, hydrocephalus, mass, or collection. Brain volume is normal. No visible prior infarction. Vascular: Normal flow voids. Skull and upper cervical spine: Normal marrow signal. Sinuses/Orbits: Negative. IMPRESSION: Small acute infarcts in the high left parietal cortex and left centrum semiovale. Electronically Signed   By: Jorje Guild M.D.   On: 02/21/2021 11:41   CT HEAD CODE STROKE WO CONTRAST  Result Date: 02/21/2021 CLINICAL DATA:  Code stroke. EXAM: CT HEAD WITHOUT CONTRAST TECHNIQUE: Contiguous axial images were obtained from the base of the skull through the vertex without intravenous contrast. RADIATION DOSE REDUCTION: This exam was performed according to the departmental dose-optimization program which includes automated  exposure control, adjustment of the mA and/or kV according to patient size and/or use of iterative reconstruction technique. COMPARISON:  None. FINDINGS: Brain: There is no mass, hemorrhage or extra-axial collection. The size and configuration of the ventricles and extra-axial CSF spaces are normal. The brain parenchyma is normal, without evidence of acute or chronic infarction. Vascular: No abnormal hyperdensity of the major intracranial arteries or dural venous sinuses. No intracranial atherosclerosis. Skull: The visualized skull base, calvarium and extracranial soft tissues are normal. Sinuses/Orbits: No fluid levels or advanced mucosal thickening of the visualized paranasal sinuses. No mastoid or middle ear effusion. The orbits are normal. ASPECTS [facility] Stroke Program Early CT Score) - Ganglionic level infarction (caudate, lentiform nuclei, internal capsule, insula, M1-M3 cortex): 7 - Supraganglionic infarction (M4-M6 cortex): 3 Total score (0-10 with 10 being normal): 10 IMPRESSION: 1. No acute intracranial abnormality. 2. ASPECTS is 10. These results were called by telephone at the time of interpretation on 02/21/2021 at 2:01 am to provider [facility] , who verbally acknowledged these results. Electronically Signed   By: Ulyses Jarred M.D.   On: 02/21/2021 02:01   CT ANGIO HEAD NECK W WO CM (CODE STROKE)  Result Date: 02/21/2021 CLINICAL DATA:  Code stroke.  Unilateral weakness. EXAM: CT ANGIOGRAPHY HEAD AND NECK TECHNIQUE: Multidetector CT imaging of the head and neck was performed using the standard protocol during bolus administration of intravenous contrast. Multiplanar CT image reconstructions and MIPs were obtained to evaluate the vascular anatomy. Carotid stenosis measurements (when applicable) are obtained utilizing NASCET criteria, using the distal internal carotid diameter as the denominator. RADIATION DOSE REDUCTION: This exam was performed according to the departmental  dose-optimization program which includes automated exposure control, adjustment of the mA and/or kV according to patient size and/or use of iterative reconstruction technique. CONTRAST:  46mL OMNIPAQUE IOHEXOL 350 MG/ML SOLN COMPARISON:  None. FINDINGS: CTA NECK FINDINGS SKELETON: There is no bony spinal canal stenosis. No lytic or blastic lesion. OTHER NECK: Normal pharynx, larynx and major salivary glands. No cervical lymphadenopathy. Unremarkable thyroid gland. UPPER CHEST: No pneumothorax or pleural effusion. No nodules or masses. AORTIC ARCH: There  is no calcific atherosclerosis of the aortic arch. There is no aneurysm, dissection or hemodynamically significant stenosis of the visualized portion of the aorta. Normal variant aortic arch branching pattern with the brachiocephalic and left common carotid arteries sharing a common origin. The visualized proximal subclavian arteries are widely patent. RIGHT CAROTID SYSTEM: Normal without aneurysm, dissection or stenosis. LEFT CAROTID SYSTEM: Normal without aneurysm, dissection or stenosis. VERTEBRAL ARTERIES: Left dominant configuration. Both origins are clearly patent. There is no dissection, occlusion or flow-limiting stenosis to the skull base (V1-V3 segments). CTA HEAD FINDINGS POSTERIOR CIRCULATION: --Vertebral arteries: Normal V4 segments. --Inferior cerebellar arteries: Normal. --Basilar artery: Normal. --Superior cerebellar arteries: Normal. --Posterior cerebral arteries (PCA): Normal. ANTERIOR CIRCULATION: --Intracranial internal carotid arteries: Normal. --Anterior cerebral arteries (ACA): Normal. Both A1 segments are present. Patent anterior communicating artery (a-comm). --Middle cerebral arteries (MCA): Normal. VENOUS SINUSES: As permitted by contrast timing, patent. ANATOMIC VARIANTS: None Review of the MIP images confirms the above findings. IMPRESSION: Normal CTA of the head and neck. Electronically Signed   By: Ulyses Jarred M.D.   On: 02/21/2021  02:12    Scheduled Meds:  amiodarone  400 mg Oral BID   apixaban  5 mg Oral BID   atorvastatin  40 mg Oral Daily   carvedilol  6.25 mg Oral BID WC   Chlorhexidine Gluconate Cloth  6 each Topical Daily   dapagliflozin propanediol  10 mg Oral Daily   digoxin  0.25 mg Oral Daily   folic acid  1 mg Oral Daily   multivitamin with minerals  1 tablet Oral Daily   pantoprazole  40 mg Oral Daily   sodium chloride flush  10-40 mL Intracatheter Q12H   sodium chloride flush  3 mL Intravenous Q12H   sodium chloride flush  3 mL Intravenous Q12H   spironolactone  12.5 mg Oral Daily   thiamine  100 mg Oral Daily   Continuous Infusions:  sodium chloride     sodium chloride       LOS: 9 days   Time spent: 35 minutes  Barb Merino, MD [facility]  02/22/2021, 11:38 AM

## 2021-02-22 NOTE — TOC CM/SW Note (Addendum)
HF TOC CM provided pt with [place] and Eliquis copay card. Pt has scale. Isidoro Donning RN3 CCM, Heart Failure TOC CM [phone]    HF TOC CM sent benefit check for Farxiga. Pt will receive initial Eliquis and Farxiga with 30 day free with trial cards.  Isidoro Donning RN3 CCM, Heart Failure TOC CM [phone]   Insurance verification completed.     The patient is currently admitted and upon discharge could be taking Eliquis 5 mg.   The current 30 day co-pay is, $516.14 due to a $826.13 deductible remaining.    The patient is insured through [employer] 25 copay

## 2021-02-22 NOTE — Plan of Care (Signed)
Problem: Nutrition: °Goal: Dietary intake will improve °Outcome: Completed/Met °  °Problem: Nutrition: °Goal: Risk of aspiration will decrease °Outcome: Completed/Met °  °Problem: Self-Care: °Goal: Ability to communicate needs accurately will improve °Outcome: Completed/Met °  °Problem: Self-Care: °Goal: Verbalization of feelings and concerns over difficulty with self-care will improve °Outcome: Completed/Met °  °Problem: Self-Care: °Goal: Ability to participate in self-care as condition permits will improve °Outcome: Completed/Met °  °

## 2021-02-22 NOTE — Telephone Encounter (Signed)
Pt is currently admitted to the hospital. Will wait to make any medication changes at this time.

## 2021-02-22 NOTE — Progress Notes (Addendum)
STROKE TEAM PROGRESS NOTE   INTERVAL HISTORY MRI showed small acute high left parietal cortex and left centrum semiovale.   Discussed importance of risk factor modifications as well as medication compliance. Discussed OSA symptoms and possible eligibility to SLEEP SMART study trial.  Neurological exam is unchanged.  Vital signs are stable. Vitals:   02/21/21 2100 02/22/21 0000 02/22/21 0419 02/22/21 0729  BP: (!) 116/93 (!) 111/94 110/89 (!) 121/93  Pulse: 93 72  86  Resp: 18 18 18 18   Temp: 98.3 F (36.8 C) 98.2 F (36.8 C) 97.8 F (36.6 C) 98.3 F (36.8 C)  TempSrc: Oral Oral Oral Oral  SpO2: 98% 98% 100% 100%  Weight:   100.5 kg   Height:       CBC:  Recent Labs  Lab 02/20/21 0422 02/21/21 0248  WBC 3.5* 3.6*  HGB 15.5 14.9  HCT 45.9 44.1  MCV 91.6 91.3  PLT 188 [id number]    Basic Metabolic Panel:  Recent Labs  Lab 02/21/21 0248 02/22/21 0504  NA 136 141  K 3.5 4.0  CL 100 105  CO2 27 28  GLUCOSE 111* 95  BUN 13 12  CREATININE 1.37* 1.45*  CALCIUM 8.2* 8.5*    Lipid Panel:  Recent Labs  Lab 02/21/21 0337  CHOL 152  TRIG 62  HDL 47  CHOLHDL 3.2  VLDL 12  LDLCALC 93    HgbA1c:  Recent Labs  Lab 02/21/21 0336  HGBA1C 5.6    Urine Drug Screen: No results for input(s): LABOPIA, COCAINSCRNUR, LABBENZ, AMPHETMU, THCU, LABBARB in the last 168 hours.  Alcohol Level No results for input(s): ETH in the last 168 hours.  IMAGING past 24 hours MR BRAIN WO CONTRAST  Result Date: 02/21/2021 CLINICAL DATA:  Stroke follow-up EXAM: MRI HEAD WITHOUT CONTRAST TECHNIQUE: Multiplanar, multiecho pulse sequences of the brain and surrounding structures were obtained without intravenous contrast. COMPARISON:  Head CT and CTA from yesterday FINDINGS: Brain: Tiny acute to subacute infarcts in the left anterior parietal cortex and centrum semiovale. Subacute timing is considered given the fairly weakly restricted diffusion, although history suggests acute timing. No  hemorrhage, hydrocephalus, mass, or collection. Brain volume is normal. No visible prior infarction. Vascular: Normal flow voids. Skull and upper cervical spine: Normal marrow signal. Sinuses/Orbits: Negative. IMPRESSION: Small acute infarcts in the high left parietal cortex and left centrum semiovale. Electronically Signed   By: Jorje Guild M.D.   On: 02/21/2021 11:41    PHYSICAL EXAM  Temp:  [97.8 F (36.6 C)-98.3 F (36.8 C)] 98.3 F (36.8 C) (01/30 0729) Pulse Rate:  [72-100] 86 (01/30 0729) Resp:  [18] 18 (01/30 0729) BP: (105-121)/(89-98) 121/93 (01/30 0729) SpO2:  [98 %-100 %] 100 % (01/30 0729) Weight:  [100.5 kg] 100.5 kg (01/30 0419)  General - Well nourished, well developed middle-aged male, in no apparent distress.  Cardiovascular - Tachycardic.  No murmur or gallop  Mental Status -  Level of arousal and orientation to time, place, and person were intact. Language including expression, naming, repetition, comprehension was assessed and found intact. Attention span and concentration were normal. Recent and remote memory were intact. Fund of Knowledge was assessed and was intact.  Cranial Nerves II - XII - II - Visual field intact OU. III, IV, VI - Extraocular movements intact. V - Facial sensation intact bilaterally. VII - Facial movement intact bilaterally. VIII - Hearing & vestibular intact bilaterally. X - Palate elevates symmetrically. XI - Chin turning & shoulder shrug intact bilaterally.  XII - Tongue protrusion intact.  Motor Strength - The patients strength was normal in all extremities and pronator drift was absent.  Bulk was normal and fasciculations were absent.   Motor Tone - Muscle tone was assessed at the neck and appendages and was normal.  Reflexes - The patients reflexes were symmetrical in all extremities and he had no pathological reflexes.  Sensory - Light touch, temperature/pinprick were assessed and were symmetrical.    Coordination -  The patient had normal movements in the hands and feet with no ataxia or dysmetria.  Tremor was absent.  Gait and Station - deferred.  NIHSS: 0   Premorbid modified Rankin scale 1  ASSESSMENT/PLAN [name] is a 56 y.o. male with history of CHF, A-fib on xarelto, AKI, lower extremity edema, initially admitted for a Rt/Lt heart cath and coronary angiography. A code stroke was called 1/29 at 1230am for a new onset of right side weakness.   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 ASPECTS 10.    CTA head & neck Normal CTA of the head and neck MRI  Small acute infarcts in the high left parietal cortex and left centrum semiovale.  2D Echo EF 20-25% TEE- EF 30-35%, positive bubble study, noted to have left atrial appendage clot LDL 93 HgbA1c 5.6 VTE prophylaxis - SCDs    Diet   Diet Heart Room service appropriate? Yes; Fluid consistency: Thin   Xarelto (rivaroxaban) daily (not taking) prior to admission, he was given dose of Xarelto yesterday. Therapy recommendations: None  disposition:  pending  Hypertension Home meds:  metoprolol Stable Permissive hypertension (OK if < 220/120) but gradually normalize in 5-7 days Long-term BP goal normotensive  Hyperlipidemia Home meds:  none LDL 93, goal < 70 Recommend adding statin  Continue statin at discharge  Other Stroke Risk Factors Smokeless tobacco ETOH use, advised to drink no more than 1-2 drink(s) a day Obesity, Body mass index is 32.72 kg/m., BMI >/= 30 associated with increased stroke risk, recommend weight loss, diet and exercise as appropriate  Congestive heart failure  Other Active Problems Afib w/ RVR Acute systolic CHF AKI Chronic Alcoholism Prediabetes  Hospital day # 9  Park Pope, MD PGY1 Resident  STROKE MD NOTE : I have personally obtained history,examined this patient, reviewed notes, independently viewed  imaging studies, participated in medical decision making and plan of care.ROS completed by me personally and pertinent positives fully documented  I have made any additions or clarifications directly to the above note. Agree with note above.  Patient with A. fib and cardiomyopathy with left atrial clot who had not been compliant with anticoagulation developed transient leg weakness and MRI scan shows embolic left MCA branch infarcts.  Agree with Eliquis 5 mg twice daily for anticoagulation if patient can afford it.  Aggressive risk factor modification.  Patient also appears to be at risk for sleep apnea and may benefit with possible consideration of participation in the sleep smart stroke prevention study.  He will be given written information to review and decide. Greater than 50% time during the 50 minute visit was spent in counseling and coordination of care about his embolic stroke and discussion about stroke prevention and sleep apnea and answering questions.  Discussed with Dr. Gala Romney and Dr.Ghimire Delia Heady, MD Medical Director [facility] Pager: [phone] 02/22/2021 3:06 PM   To contact Stroke Continuity provider,  please refer to [email or web address] After hours, contact General Neurology

## 2021-02-22 NOTE — TOC Benefit Eligibility Note (Signed)
Patient Teacher, English as a foreign language completed.    The patient is currently admitted and upon discharge could be taking Eliquis 5 mg.  The current 30 day co-pay is, $516.14 due to a $826.13 deductible remaining.   The patient is insured through [place], [place] Patient Advocate Specialist [place] Patient Advocate Team Direct Number: [phone]  Fax: [phone]

## 2021-02-23 ENCOUNTER — Encounter (HOSPITAL_COMMUNITY): Payer: 59

## 2021-02-23 ENCOUNTER — Other Ambulatory Visit (HOSPITAL_COMMUNITY): Payer: Self-pay

## 2021-02-23 DIAGNOSIS — I5021 Acute systolic (congestive) heart failure: Secondary | ICD-10-CM | POA: Diagnosis not present

## 2021-02-23 LAB — DIGOXIN LEVEL: Digoxin Level: 0.9 ng/mL (ref 0.8–2.0)

## 2021-02-23 LAB — BASIC METABOLIC PANEL
Anion gap: 7 (ref 5–15)
BUN: 13 mg/dL (ref 6–20)
CO2: 27 mmol/L (ref 22–32)
Calcium: 8.9 mg/dL (ref 8.9–10.3)
Chloride: 107 mmol/L (ref 98–111)
Creatinine, Ser: 1.51 mg/dL — ABNORMAL HIGH (ref 0.61–1.24)
GFR, Estimated: 55 mL/min — ABNORMAL LOW (ref >60)
Glucose, Bld: 88 mg/dL (ref 70–99)
Potassium: 4.4 mmol/L (ref 3.5–5.1)
Sodium: 141 mmol/L (ref 135–145)

## 2021-02-23 MED ORDER — AMIODARONE HCL 200 MG PO TABS
200.0000 mg | ORAL_TABLET | Freq: Two times a day (BID) | ORAL | 3 refills | Status: DC
  Filled 2021-02-23: qty 60, 30d supply, fill #0

## 2021-02-23 MED ORDER — TORSEMIDE 20 MG PO TABS
20.0000 mg | ORAL_TABLET | Freq: Every day | ORAL | 3 refills | Status: DC | PRN
Start: 2021-02-23 — End: 2021-07-29
  Filled 2021-02-23: qty 30, 30d supply, fill #0

## 2021-02-23 MED ORDER — DAPAGLIFLOZIN PROPANEDIOL 10 MG PO TABS
10.0000 mg | ORAL_TABLET | Freq: Every day | ORAL | 3 refills | Status: DC
  Filled 2021-02-23: qty 30, 30d supply, fill #0

## 2021-02-23 MED ORDER — DIGOXIN 125 MCG PO TABS
0.1250 mg | ORAL_TABLET | Freq: Every day | ORAL | 3 refills | Status: DC
  Filled 2021-02-23: qty 30, 30d supply, fill #0

## 2021-02-23 MED ORDER — APIXABAN 5 MG PO TABS
5.0000 mg | ORAL_TABLET | Freq: Two times a day (BID) | ORAL | 3 refills | Status: DC
Start: 2021-02-23 — End: 2021-07-29
  Filled 2021-02-23: qty 60, 30d supply, fill #0

## 2021-02-23 MED ORDER — ATORVASTATIN CALCIUM 40 MG PO TABS
40.0000 mg | ORAL_TABLET | Freq: Every day | ORAL | 3 refills | Status: DC
  Filled 2021-02-23: qty 30, 30d supply, fill #0

## 2021-02-23 MED ORDER — SPIRONOLACTONE 25 MG PO TABS
12.5000 mg | ORAL_TABLET | Freq: Every day | ORAL | 3 refills | Status: DC
  Filled 2021-02-23: qty 15, 30d supply, fill #0

## 2021-02-23 MED ORDER — AMIODARONE HCL 200 MG PO TABS: 200.0000 mg | ORAL_TABLET | Freq: Two times a day (BID) | ORAL | Status: DC

## 2021-02-23 MED ORDER — CARVEDILOL 6.25 MG PO TABS
6.2500 mg | ORAL_TABLET | Freq: Two times a day (BID) | ORAL | 3 refills | Status: DC
  Filled 2021-02-23: qty 60, 30d supply, fill #0

## 2021-02-23 MED ORDER — DIGOXIN 125 MCG PO TABS
0.1250 mg | ORAL_TABLET | Freq: Every day | ORAL | Status: DC
  Administered 2021-02-23: 0.125 mg via ORAL
  Filled 2021-02-23: qty 1

## 2021-02-23 NOTE — TOC Initial Note (Addendum)
Transition of Care [facility] - Initial/Assessment Note    Patient Details  Name: Eddie Dixon MRN: [id number] Date of Birth: January 04, 1966  Transition of [facility]) CM/SW Contact:    Elliot Cousin, RN Phone Number: 02/23/2021, 12:26 PM  Clinical Narrative:                 HF TOC CM spoke to pt and wife at bedside. Provided pt with copay cards for Eliquis and Farxiga. Wife requesting note for work. Attending made aware. Pt received meds from [facility] pharmacy.   Copay Farxiga $25  Expected Discharge Plan: Home/Self Care Barriers to Discharge: No Barriers Identified   Patient Goals and CMS Choice Patient states their goals for this hospitalization and ongoing recovery are:: wants go get back to baseline CMS Medicare.gov Compare Post Acute Care list provided to:: Patient    Expected Discharge Plan and Services Expected Discharge Plan: Home/Self Care   Discharge Planning Services: CM Consult   Living arrangements for the past 2 months: Single Family Home Expected Discharge Date: 02/23/21                 Prior Living Arrangements/Services Living arrangements for the past 2 months: Single Family Home Lives with:: Spouse Patient language and need for interpreter reviewed:: Yes        Need for Family Participation in Patient Care: No (Comment) Care giver support system in place?: No (comment)   Criminal Activity/Legal Involvement Pertinent to Current Situation/Hospitalization: No - Comment as needed  Activities of Daily Living Home Assistive Devices/Equipment: None ADL Screening (condition at time of admission) Patient's cognitive ability adequate to safely complete daily activities?: Yes Is the patient deaf or have difficulty hearing?: No Does the patient have difficulty seeing, even when wearing glasses/contacts?: No Does the patient have difficulty concentrating, remembering, or making decisions?: No Patient able to express need for assistance with ADLs?: Yes Does the  patient have difficulty dressing or bathing?: No Independently performs ADLs?: Yes (appropriate for developmental age) Does the patient have difficulty walking or climbing stairs?: No Weakness of Legs: Both Weakness of Arms/Hands: None  Permission Sought/Granted Permission sought to share information with : Case Manager, Family Supports, PCP Permission granted to share information with : Yes, Verbal Permission Granted  Share Information with NAME: Jeral Pinch     Permission granted to share info w Relationship: wife  Permission granted to share info w Contact Information: [phone]  Emotional Assessment Appearance:: Appears stated age Attitude/Demeanor/Rapport: Gracious Affect (typically observed): Accepting Orientation: : Oriented to Self, Oriented to Place, Oriented to  Time, Oriented to Situation   Psych Involvement: No (comment)  Admission diagnosis:  Acute systolic congestive heart failure (HCC) [I50.21] Atrial fibrillation with RVR (HCC) [I48.91] New onset of congestive heart failure (HCC) [I50.9] Patient Active Problem List   Diagnosis Date Noted   Cerebral thrombosis with cerebral infarction 02/21/2021   New onset of congestive heart failure (HCC) 02/13/2021   Acute HFrEF (heart failure with reduced ejection fraction) (HCC) 02/13/2021   AKI (acute kidney injury) (HCC) 02/13/2021   ETOH abuse 02/13/2021   Paresthesia 01/27/2021   Family history of early CAD 01/27/2021   Atrial fibrillation with RVR (HCC) 01/27/2021   Urinary tract infection 01/27/2021   Insomnia 11/16/2020   Exposure to STD 06/13/2017   Well adult exam 06/20/2012   Cerumen impaction 06/20/2012   ABNORMAL GLUCOSE NEC 02/01/2008   ANXIETY 04/05/2007   TOBACCO USE DISORDER/SMOKER-SMOKING CESSATION DISCUSSED 04/05/2007   ERECTILE DYSFUNCTION 12/01/2006   PCP:  Plotnikov, Georgina Quint, MD Pharmacy:   [facility] [address] [place] - [address] [place] [address] Phone: [phone] Fax: [phone]  [facility] [address] [place] [address] Phone: [phone] Fax: [phone]     Social Determinants of Health (SDOH) Interventions Food Insecurity Interventions: Intervention Not Indicated Financial Strain Interventions: Intervention Not Indicated Housing Interventions: Intervention Not Indicated Transportation Interventions: Intervention Not Indicated Alcohol Brief Interventions/Follow-up: Alcohol education/Brief advice  Readmission Risk Interventions No flowsheet data found.

## 2021-02-23 NOTE — Progress Notes (Signed)
CARDIAC REHAB PHASE I   Reinforced HF education with pt.Stressed importance of daily weights, along with monitoring fluid and salt intake. HF book at bedside. Encouraged continued ambulation without doing "too much".  Pt denies further questions or concerns at this time.  [id number] Reynold Bowen, RN BSN 02/23/2021 11:41 AM

## 2021-02-23 NOTE — Progress Notes (Addendum)
Advanced Heart Failure Rounding Note  PCP-Cardiologist: Nanetta Batty, MD   Subjective:   Admit weight 236--->222  1/25 Started on amio drip and heparin drip for A fib RVR. LAA thrombus noted. Not a candidate for DC-CV. Diuresing with IV lasix.  1/26 Diuresed with IV lasix. Negative 3.2 liters. Weight down another 6 pounds 1/27 cath minimal CAD, well compensated filling pressures and normal output. 1/29 RUE weakness resolved after 45 min.  Code Stroke called. CTA- head neck normal. CT head- no acute process. MRI- Small acute infarcts high left parietal cortex and left centrum semiovale. Placed on eliquis.   Wt down 4 lb. CVP 6-7   SBP 120 prior to AM meds. Was noted to have soft BPs yesterday.  SCr trending back up, 1.37>1.45>>1.51.  K 4.4   Dig level 0.9   Feels well today. Denies dyspnea. No CP. No orthostatic symptoms.   Objective:   Weight Range: 98.8 kg Body mass index is 32.17 kg/m.   Vital Signs:   Temp:  [97.7 F (36.5 C)-98.4 F (36.9 C)] 97.8 F (36.6 C) (01/31 0741) Pulse Rate:  [85-97] 97 (01/31 0741) Resp:  [16-18] 18 (01/31 0741) BP: (114-123)/(87-97) 122/94 (01/31 0741) SpO2:  [98 %-100 %] 100 % (01/31 0741) Weight:  [98.8 kg] 98.8 kg (01/31 0430) Last BM Date: 02/21/21  Weight change: Filed Weights   02/20/21 0352 02/22/21 0419 02/23/21 0430  Weight: 99.4 kg 100.5 kg 98.8 kg    Intake/Output:   Intake/Output Summary (Last 24 hours) at 02/23/2021 0906 Last data filed at 02/23/2021 0900 Gross per 24 hour  Intake 960 ml  Output 2400 ml  Net -1440 ml      Physical Exam   PHYSICAL EXAM: CVP 6-7  General:  Well appearing. No respiratory difficulty HEENT: normal Neck: supple. no JVD. Carotids 2+ bilat; no bruits. No lymphadenopathy or thyromegaly appreciated. Cor: PMI nondisplaced. Irregularly irregular rhythm and rate. No rubs, gallops or murmurs. Lungs: clear Abdomen: soft, nontender, nondistended. No hepatosplenomegaly. No bruits or  masses. Good bowel sounds. Extremities: no cyanosis, clubbing, rash, edema + RUE PICC  Neuro: alert & oriented x 3, cranial nerves grossly intact. moves all 4 extremities w/o difficulty. Affect pleasant.    Telemetry    A fib 70-90s   EKG    N/A  Labs    CBC Recent Labs    02/21/21 0248  WBC 3.6*  HGB 14.9  HCT 44.1  MCV 91.3  PLT 184   Basic Metabolic Panel Recent Labs    02/77/41 0504 02/23/21 0439  NA 141 141  K 4.0 4.4  CL 105 107  CO2 28 27  GLUCOSE 95 88  BUN 12 13  CREATININE 1.45* 1.51*  CALCIUM 8.5* 8.9   Liver Function Tests No results for input(s): AST, ALT, ALKPHOS, BILITOT, PROT, ALBUMIN in the last 72 hours. No results for input(s): LIPASE, AMYLASE in the last 72 hours. Cardiac Enzymes No results for input(s): CKTOTAL, CKMB, CKMBINDEX, TROPONINI in the last 72 hours.  BNP: BNP (last 3 results) Recent Labs    02/13/21 1533  BNP 1,340.6*    ProBNP (last 3 results) No results for input(s): PROBNP in the last 8760 hours.   D-Dimer No results for input(s): DDIMER in the last 72 hours. Hemoglobin A1C Recent Labs    02/21/21 0336  HGBA1C 5.6    Fasting Lipid Panel Recent Labs    02/21/21 0337  CHOL 152  HDL 47  LDLCALC 93  TRIG 62  CHOLHDL 3.2   Thyroid Function Tests No results for input(s): TSH, T4TOTAL, T3FREE, THYROIDAB in the last 72 hours.  Invalid input(s): FREET3  Other results:   Imaging    No results found.   Medications:     Scheduled Medications:  amiodarone  400 mg Oral BID   apixaban  5 mg Oral BID   atorvastatin  40 mg Oral Daily   carvedilol  6.25 mg Oral BID WC   Chlorhexidine Gluconate Cloth  6 each Topical Daily   dapagliflozin propanediol  10 mg Oral Daily   digoxin  0.25 mg Oral Daily   folic acid  1 mg Oral Daily   multivitamin with minerals  1 tablet Oral Daily   pantoprazole  40 mg Oral Daily   sodium chloride flush  10-40 mL Intracatheter Q12H   sodium chloride flush  3 mL  Intravenous Q12H   sodium chloride flush  3 mL Intravenous Q12H   spironolactone  12.5 mg Oral Daily   thiamine  100 mg Oral Daily    Infusions:  sodium chloride     sodium chloride      PRN Medications: sodium chloride, sodium chloride, acetaminophen, ondansetron (ZOFRAN) IV, sodium chloride flush, sodium chloride flush, sodium chloride flush, traZODone    Patient Profile   56 y.o. male with history of ETOH abuse and family hx premature CAD. Admitted with AF with RVR and acute systolic CHF (new).  Assessment/Plan   Acute systolic CHF: -Suspect tachymediated +- ETOH.  -Echo 02/14/21: EF 20-25%, RV moderately reduced, biatrial enlargement, mild to moderate MR, moderate TR -TEE 02/16/21: EF 30-35%, RV mildly reduced, LAA thrombus, possible  -1/27 cMRI EF 21% RV 25%. LAA thrombus. No scar. NICM Tachy mediated + ETOH.  - 1/27 Cath- minimal CAD, well compensated filling pressures and normal output.  - CVP 6-7. Volume status stable.  - Reduced digoxin to 0.125 mg daily (Dig level 0.9)  - Continue Coreg 6.25 mg bid  -No ARB/ARNi for now with soft BP and rising SCr  -Continue spiro 12.5 mg daily -Continue jardiance 10 mg daily.  -Torsemide 20 mg PRN at d/c -Will need f/u BMP in 1 wk     2. Atrial fibrillation with RVR: -Newly diagnosed earlier this month. In setting of ETOH use -LAA thrombus noted on TEE, DCCV not currently an option - Remains in A fib but rate better controlled.  - Reduce amio to 200 mg twice a day  - Off heparin drip and switched to eliquis 5 mg twice a day.  - Discussed importance of compliance with anticoagulants.  - will need outpatient sleep study  - Discussed ETOH cessation.   3. ETOH abuse: - Discussed importance of cessation.  -CIWA per [facility] -Consult HF CSW   4. Prediabetes: -A1c 5.6 today.    5. AKI: -In setting of acute CHF +/- contrast -Scr peaked at 1.8, 1.5 today (baseline 1.1) - No med titration today  - Needs repeat BMP in 1 wk     6. Right pleural effusion: -Moderate on CTA 01/21  7. LAA Clot  - eliquis 5 mg twice a day  - As above discussed   8. Stroke 1/29 RUE weakness--> Symptoms resolved after 45 minutes. resolved.  CTA- head neck normal CT head- no acute process MRI- Small acute infarcts high left parietal cortex and left centrum semiovale. HgbA1C 5.6  On eliquis 5 mg twice a day.   Ok for d/c home from HF standpoint. HF follow up 2/6 at  2:30. Appt info in AVS   Cardiac Meds for Discharge Amiodarone 200 mg bid  Eliquis 5 mg bid Atorvastatin 40 mg daily  Coreg 6.25 mg bid Farxiga 10 mg daily  Digoxin 0.125 mg daily  Spironolactone 12.5 mg daily  Torsemide 20 mg daily PRN (for wt gain > 3 lb in 24 hrs or 5 lb in 1 wk)   Length of Stay: [address] Sharol Harness, PA-C  02/23/2021, 9:06 AM  Advanced Heart Failure Team Pager [phone] (M-F; 7a - 5p)  Please contact [facility] for night-coverage after hours (5p -7a ) and weekends on [email or web address]  Patient seen and examined with the above-signed Advanced Practice Provider and/or Housestaff. I personally reviewed laboratory data, imaging studies and relevant notes. I independently examined the patient and formulated the important aspects of the plan. I have edited the note to reflect any of my changes or salient points. I have personally discussed the plan with the patient and/or family.  Feels well. No further stroke symptoms. Breathing ok. Anxious to go home   General:  Well appearing. No resp difficulty HEENT: normal Neck: supple. no JVD. Carotids 2+ bilat; no bruits. No lymphadenopathy or thryomegaly appreciated. Cor: PMI nondisplaced. Irregular rate & rhythm. No rubs, gallops or murmurs. Lungs: clear Abdomen: soft, nontender, nondistended. No hepatosplenomegaly. No bruits or masses. Good bowel sounds. Extremities: no cyanosis, clubbing, rash, edema Neuro: alert & orientedx3, cranial nerves grossly intact. moves all 4 extremities w/o difficulty.  Affect pleasant  He is stable for d/c today. Agree with above meds. Will arrange close f/u in HF Clinic   Arvilla Meres, MD  1:54 PM

## 2021-02-23 NOTE — Progress Notes (Signed)
Per Neurologist, patient cannot be discharge until they get sleep study results back. Attending MD and floor CN are aware.

## 2021-02-23 NOTE — Discharge Summary (Signed)
Physician Discharge Summary  Eddie Dixon [id number] DOB: [birth year] DOA: 02/13/2021  PCP: Cassandria Anger, MD  Admit date: 02/13/2021 Discharge date: 02/23/2021  Admitted From: Home  Disposition:  Home   Recommendations for Outpatient Follow-up:  Follow up with Dr. Alain Dixon PCP in 1 week Follow up with Cardiology Dr. Missy Dixon in 1 week Follow up with Neurology for stroke in 4-6 weeks Dr. Alain Dixon: Please obtain BMP/CBC in one week       Home Health: None  Equipment/Devices: None new  Discharge Condition: Stable  CODE STATUS: FULL Diet recommendation: Cardiac  Brief/Interim Summary: Eddie Dixon is a 56 y.o. M with recent dx Atrial fibrillation, also hx alcohol use who presented with weight gain, chest tightness/tachycardia and leg swelling.  In the ER, CTA chest showed pleural effusion, cardiomegaly.  Admitted and started on Lasix, amiodarone infusion.     PRINCIPAL HOSPITAL DIAGNOSIS: Acute on chronic systolic and diastolic CHF    Discharge Diagnoses:  Acute on chronic systolic and diastolic CHF Patient admitted with cardiomegaly, leg swelling, weight gain, and pleural effusion on chest imaging.  Started on Lasix, creatinine improved with decongestion.  Echocardiogram obtained shows EF 20 to 25%.  CHF team consulted, started Aldactone, Farxiga, [place].   Atrial fibrillation, paroxysmal with rapid ventricular rate Xarelto transition to Eliquis.  Discharged on new amiodarone.  New acute embolic stroke, left MCA On 1/29 while in the hospital, patient had acute onset right hemiplegia which resolved within an hour.  Code stroke called, tPA not given due to already on anticoagulation and symptoms resolving.  Neurology recommended transition to Eliquis, atorvastatin, neurology follow-up in 4 to 6 weeks. -Outpatient sleep study recommended  Alcohol use disorder Cessation recommended  Acute kidney injury Creatinine 1.8 on admission, improved to 1.3 on  discharge  Prediabetes A1c 6.1%  Obesity BMI 32          Discharge Instructions   Allergies as of 02/23/2021   No Known Allergies      Medication List     STOP taking these medications    metoprolol succinate 50 MG 24 hr tablet Commonly known as: TOPROL-XL   rivaroxaban 20 MG Tabs tablet Commonly known as: XARELTO   tadalafil 5 MG tablet Commonly known as: CIALIS       TAKE these medications    amiodarone 200 MG tablet Commonly known as: PACERONE Take 1 tablet (200 mg total) by mouth 2 (two) times daily.   atorvastatin 40 MG tablet Commonly known as: LIPITOR Take 1 tablet (40 mg total) by mouth daily. Start taking on: February 24, 2021   carvedilol 6.25 MG tablet Commonly known as: COREG Take 1 tablet (6.25 mg total) by mouth 2 (two) times daily with a meal.   digoxin 0.125 MG tablet Commonly known as: LANOXIN Take 1 tablet (0.125 mg total) by mouth daily. Start taking on: February 24, 2021   Eliquis 5 MG Tabs tablet Generic drug: apixaban Take 1 tablet (5 mg total) by mouth 2 (two) times daily.   Farxiga 10 MG Tabs tablet Generic drug: dapagliflozin propanediol Take 1 tablet (10 mg total) by mouth daily. Start taking on: February 24, 2021   Multivitamin Adults Tabs Take 1 tablet by mouth daily.   spironolactone 25 MG tablet Commonly known as: ALDACTONE Take 1/2 tablet (12.5 mg total) by mouth daily. Start taking on: February 24, 2021   torsemide 20 MG tablet Commonly known as: DEMADEX Take 1 tablet (20 mg total) by mouth daily as needed (for weight gain >  3 lb in 24 hrs or 5 lb over dry weight).   traZODone 50 MG tablet Commonly known as: DESYREL Take 0.5-1 tablets (25-50 mg total) by mouth at bedtime as needed for sleep.   Vitamin D3 50 MCG (2000 UT) capsule Take 1 capsule (2,000 Units total) by mouth daily.        Follow-up Information     [facility] Follow up on 03/01/2021.    Specialty: Cardiology Why: at 2:30 . Entrance C at [facility]. [facility]. Free Valet parking. Contact information: [address] [id number] [place] [phone]               Discharge Instructions     (HEART FAILURE PATIENTS) Call MD:  Anytime you have any of the following symptoms: 1) 3 pound weight gain in 24 hours or 5 pounds in 1 week 2) shortness of breath, with or without a dry hacking cough 3) swelling in the hands, feet or stomach 4) if you have to sleep on extra pillows at night in order to breathe.   Complete by: As directed    Ambulatory referral to Neurology   Complete by: As directed    An appointment is requested in approximately: 4 weeks   Avoid straining   Complete by: As directed    Diet - low sodium heart healthy   Complete by: As directed    Discharge instructions   Complete by: As directed    From Dr. Loleta Books: You were admitted for congestive heart failure. Here, you had a CAT scan of your chest that showed enlargement of the heart and some fluid around your lung from congestive heart failure.  You also had an MRI that showed heart enlargement, weak heart squeeze, and clot in the "left atrium" (this is what led to the stroke). The findings on MRI show that your weak heart is due to a combination of alcohol and atrial fibrillation. It is NECESSARY that you stop alcohol.  Even a small amount of alcohol will prevent your heart from healing, and will lead likely to death in the next few years, possibly.  You also did have a stroke while you were here. This was likely from some small bit of clot in the right atrium breaking off to float up to the brain.  Thankfully, this did not cause permanent disability.  If you stop taking the blood thinner Eliquis, though, another stroke will likely happen, and could be catastrophic.  Your medicines for going home are: Coreg 6.25 mg twice daily This is a beta blocker,  it is a heart protective medicine.    Farxiga 10 mg daily  This is another heart protective medicine, it reduces high blood sugar and helps keep extra fluid off.  In your case it is NOT for diabetes, but to protect the heart.  Spironolactone 12.5 mg daily  This is a diuretic (remove fluid) and also a hormone blocker, to protect the heart  Torsemide 20 mg daily only as needed This is a diuretic or "fluid pill" to make you pee off extra fluid Take this if you have weight gain MORE THAN 3 lbs in a day or 5 lbs over your "dry weight" (that' your weight today when you get home, after having all the excess fluid taken off)  Eliquis 5 mg twice daily  This is a blood thinner, to prevent the clots from forming in the heart again to cause  more strokes.  Take it from now on.  If you have any of the blood thinner Xarelto/rivaroxaban at home, throw it away, do not take both.  If you have blood in your bowel movements or any dark stools, like tar, call your doctor RIGHT AWAY  Atorvastatin 40 mg daily  This is a cholesterol medicine, it prevents from having high stroke risk  Amiodarone 200 mg twice daily  Digoxin 0.125 mg daily  These two medicines amiodarone and digoxine are to keep the heart in rhythm   Heart Failure patients record your daily weight using the same scale at the same time of day   Complete by: As directed    Increase activity slowly   Complete by: As directed    STOP any activity that causes chest pain, shortness of breath, dizziness, sweating, or exessive weakness   Complete by: As directed    Statin already ordered   Complete by: As directed         No Known Allergies  Consultations: Cardiology Neurology   Procedures/Studies: DG Chest 2 View  Result Date: 02/13/2021 CLINICAL DATA:  Atrial fibrillation EXAM: CHEST - 2 VIEW COMPARISON:  None. FINDINGS: Cardiomegaly noted without CHF. Increased basilar streaky opacities favored to be atelectasis. No definite focal  pneumonia, collapse or consolidation. No large effusion or pneumothorax. Trachea midline. Minor endplate degenerative changes of the spine. Monitor leads overlie the chest. IMPRESSION: Cardiomegaly and bibasilar atelectasis. Electronically Signed   By: Jerilynn Mages.  Shick M.D.   On: 02/13/2021 16:07   CT Angio Chest PE W and/or Wo Contrast  Result Date: 02/13/2021 CLINICAL DATA:  Pulmonary embolism (PE) suspected, positive D-dimer EXAM: CT ANGIOGRAPHY CHEST WITH CONTRAST TECHNIQUE: Multidetector CT imaging of the chest was performed using the standard protocol during bolus administration of intravenous contrast. Multiplanar CT image reconstructions and MIPs were obtained to evaluate the vascular anatomy. RADIATION DOSE REDUCTION: This exam was performed according to the departmental dose-optimization program which includes automated exposure control, adjustment of the mA and/or kV according to patient size and/or use of iterative reconstruction technique. CONTRAST:  66mL OMNIPAQUE IOHEXOL 350 MG/ML SOLN COMPARISON:  None. FINDINGS: Cardiovascular: Cardiomegaly. No filling defects in the pulmonary arteries to suggest pulmonary emboli. No aortic aneurysm. Mediastinum/Nodes: No mediastinal, hilar, or axillary adenopathy. Trachea and esophagus are unremarkable. Thyroid unremarkable. Lungs/Pleura: Moderate right pleural effusion. No confluent airspace opacities. Upper Abdomen: Imaging into the upper abdomen demonstrates no acute findings. Musculoskeletal: Chest wall soft tissues are unremarkable. No acute bony abnormality. Review of the MIP images confirms the above findings. IMPRESSION: Moderate right pleural effusion. Cardiomegaly. No evidence of pulmonary embolus. Electronically Signed   By: Rolm Baptise M.D.   On: 02/13/2021 17:34   MR BRAIN WO CONTRAST  Result Date: 02/21/2021 CLINICAL DATA:  Stroke follow-up EXAM: MRI HEAD WITHOUT CONTRAST TECHNIQUE: Multiplanar, multiecho pulse sequences of the brain and surrounding  structures were obtained without intravenous contrast. COMPARISON:  Head CT and CTA from yesterday FINDINGS: Brain: Tiny acute to subacute infarcts in the left anterior parietal cortex and centrum semiovale. Subacute timing is considered given the fairly weakly restricted diffusion, although history suggests acute timing. No hemorrhage, hydrocephalus, mass, or collection. Brain volume is normal. No visible prior infarction. Vascular: Normal flow voids. Skull and upper cervical spine: Normal marrow signal. Sinuses/Orbits: Negative. IMPRESSION: Small acute infarcts in the high left parietal cortex and left centrum semiovale. Electronically Signed   By: Jorje Guild M.D.   On: 02/21/2021 11:41   CARDIAC CATHETERIZATION  Result Date: 02/19/2021   Prox RCA lesion is 20% stenosed.   The left ventricular ejection fraction is less than 25% by visual estimate. Findings: Ao = 96/73 (83) LV = 97/11 RA = 6 RV = 31/7 PA = 30/5 (22) PCW = 17 Fick cardiac output/index = 6.4/3.0 PVR = 0.80 WU SVR = 960 Ao sat = 95% PA sat = 73%, 77% SVC sat = 73% Assessment: 1. Minimal nonobstructive CAD 2. Severe NICM EF ~20% 3. 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 Will get cMRI. Continue medical therapy. Glori Bickers, MD 2:13 PM  DG Chest Port 1 View  Result Date: 02/17/2021 CLINICAL DATA:  PICC placement EXAM: PORTABLE CHEST 1 VIEW COMPARISON:  Chest x-ray 02/13/2021 FINDINGS: Right PICC placement with the tip in the region of the SVC. Cardiomegaly and mediastinum appear unchanged. Pulmonary vasculature within normal limits. Mild likely subsegmental atelectasis at the left lung base. No pleural effusion or pneumothorax identified. IMPRESSION: Right PICC placement with the tip in the region of the SVC. No pneumothorax. Electronically Signed   By: Ofilia Neas M.D.   On: 02/17/2021 12:35   MR CARDIAC MORPHOLOGY W WO CONTRAST  Result Date: 02/19/2021 CLINICAL DATA:   Cardiomyopathy EXAM: CARDIAC MRI TECHNIQUE: The patient was scanned on a 1.5 Tesla Siemens magnet. A dedicated cardiac coil was used. Functional imaging was done using Fiesta sequences. 2,3, and 4 chamber views were done to assess for RWMA's. Modified Simpson's rule using a short axis stack was used to calculate an ejection fraction on a dedicated work Conservation officer, nature. The patient received 10 cc of Gadavist. After 10 minutes inversion recovery sequences were used to assess for infiltration and scar tissue. CONTRAST:  [id number] FINDINGS: Mild bi atrial enlargement. LAA thrombus present. Possible small PFO Normal ascending thoracic aorta 2.9 cm. Small pericardial effusion mostly posterior to the LV Moderate LVE with diffuse hypokinesis Quantitative EF 21% (EDV 220 cc ESV 174 cc SV 46 cc) No mural apical thrombus or defined RWMAls Delayed enhancement with no significant gadolinium uptake, scar, infarct or infiltrate. Moderate RVE with global hypokinesis RVEF 25% (EDV 201 cc ESV 151 cc SV 50 cc ) T1 1216 msec ECV 28% T2 48 msec IMPRESSION: 1.  Moderate LVE with global hypokinesis EF 21% 2. No significant delayed gadolinium uptake, scar, infarct, infiltration 3.  Normal parametric measures 4.  Moderate RVE with hypokinesis RVEF 25% 5.  LAA thrombus present 6.  Possible PFO 7.  Small pericardial effusion mostly posterior to LV 8.  Mild bi atrial enlargement 9.  Normal valves mild appearing MR 10. 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   On: 02/19/2021 19:30   ECHOCARDIOGRAM COMPLETE  Result Date: 02/14/2021    ECHOCARDIOGRAM REPORT   Patient Name:   Eddie Dixon Date of Exam: 02/14/2021 Medical Rec #:  [id number]      Height:       69.0 in Accession #:    [id number]     Weight:       236.0 lb Date of Birth:  04/04/1965     BSA:          2.217 m Patient Age:    56 years        BP:           120/90 mmHg Patient Gender: M  HR:           115 bpm. Exam Location:  Inpatient Procedure: 2D Echo, Cardiac Doppler, Color Doppler and Intracardiac            Opacification Agent Indications:    Atrial fibrillation with RVR  History:        Patient has no prior history of Echocardiogram examinations.                 ETOH abuse.  Sonographer:    Merrie Roof RDCS Referring Phys: [place]  1. Left ventricular ejection fraction, by estimation, is 20 to 25%. The left ventricle has severely decreased function. The left ventricle demonstrates global hypokinesis. There is mild left ventricular hypertrophy. Left ventricular diastolic parameters  are indeterminate.  2. Right ventricular systolic function is moderately reduced. The right ventricular size is mildly enlarged. There is normal pulmonary artery systolic pressure. The estimated right ventricular systolic pressure is Q000111Q mmHg.  3. Left atrial size was moderately dilated.  4. Right atrial size was severely dilated.  5. The mitral valve is normal in structure. Mild to moderate mitral valve regurgitation. No evidence of mitral stenosis.  6. Tricuspid valve regurgitation is moderate.  7. The aortic valve was not well visualized. Aortic valve regurgitation is not visualized. No aortic stenosis is present.  8. The inferior vena cava is dilated in size with >50% respiratory variability, suggesting right atrial pressure of 8 mmHg. FINDINGS  Left Ventricle: Left ventricular ejection fraction, by estimation, is 20 to 25%. The left ventricle has severely decreased function. The left ventricle demonstrates global hypokinesis. Definity contrast agent was given IV to delineate the left ventricular endocardial borders. The left ventricular internal cavity size was normal in size. There is mild left ventricular hypertrophy. Left ventricular diastolic parameters are indeterminate. Right Ventricle: The right ventricular size is  mildly enlarged. Right vetricular wall thickness was not well visualized. Right ventricular systolic function is moderately reduced. There is normal pulmonary artery systolic pressure. The tricuspid regurgitant velocity is 2.02 m/s, and with an assumed right atrial pressure of 8 mmHg, the estimated right ventricular systolic pressure is Q000111Q mmHg. Left Atrium: Left atrial size was moderately dilated. Right Atrium: Right atrial size was severely dilated. Pericardium: There is no evidence of pericardial effusion. Mitral Valve: The mitral valve is normal in structure. Mild to moderate mitral valve regurgitation. No evidence of mitral valve stenosis. Tricuspid Valve: The tricuspid valve is normal in structure. Tricuspid valve regurgitation is moderate. Aortic Valve: The aortic valve was not well visualized. Aortic valve regurgitation is not visualized. No aortic stenosis is present. Aortic valve mean gradient measures 2.0 mmHg. Aortic valve peak gradient measures 3.0 mmHg. Aortic valve area, by VTI measures 1.93 cm. Pulmonic Valve: The pulmonic valve was not well visualized. Pulmonic valve regurgitation is trivial. Aorta: The aortic root and ascending aorta are structurally normal, with no evidence of dilitation. Venous: The inferior vena cava is dilated in size with greater than 50% respiratory variability, suggesting right atrial pressure of 8 mmHg. IAS/Shunts: The interatrial septum was not well visualized.  LEFT VENTRICLE PLAX 2D LVIDd:         5.40 cm LVIDs:         4.70 cm LV PW:         1.20 cm LV IVS:        1.20 cm LVOT diam:     1.90 cm LV SV:         25  LV SV Index:   11 LVOT Area:     2.84 cm  RIGHT VENTRICLE            IVC RV Basal diam:  4.60 cm    IVC diam: 2.00 cm RV Mid diam:    3.80 cm RV S prime:     9.25 cm/s TAPSE (M-mode): 1.1 cm LEFT ATRIUM              Index        RIGHT ATRIUM           Index LA diam:        4.10 cm  1.85 cm/m   RA Area:     31.60 cm LA Vol (A2C):   111.0 ml 50.08 ml/m  RA  Volume:   115.00 ml 51.88 ml/m LA Vol (A4C):   82.0 ml  36.99 ml/m LA Biplane Vol: 99.4 ml  44.84 ml/m  AORTIC VALVE AV Area (Vmax):    1.86 cm AV Area (Vmean):   1.65 cm AV Area (VTI):     1.93 cm AV Vmax:           86.20 cm/s AV Vmean:          61.100 cm/s AV VTI:            0.128 m AV Peak Grad:      3.0 mmHg AV Mean Grad:      2.0 mmHg LVOT Vmax:         56.60 cm/s LVOT Vmean:        35.600 cm/s LVOT VTI:          0.087 m LVOT/AV VTI ratio: 0.68  AORTA Ao Root diam: 2.90 cm Ao Asc diam:  3.20 cm TRICUSPID VALVE TR Peak grad:   16.3 mmHg TR Vmax:        202.00 cm/s  SHUNTS Systemic VTI:  0.09 m Systemic Diam: 1.90 cm Epifanio Lesches MD Electronically signed by Epifanio Lesches MD Signature Date/Time: 02/14/2021/5:27:00 PM    Final    ECHO TEE  Result Date: 02/16/2021    TRANSESOPHOGEAL ECHO REPORT   Patient Name:   Eddie Dixon Date of Exam: 02/16/2021 Medical Rec #:  [id number]      Height:       69.0 in Accession #:    [id number]     Weight:       229.9 lb Date of Birth:  September 06, 1965     BSA:          2.192 m Patient Age:    55 years       BP:           103/65 mmHg Patient Gender: M              HR:           113 bpm. Exam Location:  Inpatient Procedure: Transesophageal Echo, Limited Color Doppler and Cardiac Doppler Indications:     atrial fibrillation  History:         Patient has prior history of Echocardiogram examinations, most                  recent 02/14/2021. CHF; Risk Factors:Family History of Coronary                  Artery Disease.  Sonographer:     Delcie Roch RDCS Referring Phys:  [id number] Diagnosing Phys: Thomasene Ripple DO PROCEDURE: After discussion of the risks  and benefits of a TEE, an informed consent was obtained from the patient. The transesophogeal probe was passed without difficulty through the esophogus of the patient. Imaged were obtained with the patient in a left lateral decubitus position. Local oropharyngeal anesthetic was provided with viscous  lidocaine. Sedation performed by different physician. The patient was monitored while under deep sedation. Anesthestetic sedation was provided intravenously by Anesthesiology: 280mg  of Propofol. The patient developed no complications during the procedure. IMPRESSIONS  1. Left ventricular ejection fraction, by estimation, is 30 to 35%. The left ventricle has moderately decreased function. The left ventricle demonstrates global hypokinesis. The left ventricular internal cavity size was mildly dilated.  2. Right ventricular systolic function is mildly reduced. The right ventricular size is normal.  3. Left atrial size was appears dilated visually. A left atrial/left atrial appendage thrombus was detected. The LAA emptying velocity was 22 cm/s.  4. Right atrial size was mildly dilated.  5. The mitral valve is grossly normal. Mild mitral valve regurgitation. No evidence of mitral stenosis.  6. Tricuspid valve regurgitation is mild to moderate.  7. The aortic valve is tricuspid. Aortic valve regurgitation is not visualized. No aortic stenosis is present.  8. Cannot exclude a small PFO. Agitated saline contrast bubble study was positive with shunting observed within 3-6 cardiac cycles suggestive of interatrial shunt. There is a patent foramen ovale with predominantly right to left shunting across the atrial septum. Conclusion(s)/Recommendation(s): Findings are concerning for an interatrial shunt as detailed above. Findings concerning for LA/LAA thrombus. Cardioversion was not performed. Would recommend 4 weeks of anticoagulation prior to further attempts at cardioversion. FINDINGS  Left Ventricle: Left ventricular ejection fraction, by estimation, is 30 to 35%. The left ventricle has moderately decreased function. The left ventricle demonstrates global hypokinesis. The left ventricular internal cavity size was mildly dilated. Right Ventricle: The right ventricular size is normal. No increase in right ventricular wall  thickness. Right ventricular systolic function is mildly reduced. Left Atrium: Left atrial size was appears dilated visually. A left atrial/left atrial appendage thrombus was detected. The LAA emptying velocity was 22 cm/s. Right Atrium: Right atrial size was mildly dilated. Pericardium: There is no evidence of pericardial effusion. Mitral Valve: The mitral valve is grossly normal. Mild mitral valve regurgitation. No evidence of mitral valve stenosis. Tricuspid Valve: The tricuspid valve is grossly normal. Tricuspid valve regurgitation is mild to moderate. Aortic Valve: The aortic valve is tricuspid. Aortic valve regurgitation is not visualized. No aortic stenosis is present. Pulmonic Valve: The pulmonic valve was not well visualized. Pulmonic valve regurgitation is not visualized. Aorta: The aortic root and ascending aorta are structurally normal, with no evidence of dilitation. There is minimal (Grade I) layered plaque involving the descending aorta and aortic arch. Venous: The left upper pulmonary vein, left lower pulmonary vein, right upper pulmonary vein and right lower pulmonary vein are normal. A normal flow pattern is recorded from the left upper pulmonary vein. IAS/Shunts: Cannot exclude a small PFO. Agitated saline contrast was given intravenously to evaluate for intracardiac shunting. Agitated saline contrast bubble study was positive with shunting observed within 3-6 cardiac cycles suggestive of interatrial shunt. Berniece Salines DO Electronically signed by Berniece Salines DO Signature Date/Time: 02/16/2021/3:11:24 PM    Final    CT HEAD CODE STROKE WO CONTRAST  Result Date: 02/21/2021 CLINICAL DATA:  Code stroke. EXAM: CT HEAD WITHOUT CONTRAST TECHNIQUE: Contiguous axial images were obtained from the base of the skull through the vertex without intravenous contrast. RADIATION DOSE REDUCTION:  This exam was performed according to the departmental dose-optimization program which includes automated exposure  control, adjustment of the mA and/or kV according to patient size and/or use of iterative reconstruction technique. COMPARISON:  None. FINDINGS: Brain: There is no mass, hemorrhage or extra-axial collection. The size and configuration of the ventricles and extra-axial CSF spaces are normal. The brain parenchyma is normal, without evidence of acute or chronic infarction. Vascular: No abnormal hyperdensity of the major intracranial arteries or dural venous sinuses. No intracranial atherosclerosis. Skull: The visualized skull base, calvarium and extracranial soft tissues are normal. Sinuses/Orbits: No fluid levels or advanced mucosal thickening of the visualized paranasal sinuses. No mastoid or middle ear effusion. The orbits are normal. ASPECTS [facility] Stroke Program Early CT Score) - Ganglionic level infarction (caudate, lentiform nuclei, internal capsule, insula, M1-M3 cortex): 7 - Supraganglionic infarction (M4-M6 cortex): 3 Total score (0-10 with 10 being normal): 10 IMPRESSION: 1. No acute intracranial abnormality. 2. ASPECTS is 10. These results were called by telephone at the time of interpretation on 02/21/2021 at 2:01 am to provider [facility] , who verbally acknowledged these results. Electronically Signed   By: Ulyses Jarred M.D.   On: 02/21/2021 02:01   [place] EKG SITE RITE  Result Date: 02/17/2021 If Site Rite image not attached, placement could not be confirmed due to current cardiac rhythm.  CT ANGIO HEAD NECK W WO CM (CODE STROKE)  Result Date: 02/21/2021 CLINICAL DATA:  Code stroke.  Unilateral weakness. EXAM: CT ANGIOGRAPHY HEAD AND NECK TECHNIQUE: Multidetector CT imaging of the head and neck was performed using the standard protocol during bolus administration of intravenous contrast. Multiplanar CT image reconstructions and MIPs were obtained to evaluate the vascular anatomy. Carotid stenosis measurements (when applicable) are obtained utilizing NASCET criteria, using the distal  internal carotid diameter as the denominator. RADIATION DOSE REDUCTION: This exam was performed according to the departmental dose-optimization program which includes automated exposure control, adjustment of the mA and/or kV according to patient size and/or use of iterative reconstruction technique. CONTRAST:  51mL OMNIPAQUE IOHEXOL 350 MG/ML SOLN COMPARISON:  None. FINDINGS: CTA NECK FINDINGS SKELETON: There is no bony spinal canal stenosis. No lytic or blastic lesion. OTHER NECK: Normal pharynx, larynx and major salivary glands. No cervical lymphadenopathy. Unremarkable thyroid gland. UPPER CHEST: No pneumothorax or pleural effusion. No nodules or masses. AORTIC ARCH: There is no calcific atherosclerosis of the aortic arch. There is no aneurysm, dissection or hemodynamically significant stenosis of the visualized portion of the aorta. Normal variant aortic arch branching pattern with the brachiocephalic and left common carotid arteries sharing a common origin. The visualized proximal subclavian arteries are widely patent. RIGHT CAROTID SYSTEM: Normal without aneurysm, dissection or stenosis. LEFT CAROTID SYSTEM: Normal without aneurysm, dissection or stenosis. VERTEBRAL ARTERIES: Left dominant configuration. Both origins are clearly patent. There is no dissection, occlusion or flow-limiting stenosis to the skull base (V1-V3 segments). CTA HEAD FINDINGS POSTERIOR CIRCULATION: --Vertebral arteries: Normal V4 segments. --Inferior cerebellar arteries: Normal. --Basilar artery: Normal. --Superior cerebellar arteries: Normal. --Posterior cerebral arteries (PCA): Normal. ANTERIOR CIRCULATION: --Intracranial internal carotid arteries: Normal. --Anterior cerebral arteries (ACA): Normal. Both A1 segments are present. Patent anterior communicating artery (a-comm). --Middle cerebral arteries (MCA): Normal. VENOUS SINUSES: As permitted by contrast timing, patent. ANATOMIC VARIANTS: None Review of the MIP images confirms the  above findings. IMPRESSION: Normal CTA of the head and neck. Electronically Signed   By: Ulyses Jarred M.D.   On: 02/21/2021 02:12       Discharge Exam:  Vitals:   02/23/21 0741 02/23/21 1000  BP: (!) 122/94 102/72  Pulse: 97   Resp: 18   Temp: 97.8 F (36.6 C)   SpO2: 100%    Vitals:   02/23/21 0010 02/23/21 0430 02/23/21 0741 02/23/21 1000  BP: (!) 121/97 (!) 114/94 (!) 122/94 102/72  Pulse: 85  97   Resp: 17 16 18    Temp: 98.1 F (36.7 C) 98.4 F (36.9 C) 97.8 F (36.6 C)   TempSrc: Oral Oral Oral   SpO2: 100% 98% 100%   Weight:  98.8 kg    Height:        General: Pt is alert, awake, not in acute distress Cardiovascular: RRR, nl S1-S2, no murmurs appreciated.   No LE edema.   Respiratory: Normal respiratory rate and rhythm.  CTAB without rales or wheezes. Abdominal: Abdomen soft and non-tender.  No distension or HSM.   Neuro/Psych: Strength symmetric in upper and lower extremities.  Judgment and insight appear normal.   The results of significant diagnostics from this hospitalization (including imaging, microbiology, ancillary and laboratory) are listed below for reference.     Microbiology: Recent Results (from the past 240 hour(s))  Resp Panel by RT-PCR (Flu A&B, Covid) Nasopharyngeal Swab     Status: None   Collection Time: 02/13/21  9:02 PM   Specimen: Nasopharyngeal Swab; Nasopharyngeal(NP) swabs in vial transport medium  Result Value Ref Range Status   SARS Coronavirus 2 by RT PCR NEGATIVE NEGATIVE Final    Comment: (NOTE) SARS-CoV-2 target nucleic acids are NOT DETECTED.  The SARS-CoV-2 RNA is generally detectable in upper respiratory specimens during the acute phase of infection. The lowest concentration of SARS-CoV-2 viral copies this assay can detect is 138 copies/mL. A negative result does not preclude SARS-Cov-2 infection and should not be used as the sole basis for treatment or other patient management decisions. A negative result may occur with   improper specimen collection/handling, submission of specimen other than nasopharyngeal swab, presence of viral mutation(s) within the areas targeted by this assay, and inadequate number of viral copies(<138 copies/mL). A negative result must be combined with clinical observations, patient history, and epidemiological information. The expected result is Negative.  Fact Sheet for Patients:  EntrepreneurPulse.com.au  Fact Sheet for Healthcare Providers:  IncredibleEmployment.be  This test is no t yet approved or cleared by the [place] FDA and  has been authorized for detection and/or diagnosis of SARS-CoV-2 by FDA under an Emergency Use Authorization (EUA). This EUA will remain  in effect (meaning this test can be used) for the duration of the COVID-19 declaration under Section 564(b)(1) of the Act, 21 U.S.C.section 360bbb-3(b)(1), unless the authorization is terminated  or revoked sooner.       Influenza A by PCR NEGATIVE NEGATIVE Final   Influenza B by PCR NEGATIVE NEGATIVE Final    Comment: (NOTE) The Xpert Xpress SARS-CoV-2/FLU/RSV plus assay is intended as an aid in the diagnosis of influenza from Nasopharyngeal swab specimens and should not be used as a sole basis for treatment. Nasal washings and aspirates are unacceptable for Xpert Xpress SARS-CoV-2/FLU/RSV testing.  Fact Sheet for Patients: EntrepreneurPulse.com.au  Fact Sheet for Healthcare Providers: IncredibleEmployment.be  This test is not yet approved or cleared by the [place] FDA and has been authorized for detection and/or diagnosis of SARS-CoV-2 by FDA under an Emergency Use Authorization (EUA). This EUA will remain in effect (meaning this test can be used) for the duration of the COVID-19 declaration under Section 564(b)(1) of  the Act, 21 U.S.C. section 360bbb-3(b)(1), unless the authorization is terminated  or revoked.  Performed at [facility], [place] [address], [place], [place] [address]      Labs: BNP (last 3 results) Recent Labs    02/13/21 1533  BNP [id number]*   Basic Metabolic Panel: Recent Labs  Lab 02/19/21 0505 02/19/21 1347 02/19/21 1400 02/20/21 0422 02/21/21 0248 02/22/21 0504 02/23/21 0439  NA 141   < > 144 140 136 141 141  K 3.6   < > 3.6 3.9 3.5 4.0 4.4  CL 104  --   --  101 100 105 107  CO2 30  --   --  28 27 28 27   GLUCOSE 98  --   --  193* 111* 95 88  BUN 18  --   --  15 13 12 13   CREATININE 1.44*  --   --  1.40* 1.37* 1.45* 1.51*  CALCIUM 8.6*  --   --  8.3* 8.2* 8.5* 8.9   < > = values in this interval not displayed.   Liver Function Tests: No results for input(s): AST, ALT, ALKPHOS, BILITOT, PROT, ALBUMIN in the last 168 hours. No results for input(s): LIPASE, AMYLASE in the last 168 hours. No results for input(s): AMMONIA in the last 168 hours. CBC: Recent Labs  Lab 02/18/21 0230 02/19/21 0505 02/19/21 1347 02/19/21 1353 02/19/21 1354 02/19/21 1400 02/20/21 0422 02/21/21 0248  WBC 4.3 4.0  --   --   --   --  3.5* 3.6*  HGB 15.2 15.0   < > 16.0 12.9* 15.0 15.5 14.9  HCT 45.7 46.1   < > 47.0 38.0* 44.0 45.9 44.1  MCV 92.9 93.3  --   --   --   --  91.6 91.3  PLT 218 215  --   --   --   --  188 184   < > = values in this interval not displayed.   Cardiac Enzymes: No results for input(s): CKTOTAL, CKMB, CKMBINDEX, TROPONINI in the last 168 hours. BNP: Invalid input(s): POCBNP CBG: Recent Labs  Lab 02/21/21 0125  GLUCAP 95   D-Dimer No results for input(s): DDIMER in the last 72 hours. Hgb A1c Recent Labs    02/21/21 0336  HGBA1C 5.6   Lipid Profile Recent Labs    02/21/21 0337  CHOL 152  HDL 47  LDLCALC 93  TRIG 62  CHOLHDL 3.2   Thyroid function studies No results for input(s): TSH, T4TOTAL, T3FREE, THYROIDAB in the last 72 hours.  Invalid input(s): FREET3 Anemia work up No results for input(s): VITAMINB12,  FOLATE, FERRITIN, TIBC, IRON, RETICCTPCT in the last 72 hours. Urinalysis    Component Value Date/Time   COLORURINE YELLOW 01/19/2021 0935   APPEARANCEUR CLEAR 01/19/2021 0935   LABSPEC 1.025 01/19/2021 0935   PHURINE 5.5 01/19/2021 0935   GLUCOSEU NEGATIVE 01/19/2021 0935   HGBUR NEGATIVE 01/19/2021 0935   BILIRUBINUR SMALL (A) 01/19/2021 0935   KETONESUR TRACE (A) 01/19/2021 0935   PROTEINUR NEGATIVE 02/23/2008 1401   UROBILINOGEN 0.2 01/19/2021 0935   NITRITE NEGATIVE 01/19/2021 0935   LEUKOCYTESUR SMALL (A) 01/19/2021 0935   Sepsis Labs Invalid input(s): PROCALCITONIN,  WBC,  LACTICIDVEN Microbiology Recent Results (from the past 240 hour(s))  Resp Panel by RT-PCR (Flu A&B, Covid) Nasopharyngeal Swab     Status: None   Collection Time: 02/13/21  9:02 PM   Specimen: Nasopharyngeal Swab; Nasopharyngeal(NP) swabs in vial transport medium  Result Value Ref  Range Status   SARS Coronavirus 2 by RT PCR NEGATIVE NEGATIVE Final    Comment: (NOTE) SARS-CoV-2 target nucleic acids are NOT DETECTED.  The SARS-CoV-2 RNA is generally detectable in upper respiratory specimens during the acute phase of infection. The lowest concentration of SARS-CoV-2 viral copies this assay can detect is 138 copies/mL. A negative result does not preclude SARS-Cov-2 infection and should not be used as the sole basis for treatment or other patient management decisions. A negative result may occur with  improper specimen collection/handling, submission of specimen other than nasopharyngeal swab, presence of viral mutation(s) within the areas targeted by this assay, and inadequate number of viral copies(<138 copies/mL). A negative result must be combined with clinical observations, patient history, and epidemiological information. The expected result is Negative.  Fact Sheet for Patients:  EntrepreneurPulse.com.au  Fact Sheet for Healthcare Providers:   IncredibleEmployment.be  This test is no t yet approved or cleared by the [place] FDA and  has been authorized for detection and/or diagnosis of SARS-CoV-2 by FDA under an Emergency Use Authorization (EUA). This EUA will remain  in effect (meaning this test can be used) for the duration of the COVID-19 declaration under Section 564(b)(1) of the Act, 21 U.S.C.section 360bbb-3(b)(1), unless the authorization is terminated  or revoked sooner.       Influenza A by PCR NEGATIVE NEGATIVE Final   Influenza B by PCR NEGATIVE NEGATIVE Final    Comment: (NOTE) The Xpert Xpress SARS-CoV-2/FLU/RSV plus assay is intended as an aid in the diagnosis of influenza from Nasopharyngeal swab specimens and should not be used as a sole basis for treatment. Nasal washings and aspirates are unacceptable for Xpert Xpress SARS-CoV-2/FLU/RSV testing.  Fact Sheet for Patients: EntrepreneurPulse.com.au  Fact Sheet for Healthcare Providers: IncredibleEmployment.be  This test is not yet approved or cleared by the [place] FDA and has been authorized for detection and/or diagnosis of SARS-CoV-2 by FDA under an Emergency Use Authorization (EUA). This EUA will remain in effect (meaning this test can be used) for the duration of the COVID-19 declaration under Section 564(b)(1) of the Act, 21 U.S.C. section 360bbb-3(b)(1), unless the authorization is terminated or revoked.  Performed at [facility], [place] [address] [place], [place] [address]      Time coordinating discharge: 45 minutes The [place] controlled substances registry was reviewed for this patient     30 Day Unplanned Readmission Risk Score    Flowsheet Row ED to Hosp-Admission (Current) from 02/13/2021 in [place] [facility]  30 Day Unplanned Readmission Risk Score (%) 12.01 Filed at 02/23/2021 0801       This score is the patient's risk of an unplanned  readmission within 30 days of being discharged (0 -100%). The score is based on dignosis, age, lab data, medications, orders, and past utilization.   Low:  0-14.9   Medium: 15-21.9   High: 22-29.9   Extreme: 30 and above            SIGNED:   Edwin Dada, MD  [facility] 02/23/2021, 10:42 AM

## 2021-02-23 NOTE — Progress Notes (Addendum)
STROKE TEAM PROGRESS NOTE   INTERVAL HISTORY Discussed eligibility for sleep smart trial and the he had screened failure so he will discharge today. Patient agreeable.   Neurological exam is unchanged.  Vital signs are stable. Patient did sign consent form to participate in the sleep smart stroke prevention study and did undergo NOx 3 monitor overnight however study had technical limitations and had to be repeated tonight but patient was unwilling to stay so he was a screen failure Vitals:   02/23/21 0010 02/23/21 0430 02/23/21 0741 02/23/21 1000  BP: (!) 121/97 (!) 114/94 (!) 122/94 102/72  Pulse: 85  97   Resp: 17 16 18    Temp: 98.1 F (36.7 C) 98.4 F (36.9 C) 97.8 F (36.6 C)   TempSrc: Oral Oral Oral   SpO2: 100% 98% 100%   Weight:  98.8 kg    Height:       CBC:  Recent Labs  Lab 02/20/21 0422 02/21/21 0248  WBC 3.5* 3.6*  HGB 15.5 14.9  HCT 45.9 44.1  MCV 91.6 91.3  PLT 188 [id number]    Basic Metabolic Panel:  Recent Labs  Lab 02/22/21 0504 02/23/21 0439  NA 141 141  K 4.0 4.4  CL 105 107  CO2 28 27  GLUCOSE 95 88  BUN 12 13  CREATININE 1.45* 1.51*  CALCIUM 8.5* 8.9    Lipid Panel:  Recent Labs  Lab 02/21/21 0337  CHOL 152  TRIG 62  HDL 47  CHOLHDL 3.2  VLDL 12  LDLCALC 93    HgbA1c:  Recent Labs  Lab 02/21/21 0336  HGBA1C 5.6    Urine Drug Screen: No results for input(s): LABOPIA, COCAINSCRNUR, LABBENZ, AMPHETMU, THCU, LABBARB in the last 168 hours.  Alcohol Level No results for input(s): ETH in the last 168 hours.  IMAGING past 24 hours No results found.  PHYSICAL EXAM  Temp:  [97.7 F (36.5 C)-98.4 F (36.9 C)] 97.8 F (36.6 C) (01/31 0741) Pulse Rate:  [85-97] 97 (01/31 0741) Resp:  [16-18] 18 (01/31 0741) BP: (102-123)/(72-97) 102/72 (01/31 1000) SpO2:  [98 %-100 %] 100 % (01/31 0741) Weight:  [98.8 kg] 98.8 kg (01/31 0430)  General - Well nourished, well developed middle-aged male, in no apparent distress.  Cardiovascular -  Tachycardic.  No murmur or gallop  Mental Status -  Level of arousal and orientation to time, place, and person were intact. Language including expression, naming, repetition, comprehension was assessed and found intact. Attention span and concentration were normal. Recent and remote memory were intact. Fund of Knowledge was assessed and was intact.  Cranial Nerves II - XII - II - Visual field intact OU. III, IV, VI - Extraocular movements intact. V - Facial sensation intact bilaterally. VII - Facial movement intact bilaterally. VIII - Hearing & vestibular intact bilaterally. X - Palate elevates symmetrically. XI - Chin turning & shoulder shrug intact bilaterally. XII - Tongue protrusion intact.  Motor Strength - The patients strength was normal in all extremities and pronator drift was absent.  Bulk was normal and fasciculations were absent.   Motor Tone - Muscle tone was assessed at the neck and appendages and was normal.  Reflexes - The patients reflexes were symmetrical in all extremities and he had no pathological reflexes.  Sensory - Light touch, temperature/pinprick were assessed and were symmetrical.    Coordination - The patient had normal movements in the hands and feet with no ataxia or dysmetria.  Tremor was absent.  Gait and Station -  deferred.  NIHSS: 0   Premorbid modified Rankin scale 1  ASSESSMENT/PLAN [name] is a 56 y.o. male with history of CHF, A-fib on xarelto, AKI, lower extremity edema, initially admitted for a Rt/Lt heart cath and coronary angiography. A code stroke was called 1/29 at 1230am for a new onset of right side weakness.   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 ASPECTS 10.    CTA head & neck Normal CTA of the head and neck MRI  Small acute infarcts in the high left parietal cortex and left centrum semiovale.  2D Echo EF 20-25% TEE- EF  30-35%, positive bubble study, noted to have left atrial appendage clot LDL 93 HgbA1c 5.6 VTE prophylaxis - SCDs    Diet   Diet Heart Room service appropriate? Yes; Fluid consistency: Thin   Xarelto (rivaroxaban) daily (not taking) prior to admission, he was given dose of Xarelto yesterday. Therapy recommendations: None  disposition:  d/c home today  Hypertension Home meds:  metoprolol Stable Permissive hypertension (OK if < 220/120) but gradually normalize in 5-7 days Long-term BP goal normotensive  Hyperlipidemia Home meds:  none LDL 93, goal < 70 Recommend adding statin  Continue statin at discharge  Other Stroke Risk Factors Smokeless tobacco ETOH use, advised to drink no more than 1-2 drink(s) a day Obesity, Body mass index is 32.17 kg/m., BMI >/= 30 associated with increased stroke risk, recommend weight loss, diet and exercise as appropriate  Congestive heart failure  Other Active Problems Afib w/ RVR Acute systolic CHF AKI Chronic Alcoholism Prediabetes  Hospital day # 10  France Ravens, MD PGY1 Resident I have personally obtained history,examined this patient, reviewed notes, independently viewed imaging studies, participated in medical decision making and plan of care.ROS completed by me personally and pertinent positives fully documented  I have made any additions or clarifications directly to the above note. Agree with note above.  Patient did participate in the sleep smart study but was a screen failure as overnight NOx 3 monitor was technically not of good quality and had to be repeated tonight with patient was unwilling to stay.  Recommend Eliquis for secondary stroke prevention and maintain aggressive risk factor modification.  Follow-up as an outpatient stroke clinic in 2 months.  Stroke team will sign off.  Kindly call for questions.  Greater than 50% time during this 35-minute visit was spent on counseling and coordination of care and discussion patient about  sleep apnea and stroke prevention and answering questions.  Discussed with Dr. Cleora Fleet, [place] Pager: [phone] 02/23/2021 4:19 PM    To contact Stroke Continuity provider, please refer to [email or web address] After hours, contact General Neurology

## 2021-02-24 ENCOUNTER — Telehealth: Payer: Self-pay

## 2021-02-24 NOTE — Telephone Encounter (Addendum)
Transition Care Management Unsuccessful Follow-up Telephone Call  Date of discharge and from where:  [place] 02-23-21 Dx: CHF  Attempts:  1st Attempt  Reason for unsuccessful TCM follow-up call:  Unable to leave message  Transition Care Management Unsuccessful Follow-up Telephone Call  Date of discharge and from where:  [place] 02-23-21 Dx: CHF  Attempts:  2nd Attempt  Reason for unsuccessful TCM follow-up call:  Left voice message  Transition Care Management Unsuccessful Follow-up Telephone Call  Date of discharge and from where:  [place] 02-23-21 Dx:  CHF  Attempts:  3rd Attempt  Reason for unsuccessful TCM follow-up call:  Left voice message

## 2021-02-26 NOTE — Progress Notes (Signed)
ADVANCED HF CLINIC CONSULT NOTE  Referring Physician: Primary Care: Plotnikov, Evie Lacks, MD HF Cardiologist: Dr. Haroldine Laws  HPI: Eddie Dixon is a 56 y.o.male with newly diagnosed CHF and Afib. Has hx of ETOH abuse. He established care with Dr. Gwenlyn Found on 01/28/21 for Afib with RVR diagnosed at PCP's office the day prior. Noted increasing dyspnea with exertion for several months. He was started on Xarelto and beta blocker.    Admitted 1/23 with a/c CHF and with AF with RVR. Beside echo with LVEF 20-25%. Started on IV lasix and carvedilol. Initially added amiodarone gtt but later stopped d/t concern for chemical conversion with nonadherence with anticoagulation. Formal echo showed EF 20-25%, RV moderately reduced, biatrial enlargement, mild to moderate MR, moderate TR. TEE showed LAA thrombus so unable to proceed with DCCV. BP were soft with sub-optimal UOP, and AHF consulted d/t concern for low-output CHF and to assess for advanced therapies. PICC placed, Co-Ox monitored. Underwent R/LHC showing minimal CAD, well compensated filling pressures and normal output. 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 cMRI showed EF 21% RV 25%. LAA thrombus. No scar. Had RUE weakness, [facility] Stroke called. CTA head and neck normal, MRI with small acute infarcts and he was placed on Eliquis. GDMT titrated and he was discharged home, weight 222 lbs.   Today he returns for post hospital HF follow up. Overall feeling fine. 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 Denies palpitations, abnormal bleeding, CP, dizziness, edema, or PND/Orthopnea. Appetite ok. No fever or chills. Weight at home 220 pounds. Taking all medications. Drinks 2 beers/day, has not had any bourbon since hospitalization. Chews tobacco.  Has not taken any torsemide.  Cardiac Studies: - Echo (1/23): EF 20-25%, RV moderately reduced, biatrial enlargement, mild to moderate MR, moderate TR  - TEE (1/23): EF 30-35%,  RV mildly reduced, LAA thrombus, possible   - cMRI (1/23): LVEF 21% RV 25%. LAA thrombus. No scar. NICM Tachy mediated + ETOH.   - R/LHC (1/23):    Prox RCA lesion is 20% stenosed.   The left ventricular ejection fraction is less than 25% by visual estimate.   Ao = 96/73 (83) LV = 97/11 RA = 6 RV = 31/7 PA = 30/5 (22) PCW = 17 Fick cardiac output/index = 6.4/3.0 PVR = 0.80 WU SVR = 960 Ao sat = 95%  PA sat = 73%, 77%  SVC sat = 73% Assessment: 1. Minimal nonobstructive CAD 2. Severe NICM EF ~20% 3. 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 Will get cMRI. Continue medical therap  Review of Systems: [y] = yes, [ ]  = no   General: Weight gain [ ] ; Weight loss [ ] ; Anorexia [ ] ; Fatigue [ ] ; Fever [ ] ; Chills [ ] ; Weakness [ ]   Cardiac: Chest pain/pressure [ ] ; Resting SOB [ ] ; Exertional SOB [ y]; Orthopnea [ ] ; Pedal Edema [ ] ; Palpitations [ ] ; Syncope [ ] ; Presyncope [ ] ; Paroxysmal nocturnal dyspnea[ ]   Pulmonary: Cough [ ] ; Wheezing[ ] ; Hemoptysis[ ] ; Sputum [ ] ; Snoring [y]  GI: Vomiting[ ] ; Dysphagia[ ] ; Melena[ ] ; Hematochezia [ ] ; Heartburn[ ] ; Abdominal pain [ ] ; Constipation [ ] ; Diarrhea [ ] ; BRBPR [ ]   GU: Hematuria[ ] ; Dysuria [ ] ; Nocturia[ ]   Vascular: Pain in legs with walking [ ] ; Pain in feet with lying flat [ ] ; Non-healing sores [ ] ; Stroke [y]; TIA [ ] ; Slurred speech [ ] ;  Neuro: Headaches[ ] ;  Vertigo[ ] ; Seizures[ ] ; Paresthesias[ ] ;Blurred vision [ ] ; Diplopia [ ] ; Vision changes [ ]   Ortho/Skin: Arthritis [ ] ; Joint pain [ ] ; Muscle pain [ ] ; Joint swelling [ ] ; Back Pain [ ] ; Rash [ ]   Psych: Depression[ ] ; Anxiety[ ]   Heme: Bleeding problems [ ] ; Clotting disorders [ ] ; Anemia [ ]   Endocrine: Diabetes [ ] ; Thyroid dysfunction[ ]   Past Medical History:  Diagnosis Date   A-fib (HCC)    Anxiety    ED (erectile dysfunction)    Elevated blood pressure    Current Outpatient Medications   Medication Sig Dispense Refill   amiodarone (PACERONE) 200 MG tablet Take 1 tablet (200 mg total) by mouth 2 (two) times daily. 60 tablet 3   apixaban (ELIQUIS) 5 MG TABS tablet Take 1 tablet (5 mg total) by mouth 2 (two) times daily. 60 tablet 3   atorvastatin (LIPITOR) 40 MG tablet Take 1 tablet (40 mg total) by mouth daily. 30 tablet 3   carvedilol (COREG) 6.25 MG tablet Take 1 tablet (6.25 mg total) by mouth 2 (two) times daily with a meal. 60 tablet 3   Cholecalciferol (VITAMIN D3) 50 MCG (2000 UT) capsule Take 1 capsule (2,000 Units total) by mouth daily. 100 capsule 3   dapagliflozin propanediol (FARXIGA) 10 MG TABS tablet Take 1 tablet (10 mg total) by mouth daily. 30 tablet 3   digoxin (LANOXIN) 0.125 MG tablet Take 1 tablet (0.125 mg total) by mouth daily. 30 tablet 3   Multiple Vitamins-Minerals (MULTIVITAMIN ADULTS) TABS Take 1 tablet by mouth daily.     spironolactone (ALDACTONE) 25 MG tablet Take 1/2 tablet (12.5 mg total) by mouth daily. 15 tablet 3   torsemide (DEMADEX) 20 MG tablet Take 1 tablet (20 mg total) by mouth daily as needed (for weight gain > 3 lb in 24 hrs or 5 lb over dry weight). 30 tablet 3   traZODone (DESYREL) 50 MG tablet Take 0.5-1 tablets (25-50 mg total) by mouth at bedtime as needed for sleep. 30 tablet 5   No current facility-administered medications for this encounter.   No Known Allergies  Social History   Socioeconomic History   Marital status: Divorced    Spouse name: Not on file   Number of children: 2   Years of education: Not on file   Highest education level: Some college, no degree  Occupational History   Occupation: Merchandiser, retail    Comment: [place] [employer]  Tobacco Use   Smoking status: Never   Smokeless tobacco: Current    Types: Chew  Vaping Use   Vaping Use: Never used  Substance and Sexual Activity   Alcohol use: Yes    Alcohol/week: 17.0 standard drinks    Types: 17 Shots of liquor per week    Comment: drinks fifth of liquor per week  x 1 year.   Drug use: No   Sexual activity: Yes  Other Topics Concern   Not on file  Social History Narrative   Regular Exercise -  YES         Social Determinants of Health   Financial Resource Strain: High Risk   Difficulty of Paying Living Expenses: Hard  Food Insecurity: No Food Insecurity   Worried About Running Out of Food in the Last Year: Never true   Ran Out of Food in the Last Year: Never true  Transportation Needs: No Transportation Needs   Lack of Transportation (Medical): No   Lack of Transportation (Non-Medical): No  Physical  Activity: Not on file  Stress: Not on file  Social Connections: Not on file  Intimate Partner Violence: Not on file   Family History  Problem Relation Age of Onset   Heart disease Father 71       MI   BP 116/90    Pulse 82    Wt 100.7 kg    SpO2 98%    BMI 32.78 kg/m   Wt Readings from Last 3 Encounters:  03/01/21 100.7 kg  02/23/21 98.8 kg  01/28/21 107 kg   PHYSICAL EXAM: General:  NAD. No resp difficulty HEENT: Normal Neck: Supple. No JVD. Carotids 2+ bilat; no bruits. No lymphadenopathy or thryomegaly appreciated. Cor: PMI nondisplaced. Irregular rate & rhythm. No rubs, gallops or murmurs. Lungs: Clear Abdomen: Soft, nontender, nondistended. No hepatosplenomegaly. No bruits or masses. Good bowel sounds. Extremities: No cyanosis, clubbing, rash, edema Neuro: Alert & oriented x 3, cranial nerves grossly intact. Moves all 4 extremities w/o difficulty. Affect pleasant.  ECG: atrial flutter 83 bpm (personally reviewed)  ASSESSMENT & PLAN: Chronic systolic CHF: - Suspect tachymediated +- ETOH.  - Echo (1/23): EF 20-25%, RV moderately reduced, biatrial enlargement, mild to moderate MR, moderate TR - TEE (1/23): EF 30-35%, RV mildly reduced, LAA thrombus, possible  - cMRI (1/23): LVEF 21% RV 25%. LAA thrombus. No scar. NICM Tachy mediated + ETOH.  - R/LHC (1/23): minimal CAD, well compensated filling pressures and normal output.   - Improved NYHA II, volume looks good today. He does not need daily loops. - Start losartan 12.5 mg daily. - Continue torsemide 20 mg daily PRN. - Continue digoxin 0.125 mg daily. - Continue Coreg 6.25 mg bid.  - Continue spiro 12.5 mg daily - Continue Farxiga 10 mg daily.  - BMET and dig level today; repeat BMET in 10 days.   2. Atrial fibrillation  - Newly diagnosed earlier this month. In setting of ETOH use. - LAA thrombus noted on TEE, DCCV deferred. - Remains in AF but rate controlled on ECG today. - Continue amiodarone 200 mg bid. Discussed with Dr. Haroldine Laws.  - Continue Eliquis 5 mg bid. He has not missed any doses. - Discussed importance of compliance with anticoagulants.  - Arrange for sleep study. - Discussed ETOH cessation.  - Arrange for DCCV with Dr. Haroldine Laws in 5 weeks.    3. ETOH abuse - Discussed importance of cessation.  - He has cut down some.   4. Prediabetes - Last A1c 5.6. - Continue SGLT2i.   5. h/o AKI: - In setting of acute CHF +/- contrast - Labs today.   6. Right pleural effusion - Moderate on CTA 02/13/21 - Lungs clear on exam today.   7. LAA Clot  - Continue Eliquis 5 mg bid. - As above discussed.   8. Stroke - MRI (1/23) new left MCA embolic - No deficits. - On Eliquis. - He needs Neurology follow up.   Arrange for DCCV with Dr. Haroldine Laws in 5 weeks. Continue current dose of amiodarone. Follow up with APP 2 weeks after cardioversion.    Allena Katz, FNP-BC 03/01/21

## 2021-03-01 ENCOUNTER — Encounter (HOSPITAL_COMMUNITY): Payer: Self-pay

## 2021-03-01 ENCOUNTER — Ambulatory Visit (HOSPITAL_COMMUNITY): Admit: 2021-03-01 | Payer: 59 | Admitting: Internal Medicine

## 2021-03-01 ENCOUNTER — Other Ambulatory Visit: Payer: Self-pay

## 2021-03-01 ENCOUNTER — Encounter (HOSPITAL_COMMUNITY): Payer: 59

## 2021-03-01 ENCOUNTER — Ambulatory Visit (HOSPITAL_COMMUNITY)
Admit: 2021-03-01 | Discharge: 2021-03-01 | Disposition: A | Discharge status: Home / Self Care | Payer: Managed Care, Other (non HMO) | Attending: Family Medicine | Admitting: Family Medicine

## 2021-03-01 ENCOUNTER — Other Ambulatory Visit (HOSPITAL_COMMUNITY): Payer: Self-pay

## 2021-03-01 VITALS — BP 116/90 | HR 82 | Wt 222.0 lb

## 2021-03-01 DIAGNOSIS — R7303 Prediabetes: Secondary | ICD-10-CM | POA: Insufficient documentation

## 2021-03-01 DIAGNOSIS — Z7901 Long term (current) use of anticoagulants: Secondary | ICD-10-CM | POA: Insufficient documentation

## 2021-03-01 DIAGNOSIS — I5022 Chronic systolic (congestive) heart failure: Secondary | ICD-10-CM | POA: Diagnosis present

## 2021-03-01 DIAGNOSIS — I4891 Unspecified atrial fibrillation: Secondary | ICD-10-CM | POA: Insufficient documentation

## 2021-03-01 DIAGNOSIS — I509 Heart failure, unspecified: Secondary | ICD-10-CM | POA: Diagnosis not present

## 2021-03-01 DIAGNOSIS — J90 Pleural effusion, not elsewhere classified: Secondary | ICD-10-CM | POA: Diagnosis not present

## 2021-03-01 DIAGNOSIS — I4892 Unspecified atrial flutter: Secondary | ICD-10-CM

## 2021-03-01 DIAGNOSIS — I513 Intracardiac thrombosis, not elsewhere classified: Secondary | ICD-10-CM

## 2021-03-01 DIAGNOSIS — Z79899 Other long term (current) drug therapy: Secondary | ICD-10-CM | POA: Insufficient documentation

## 2021-03-01 DIAGNOSIS — N179 Acute kidney failure, unspecified: Secondary | ICD-10-CM | POA: Insufficient documentation

## 2021-03-01 DIAGNOSIS — R7989 Other specified abnormal findings of blood chemistry: Secondary | ICD-10-CM

## 2021-03-01 DIAGNOSIS — Z8709 Personal history of other diseases of the respiratory system: Secondary | ICD-10-CM

## 2021-03-01 DIAGNOSIS — Z8673 Personal history of transient ischemic attack (TIA), and cerebral infarction without residual deficits: Secondary | ICD-10-CM | POA: Insufficient documentation

## 2021-03-01 DIAGNOSIS — F1010 Alcohol abuse, uncomplicated: Secondary | ICD-10-CM | POA: Insufficient documentation

## 2021-03-01 DIAGNOSIS — I63419 Cerebral infarction due to embolism of unspecified middle cerebral artery: Secondary | ICD-10-CM

## 2021-03-01 LAB — BASIC METABOLIC PANEL
Anion gap: 10 (ref 5–15)
BUN: 18 mg/dL (ref 6–20)
CO2: 26 mmol/L (ref 22–32)
Calcium: 9.7 mg/dL (ref 8.9–10.3)
Chloride: 104 mmol/L (ref 98–111)
Creatinine, Ser: 1.36 mg/dL — ABNORMAL HIGH (ref 0.61–1.24)
GFR, Estimated: >60 mL/min (ref >60)
Glucose, Bld: 93 mg/dL (ref 70–99)
Potassium: 4.9 mmol/L (ref 3.5–5.1)
Sodium: 140 mmol/L (ref 135–145)

## 2021-03-01 LAB — CBC
HCT: 50.1 % (ref 39.0–52.0)
Hemoglobin: 16.9 g/dL (ref 13.0–17.0)
MCH: 30.5 pg (ref 26.0–34.0)
MCHC: 33.7 g/dL (ref 30.0–36.0)
MCV: 90.4 fL (ref 80.0–100.0)
Platelets: 215 10*3/uL (ref 150–400)
RBC: 5.54 MIL/uL (ref 4.22–5.81)
RDW: 11.4 % — ABNORMAL LOW (ref 11.5–15.5)
WBC: 5.1 10*3/uL (ref 4.0–10.5)
nRBC: 0 % (ref 0.0–0.2)

## 2021-03-01 LAB — DIGOXIN LEVEL: Digoxin Level: 0.9 ng/mL (ref 0.8–2.0)

## 2021-03-01 SURGERY — CARDIOVERSION
Anesthesia: General

## 2021-03-01 MED ORDER — LOSARTAN POTASSIUM 25 MG PO TABS
12.5000 mg | ORAL_TABLET | Freq: Every day | ORAL | 1 refills | Status: DC
  Filled 2021-03-01 – 2021-03-26 (×2): qty 45, 90d supply, fill #0

## 2021-03-01 NOTE — Telephone Encounter (Signed)
Patient was seen in the hospital.

## 2021-03-01 NOTE — Patient Instructions (Signed)
Thank you for coming in today  Labs were done today, if any labs are abnormal the clinic will call you  You have been referred for a sleep study, they will call you to make appointment  START Losartan 12.5 mg 1/2 tablet daily  Your physician recommends that you schedule a follow-up appointment in:  2 weeks after cardioversion in clinic 12 week with echocardiogram with Dr. Gala Romney  Your physician has requested that you have an echocardiogram. Echocardiography is a painless test that uses sound waves to create images of your heart. It provides your doctor with information about the size and shape of your heart and how well your hearts chambers and valves are working. This procedure takes approximately one hour. There are no restrictions for this procedure.   You are scheduled for a TEE/Cardioversion/TEE Cardioversion on 03/26/2021 with Dr. Gala Romney.  Please arrive at the [facility] (Main Entrance A) at [facility]: [address] [place], [place] [address] at 0630am (1 hour prior to procedure unless lab work is needed; if lab work is needed arrive 1.5 hours ahead)  DIET: Nothing to eat or drink after midnight except a sip of water with medications (see medication instructions below)  Medication Instructions:   Continue your anticoagulant:  You will need to continue your anticoagulant after your procedure until you  are told by your  Provider that it is safe to stop   Labs: If patient is on Coumadin, patient needs pt/INR, CBC, BMET within 3 days (No pt/INR needed for patients taking Xarelto, Eliquis, Pradaxa) For patients receiving anesthesia for TEE and all Cardioversion patients: BMET, CBC within 1 week  Pre procedure labs done appointment 03/01/2021  You must have a responsible person to drive you home and stay in the waiting area during your procedure. Failure to do so could result in cancellation.  Bring your insurance cards.  *Special Note: Every effort is made to  have your procedure done on time. Occasionally there are emergencies that occur at the hospital that may cause delays. Please be patient if a delay does occur.     At the [facility], you and your health needs are our priority. As part of our continuing mission to provide you with exceptional heart care, we have created designated Provider Care Teams. These Care Teams include your primary Cardiologist (physician) and Advanced Practice Providers (APPs- Physician Assistants and Nurse Practitioners) who all work together to provide you with the care you need, when you need it.   You may see any of the following providers on your designated Care Team at your next follow up: Dr Arvilla Meres Dr Carron Curie, NP Robbie Lis, [place] [facility] [place], [place] Karle Plumber, PharmD   Please be sure to bring in all your medications bottles to every appointment.   If you have any questions or concerns before your next appointment please send [place] a message through [place] or call our office at [phone].    TO LEAVE A MESSAGE FOR THE NURSE SELECT OPTION 2, PLEASE LEAVE A MESSAGE INCLUDING: YOUR NAME DATE OF BIRTH CALL BACK NUMBER REASON FOR CALL**this is important as we prioritize the call backs  YOU WILL RECEIVE A CALL BACK THE SAME DAY AS LONG AS YOU CALL BEFORE 4:00 PM

## 2021-03-02 ENCOUNTER — Inpatient Hospital Stay: Payer: 59 | Admitting: Internal Medicine

## 2021-03-03 ENCOUNTER — Telehealth (HOSPITAL_COMMUNITY): Payer: Self-pay | Admitting: Surgery

## 2021-03-03 ENCOUNTER — Other Ambulatory Visit (HOSPITAL_COMMUNITY): Payer: Self-pay | Admitting: Surgery

## 2021-03-03 DIAGNOSIS — I5022 Chronic systolic (congestive) heart failure: Secondary | ICD-10-CM

## 2021-03-03 MED ORDER — DIGOXIN 125 MCG PO TABS: 0.0625 mg | ORAL_TABLET | Freq: Every day | ORAL | 3 refills | Status: DC

## 2021-03-03 NOTE — Telephone Encounter (Signed)
Patient called back to review results and recommendations per Eddie Rome NP.  He is aware of changes and I will update medication list CHL.

## 2021-03-03 NOTE — Progress Notes (Signed)
Updated order.

## 2021-03-09 ENCOUNTER — Other Ambulatory Visit (HOSPITAL_BASED_OUTPATIENT_CLINIC_OR_DEPARTMENT_OTHER): Payer: Self-pay

## 2021-03-09 ENCOUNTER — Other Ambulatory Visit (HOSPITAL_COMMUNITY): Payer: Self-pay

## 2021-03-09 ENCOUNTER — Ambulatory Visit (INDEPENDENT_AMBULATORY_CARE_PROVIDER_SITE_OTHER): Payer: 59 | Admitting: Internal Medicine

## 2021-03-09 ENCOUNTER — Other Ambulatory Visit: Payer: Self-pay

## 2021-03-09 ENCOUNTER — Encounter: Payer: Self-pay | Admitting: Internal Medicine

## 2021-03-09 VITALS — BP 122/80 | HR 61 | Temp 97.6°F | Ht 69.0 in | Wt 218.0 lb

## 2021-03-09 DIAGNOSIS — Z789 Other specified health status: Secondary | ICD-10-CM | POA: Diagnosis not present

## 2021-03-09 DIAGNOSIS — R7309 Other abnormal glucose: Secondary | ICD-10-CM

## 2021-03-09 DIAGNOSIS — I4891 Unspecified atrial fibrillation: Secondary | ICD-10-CM

## 2021-03-09 DIAGNOSIS — Z8673 Personal history of transient ischemic attack (TIA), and cerebral infarction without residual deficits: Secondary | ICD-10-CM

## 2021-03-09 NOTE — Assessment & Plan Note (Signed)
Monitor A1c 

## 2021-03-09 NOTE — Assessment & Plan Note (Signed)
The pt was drinking 2 drinks of burbon /night prior to his illness/A fb. No alcohol now

## 2021-03-09 NOTE — Progress Notes (Signed)
Subjective:  Patient ID: Eddie Dixon, male    DOB: Oct 02, 1965  Age: 57 y.o. MRN: [id number]  CC: No chief complaint on file.   HPI Eddie Dixon presents for post-hosp f/u for CHF, A fib, CVA.   Per hx:  "Admit date: 02/13/2021 Discharge date: 02/23/2021   Admitted From: Home  Disposition:  Home    Recommendations for Outpatient Follow-up:  Follow up with Dr. Alain Marion PCP in 1 week Follow up with Cardiology Dr. Missy Sabins in 1 week Follow up with Neurology for stroke in 4-6 weeks Dr. Alain Marion: Please obtain BMP/CBC in one week      Home Health: None  Equipment/Devices: None new   Discharge Condition: Stable  CODE STATUS: FULL Diet recommendation: Cardiac   Brief/Interim Summary: Eddie Dixon is a 56 y.o. M with recent dx Atrial fibrillation, also hx alcohol use who presented with weight gain, chest tightness/tachycardia and leg swelling.   In the ER, CTA chest showed pleural effusion, cardiomegaly.  Admitted and started on Lasix, amiodarone infusion.         PRINCIPAL HOSPITAL DIAGNOSIS: Acute on chronic systolic and diastolic CHF       Discharge Diagnoses:  Acute on chronic systolic and diastolic CHF Patient admitted with cardiomegaly, leg swelling, weight gain, and pleural effusion on chest imaging.  Started on Lasix, creatinine improved with decongestion.   Echocardiogram obtained shows EF 20 to 25%.  CHF team consulted, started Aldactone, Farxiga, [place].    Atrial fibrillation, paroxysmal with rapid ventricular rate Xarelto transition to Eliquis.  Discharged on new amiodarone.  New acute embolic stroke, left MCA On 1/29 while in the hospital, patient had acute onset right hemiplegia which resolved within an hour.  Code stroke called, tPA not given due to already on anticoagulation and symptoms resolving.  Neurology recommended transition to Eliquis, atorvastatin, neurology follow-up in 4 to 6 weeks. -Outpatient sleep study recommended  Alcohol use  disorder Cessation recommended  Acute kidney injury Creatinine 1.8 on admission, improved to 1.3 on discharge  Prediabetes A1c 6.1%  Obesity BMI 32"      Outpatient Medications Prior to Visit  Medication Sig Dispense Refill   amiodarone (PACERONE) 200 MG tablet Take 1 tablet (200 mg total) by mouth 2 (two) times daily. 60 tablet 3   apixaban (ELIQUIS) 5 MG TABS tablet Take 1 tablet (5 mg total) by mouth 2 (two) times daily. 60 tablet 3   atorvastatin (LIPITOR) 40 MG tablet Take 1 tablet (40 mg total) by mouth daily. 30 tablet 3   carvedilol (COREG) 6.25 MG tablet Take 1 tablet (6.25 mg total) by mouth 2 (two) times daily with a meal. 60 tablet 3   Cholecalciferol (VITAMIN D3) 50 MCG (2000 UT) capsule Take 1 capsule (2,000 Units total) by mouth daily. 100 capsule 3   dapagliflozin propanediol (FARXIGA) 10 MG TABS tablet Take 1 tablet (10 mg total) by mouth daily. 30 tablet 3   digoxin (LANOXIN) 0.125 MG tablet Take 0.5 tablets (0.0625 mg total) by mouth daily. 30 tablet 3   losartan (COZAAR) 25 MG tablet Take 0.5 tablets (12.5 mg total) by mouth daily. 90 tablet 1   Multiple Vitamins-Minerals (MULTIVITAMIN ADULTS) TABS Take 1 tablet by mouth daily.     spironolactone (ALDACTONE) 25 MG tablet Take 1/2 tablet (12.5 mg total) by mouth daily. 15 tablet 3   torsemide (DEMADEX) 20 MG tablet Take 1 tablet (20 mg total) by mouth daily as needed (for weight gain > 3 lb in 24 hrs or  5 lb over dry weight). 30 tablet 3   traZODone (DESYREL) 50 MG tablet Take 0.5-1 tablets (25-50 mg total) by mouth at bedtime as needed for sleep. 30 tablet 5   No facility-administered medications prior to visit.    ROS: Review of Systems  Constitutional:  Positive for fatigue. Negative for appetite change and unexpected weight change.  HENT:  Negative for congestion, nosebleeds, sneezing, sore throat and trouble swallowing.   Eyes:  Negative for itching and visual disturbance.  Respiratory:  Negative for cough  and shortness of breath.   Cardiovascular:  Negative for chest pain, palpitations and leg swelling.  Gastrointestinal:  Negative for abdominal distention, blood in stool, diarrhea and nausea.  Genitourinary:  Negative for frequency and hematuria.  Musculoskeletal:  Negative for back pain, gait problem, joint swelling and neck pain.  Skin:  Negative for rash.  Neurological:  Negative for dizziness, tremors, speech difficulty and weakness.  Psychiatric/Behavioral:  Negative for agitation, dysphoric mood, sleep disturbance and suicidal ideas. The patient is not nervous/anxious.    Objective:  BP 122/80 (BP Location: Left Arm, Patient Position: Sitting, Cuff Size: Large)    Pulse 61    Temp 97.6 F (36.4 C) (Oral)    Ht 5\' 9"  (1.753 m)    Wt 218 lb (98.9 kg)    SpO2 99%    BMI 32.19 kg/m   BP Readings from Last 3 Encounters:  03/09/21 122/80  03/01/21 116/90  02/23/21 102/72    Wt Readings from Last 3 Encounters:  03/09/21 218 lb (98.9 kg)  03/01/21 222 lb (100.7 kg)  02/23/21 217 lb 13 oz (98.8 kg)    Physical Exam Constitutional:      General: He is not in acute distress.    Appearance: He is well-developed.     Comments: NAD  Eyes:     Conjunctiva/sclera: Conjunctivae normal.     Pupils: Pupils are equal, round, and reactive to light.  Neck:     Thyroid: No thyromegaly.     Vascular: No JVD.  Cardiovascular:     Rate and Rhythm: Normal rate and regular rhythm.     Heart sounds: Normal heart sounds. No murmur heard.   No friction rub. No gallop.  Pulmonary:     Effort: Pulmonary effort is normal. No respiratory distress.     Breath sounds: Normal breath sounds. No wheezing or rales.  Chest:     Chest wall: No tenderness.  Abdominal:     General: Bowel sounds are normal. There is no distension.     Palpations: Abdomen is soft. There is no mass.     Tenderness: There is no abdominal tenderness. There is no guarding or rebound.  Musculoskeletal:        General: No  tenderness. Normal range of motion.     Cervical back: Normal range of motion.  Lymphadenopathy:     Cervical: No cervical adenopathy.  Skin:    General: Skin is warm and dry.     Findings: No rash.  Neurological:     Mental Status: He is alert and oriented to person, place, and time.     Cranial Nerves: No cranial nerve deficit.     Motor: No abnormal muscle tone.     Coordination: Coordination normal.     Gait: Gait normal.     Deep Tendon Reflexes: Reflexes are normal and symmetric.  Psychiatric:        Behavior: Behavior normal.  Thought Content: Thought content normal.        Judgment: Judgment normal.    Lab Results  Component Value Date   WBC 5.1 03/01/2021   HGB 16.9 03/01/2021   HCT 50.1 03/01/2021   PLT 215 03/01/2021   GLUCOSE 93 03/01/2021   CHOL 152 02/21/2021   TRIG 62 02/21/2021   HDL 47 02/21/2021   LDLDIRECT 146.3 01/24/2008   LDLCALC 93 02/21/2021   ALT 68 (H) 02/13/2021   AST 47 (H) 02/13/2021   NA 140 03/01/2021   K 4.9 03/01/2021   CL 104 03/01/2021   CREATININE 1.36 (H) 03/01/2021   BUN 18 03/01/2021   CO2 26 03/01/2021   TSH 2.80 01/19/2021   PSA 0.57 01/19/2021   INR 1.1 02/13/2021   HGBA1C 5.6 02/21/2021    No results found.  Assessment & Plan:   Problem List Items Addressed This Visit     ABNORMAL GLUCOSE NEC    Monitor A1c      Alcohol use    The pt was drinking 2 drinks of burbon /night prior to his illness/A fb      Atrial fibrillation with RVR ([place]) - Primary    The pt was drinking 2 drinks of burbon /night prior to his illness/A fb. No alcohol now      History of cardioembolic cerebrovascular accident (CVA)    On 1/29 while in the hospital, patient had acute onset right hemiplegia which resolved within an hour.  Code stroke called, tPA not given due to already on anticoagulation and symptoms resolving.  Neurology recommended transition to Eliquis, atorvastatin. No residual sx's         No orders of the  defined types were placed in this encounter.     Follow-up: No follow-ups on file.  Walker Kehr, MD

## 2021-03-09 NOTE — Assessment & Plan Note (Signed)
The pt was drinking 2 drinks of burbon /night prior to his illness/A fb

## 2021-03-09 NOTE — Assessment & Plan Note (Signed)
On 1/29 while in the hospital, patient had acute onset right hemiplegia which resolved within an hour.  Code stroke called, tPA not given due to already on anticoagulation and symptoms resolving.  Neurology recommended transition to Eliquis, atorvastatin. No residual sx's

## 2021-03-10 ENCOUNTER — Other Ambulatory Visit (HOSPITAL_COMMUNITY): Payer: Self-pay

## 2021-03-10 ENCOUNTER — Telehealth (HOSPITAL_COMMUNITY): Payer: Self-pay

## 2021-03-10 NOTE — Telephone Encounter (Signed)
Pharmacy Transitions of Care Follow-up Telephone Call  Date of discharge: 02/23/2021 Discharge Diagnosis: Secondary Stroke Prevention  How have you been since you were released from the hospital? Patient reports doing well. No questions or concerns at this time    Medication changes made at discharge:  - START: Eliquis 5 mg BID  - STOPPED: Xarelto 20 mg daily with meals  Medication changes verified by the patient? Yes (Yes/No)    Medication Accessibility:  Home Pharmacy: [facility] [place]  Was the patient provided with refills on discharged medications? Yes   Have all prescriptions been transferred from [facility] to home pharmacy? Yes   Is the patient able to afford medications? Patient has insurance and qualifies for copay assistance card    Medication Review:  APIXABAN (ELIQUIS)  Apixaban 5 mg BID initiated on 02/21/2021.  - Discussed importance of taking medication around the same time everyday  - Advised patient of medications to avoid (NSAIDs, ASA)  - Educated that Tylenol (acetaminophen) will be the preferred analgesic to prevent risk of bleeding  - Emphasized importance of monitoring for signs and symptoms of bleeding (abnormal bruising, prolonged bleeding, nose bleeds, bleeding from gums, discolored urine, black tarry stools)  - Advised patient to alert all providers of anticoagulation therapy prior to starting a new medication or having a procedure   Follow-up Appointments:  PCP Hospital f/u appt confirmed? Saw Dr. Posey Rea on 03/09/2021  Specialist Hospital f/u appt confirmed? Cardiology Scheduled to see Dr. Allyson Sabal on 03/12/2021 @ 9:30.   If their condition worsens, is the pt aware to call PCP or go to the Emergency Dept.? Yes  Final Patient Assessment: Patient [facility] [address] He did have concerns due to Eliquis's high copay even with commercial insurance. [facility] information of Eliquis $10 Copay Card. Patient reports aware  of side effects and things to look out for on Eliquis, was previously on Xarelto. No other questions at this time.

## 2021-03-11 ENCOUNTER — Other Ambulatory Visit (HOSPITAL_COMMUNITY): Payer: 59

## 2021-03-11 ENCOUNTER — Other Ambulatory Visit: Payer: Self-pay

## 2021-03-11 ENCOUNTER — Telehealth: Payer: Self-pay

## 2021-03-11 DIAGNOSIS — I509 Heart failure, unspecified: Secondary | ICD-10-CM

## 2021-03-11 DIAGNOSIS — I4891 Unspecified atrial fibrillation: Secondary | ICD-10-CM

## 2021-03-11 NOTE — Telephone Encounter (Addendum)
Patient called in and had questions about what medications he should take. I went over all of the medications that were ordered on 2/6 by Prince Rome, FNP and answered pt questions. Went over checking pulse rate before taking digoxin. Pt has appointment with Dr. Allyson Sabal tomorrow and will bring his meds with him.

## 2021-03-12 ENCOUNTER — Other Ambulatory Visit: Payer: Self-pay

## 2021-03-12 ENCOUNTER — Encounter: Payer: Self-pay | Admitting: Cardiovascular Disease

## 2021-03-12 ENCOUNTER — Ambulatory Visit (INDEPENDENT_AMBULATORY_CARE_PROVIDER_SITE_OTHER): Payer: 59 | Admitting: Cardiovascular Disease

## 2021-03-12 DIAGNOSIS — F17200 Nicotine dependence, unspecified, uncomplicated: Secondary | ICD-10-CM | POA: Diagnosis not present

## 2021-03-12 DIAGNOSIS — I5021 Acute systolic (congestive) heart failure: Secondary | ICD-10-CM | POA: Diagnosis not present

## 2021-03-12 DIAGNOSIS — I4891 Unspecified atrial fibrillation: Secondary | ICD-10-CM

## 2021-03-12 DIAGNOSIS — Z789 Other specified health status: Secondary | ICD-10-CM

## 2021-03-12 NOTE — Assessment & Plan Note (Signed)
Dips tobacco but does not smoke

## 2021-03-12 NOTE — Assessment & Plan Note (Signed)
History of A-fib with RVR currently rate controlled on amiodarone, carvedilol and Eliquis.  He did have a TIA while in the hospital.  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 Johnsie Cancel precluding him from being cardioverted at that time.  He is scheduled for DC cardioversion by Dr. Haroldine Laws in March.

## 2021-03-12 NOTE — Progress Notes (Signed)
03/12/2021 Eddie Dixon   03/25/1965  [id number]  Primary Physician Plotnikov, Evie Lacks, MD Primary Cardiologist: Lorretta Harp MD [name], [place]  HPI:  Eddie Dixon is a 56 y.o.  mildly overweight married male father of 2 children who works as a Engineer, manufacturing systems.  He was referred by Dr. Alain Marion, his PCP, for evaluation of newly recognized A. fib with RVR.  I last saw him in the office 01/28/2021.  He has no cardiac risk factors other than a premature family history of CAD.  His father died of a heart attack at age 79.  He has never had a heart attack or stroke.  He has complaints of dyspnea on exertion over the last several months.  He was found to be in A. fib yesterday by Dr. Alain Marion who began him on a low-dose beta-blocker and Xarelto. The CHA2DSVASC2 score is  0 .  He was admitted to the hospital approximately 2 weeks after he saw me in heart failure.  He was diuresed and underwent right left heart cath by Dr. Haroldine Laws revealing no evidence of CAD.  He underwent TEE guided attempted cardioversion by Dr. Johnsie Cancel who demonstrated a left atrial appendage thrombus and therefore cardioversion was not performed.  He was placed on amiodarone and guideline directed optimal medical therapy.  He has limited his ethanol intake and has been more compliant with his medications.  He feels clinically improved.  He is scheduled for cardioversion by Dr. Haroldine Laws on 03/26/2021.   Current Meds  Medication Sig   amiodarone (PACERONE) 200 MG tablet Take 1 tablet (200 mg total) by mouth 2 (two) times daily.   apixaban (ELIQUIS) 5 MG TABS tablet Take 1 tablet (5 mg total) by mouth 2 (two) times daily.   atorvastatin (LIPITOR) 40 MG tablet Take 1 tablet (40 mg total) by mouth daily.   carvedilol (COREG) 6.25 MG tablet Take 1 tablet (6.25 mg total) by mouth 2 (two) times daily with a meal.   Cholecalciferol (VITAMIN D3) 50 MCG (2000 UT) capsule Take 1 capsule (2,000 Units total) by mouth  daily.   dapagliflozin propanediol (FARXIGA) 10 MG TABS tablet Take 1 tablet (10 mg total) by mouth daily.   digoxin (LANOXIN) 0.125 MG tablet Take 0.5 tablets (0.0625 mg total) by mouth daily.   losartan (COZAAR) 25 MG tablet Take 0.5 tablets (12.5 mg total) by mouth daily.   Multiple Vitamins-Minerals (MULTIVITAMIN ADULTS) TABS Take 1 tablet by mouth daily.   spironolactone (ALDACTONE) 25 MG tablet Take 1/2 tablet (12.5 mg total) by mouth daily.   torsemide (DEMADEX) 20 MG tablet Take 1 tablet (20 mg total) by mouth daily as needed (for weight gain > 3 lb in 24 hrs or 5 lb over dry weight).   traZODone (DESYREL) 50 MG tablet Take 0.5-1 tablets (25-50 mg total) by mouth at bedtime as needed for sleep.     No Known Allergies  Social History   Socioeconomic History   Marital status: Divorced    Spouse name: Not on file   Number of children: 2   Years of education: Not on file   Highest education level: Some college, no degree  Occupational History   Occupation: Librarian, academic    Comment: [employer]  Tobacco Use   Smoking status: Never   Smokeless tobacco: Current    Types: Chew  Vaping Use   Vaping Use: Never used  Substance and Sexual Activity   Alcohol use: Yes    Alcohol/week: 17.0  standard drinks    Types: 17 Shots of liquor per week    Comment: drinks fifth of liquor per week x 1 year.   Drug use: No   Sexual activity: Yes  Other Topics Concern   Not on file  Social History Narrative   Regular Exercise -  YES         Social Determinants of Health   Financial Resource Strain: High Risk   Difficulty of Paying Living Expenses: Hard  Food Insecurity: No Food Insecurity   Worried About Running Out of Food in the Last Year: Never true   Ran Out of Food in the Last Year: Never true  Transportation Needs: No Transportation Needs   Lack of Transportation (Medical): No   Lack of Transportation (Non-Medical): No  Physical Activity: Not on file  Stress: Not on file  Social  Connections: Not on file  Intimate Partner Violence: Not on file     Review of Systems: General: negative for chills, fever, night sweats or weight changes.  Cardiovascular: negative for chest pain, dyspnea on exertion, edema, orthopnea, palpitations, paroxysmal nocturnal dyspnea or shortness of breath Dermatological: negative for rash Respiratory: negative for cough or wheezing Urologic: negative for hematuria Abdominal: negative for nausea, vomiting, diarrhea, bright red blood per rectum, melena, or hematemesis Neurologic: negative for visual changes, syncope, or dizziness All other systems reviewed and are otherwise negative except as noted above.    Blood pressure 93/65, pulse 74, height 5\' 8"  (1.727 m), weight 219 lb (99.3 kg), SpO2 97 %.  General appearance: alert and no distress Neck: no adenopathy, no carotid bruit, no JVD, supple, symmetrical, trachea midline, and thyroid not enlarged, symmetric, no tenderness/mass/nodules Lungs: clear to auscultation bilaterally Heart: irregularly irregular rhythm Extremities: extremities normal, atraumatic, no cyanosis or edema Pulses: 2+ and symmetric Skin: Skin color, texture, turgor normal. No rashes or lesions Neurologic: Grossly normal  EKG atrial fibrillation with a ventricular sponsor 74 and poor R wave progression.  I personally reviewed this EKG.  ASSESSMENT AND PLAN:   TOBACCO USE DISORDER/SMOKER-SMOKING CESSATION DISCUSSED Dips tobacco but does not smoke  Atrial fibrillation with RVR (HCC) History of A-fib with RVR currently rate controlled on amiodarone, carvedilol and Eliquis.  He did have a TIA while in the hospital.  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 Johnsie Cancel precluding him from being cardioverted at that time.  He is scheduled for DC cardioversion by Dr. Haroldine Laws in March.  Acute HFrEF (heart failure with reduced ejection fraction) [facility]) Patient underwent right and left heart cath by Dr.  Haroldine Laws reviewed revealing no evidence of CAD.  His EF is in the 25% range.  He was diuresed and currently weighs 17 pounds less than when I last saw him on torsemide.  He feels clinically improved.  He is on losartan, carvedilol and spironolactone as well.  He is aware of salt restriction.  Alcohol use The patient's ethanol intake has decreased dramatically since hospitalization.     Lorretta Harp MD FACP,FACC,FAHA, [facility] 03/12/2021 10:03 AM

## 2021-03-12 NOTE — Assessment & Plan Note (Signed)
Patient underwent right and left heart cath by Dr. Haroldine Laws reviewed revealing no evidence of CAD.  His EF is in the 25% range.  He was diuresed and currently weighs 17 pounds less than when I last saw him on torsemide.  He feels clinically improved.  He is on losartan, carvedilol and spironolactone as well.  He is aware of salt restriction.

## 2021-03-12 NOTE — Patient Instructions (Signed)
Medication Instructions:  Your physician recommends that you continue on your current medications as directed. Please refer to the Current Medication list given to you today.  *If you need a refill on your cardiac medications before your next appointment, please call your pharmacy*   Follow-Up: At [facility], you and your health needs are our priority.  As part of our continuing mission to provide you with exceptional heart care, we have created designated Provider Care Teams.  These Care Teams include your primary Cardiologist (physician) and Advanced Practice Providers (APPs -  Physician Assistants and Nurse Practitioners) who all work together to provide you with the care you need, when you need it.  We recommend signing up for the patient portal called "MyChart".  Sign up information is provided on this After Visit Summary.  MyChart is used to connect with patients for Virtual Visits (Telemedicine).  Patients are able to view lab/test results, encounter notes, upcoming appointments, etc.  Non-urgent messages can be sent to your provider as well.   To learn more about what you can do with MyChart, go to [email or web address].    Your next appointment:   3 month(s)  The format for your next appointment:   In Person  Provider:   Jonathan Berry, MD 

## 2021-03-12 NOTE — Assessment & Plan Note (Signed)
The patient's ethanol intake has decreased dramatically since hospitalization.

## 2021-03-15 ENCOUNTER — Telehealth: Payer: Self-pay | Admitting: Cardiovascular Disease

## 2021-03-15 NOTE — Telephone Encounter (Signed)
Patient is returning call.  °

## 2021-03-15 NOTE — Telephone Encounter (Signed)
Spoke with pt, aware unless his heart rate is in the 40's that he can take all his medications as scheduled.

## 2021-03-15 NOTE — Telephone Encounter (Signed)
Patient calling in to see what medication he is not supposed to take once he check his HR and its a certain number. He doesn't remember which medication it is or the number for the HR. Please advise

## 2021-03-15 NOTE — Telephone Encounter (Signed)
Left message for pt to call.

## 2021-03-18 ENCOUNTER — Encounter (HOSPITAL_COMMUNITY): Payer: Self-pay | Admitting: Internal Medicine

## 2021-03-25 ENCOUNTER — Encounter (HOSPITAL_COMMUNITY): Payer: Self-pay | Admitting: Anesthesiology

## 2021-03-25 NOTE — Anesthesia Preprocedure Evaluation (Deleted)
Anesthesia Evaluation  ? ? ?Reviewed: ?Allergy & Precautions, H&P , Patient's Chart, lab work & pertinent test results ? ?Airway ? ? ? ? ? ? ? Dental ?  ?Pulmonary ?neg pulmonary ROS,  ?  ? ? ? ? ? ? ? Cardiovascular ?Exercise Tolerance: Good ?+CHF  ?negative cardio ROS ? ?+ dysrhythmias Atrial Fibrillation  ? ? ?  ?Neuro/Psych ?Anxiety CVA negative neurological ROS ? negative psych ROS  ? GI/Hepatic ?negative GI ROS, Neg liver ROS,   ?Endo/Other  ?negative endocrine ROS ? Renal/GU ?Renal diseasenegative Renal ROS  ?negative genitourinary ?  ?Musculoskeletal ? ? Abdominal ?  ?Peds ? Hematology ?negative hematology ROS ?(+)   ?Anesthesia Other Findings ? ? Reproductive/Obstetrics ?negative OB ROS ? ?  ? ? ? ? ? ? ? ? ? ? ? ? ? ?  ?  ? ? ? ? ? ? ? ? ?Anesthesia Physical ?Anesthesia Plan ? ?ASA: 3 ? ?Anesthesia Plan: General  ? ?Post-op Pain Management: Minimal or no pain anticipated  ? ?Induction: Intravenous ? ?PONV Risk Score and Plan: 2 and Propofol infusion and Treatment may vary due to age or medical condition ? ?Airway Management Planned: Mask ? ?Additional Equipment:  ? ?Intra-op Plan:  ? ?Post-operative Plan:  ? ?Informed Consent: I have reviewed the patients History and Physical, chart, labs and discussed the procedure including the risks, benefits and alternatives for the proposed anesthesia with the patient or authorized representative who has indicated his/her understanding and acceptance.  ? ? ? ? ? ?Plan Discussed with:  ? ?Anesthesia Plan Comments: (Case cancelled. Pt in NSR.)  ? ? ? ? ? ?Anesthesia Quick Evaluation ? ?

## 2021-03-26 ENCOUNTER — Ambulatory Visit (HOSPITAL_COMMUNITY)
Admission: RE | Admit: 2021-03-26 | Discharge: 2021-03-26 | Disposition: A | Discharge status: Home / Self Care | Payer: Managed Care, Other (non HMO) | Attending: Internal Medicine | Admitting: Internal Medicine

## 2021-03-26 ENCOUNTER — Encounter (HOSPITAL_COMMUNITY)
Admission: RE | Disposition: A | Discharge status: Home / Self Care | Payer: Self-pay | Source: Home / Self Care | Attending: Internal Medicine

## 2021-03-26 ENCOUNTER — Encounter (HOSPITAL_COMMUNITY): Payer: Self-pay | Admitting: Internal Medicine

## 2021-03-26 ENCOUNTER — Other Ambulatory Visit (HOSPITAL_COMMUNITY): Payer: Self-pay

## 2021-03-26 DIAGNOSIS — Z539 Procedure and treatment not carried out, unspecified reason: Secondary | ICD-10-CM | POA: Diagnosis present

## 2021-03-26 SURGERY — CANCELLED PROCEDURE

## 2021-03-26 NOTE — Progress Notes (Signed)
Pt in NSR this am, DCCV canceled per MD Bensimhon  ?

## 2021-04-09 ENCOUNTER — Telehealth (HOSPITAL_COMMUNITY): Payer: Self-pay

## 2021-04-09 NOTE — Telephone Encounter (Signed)
Called and left patient a voice message to confirm/remind patient of their appointment at the [facility] on 04/12/21.  ? ? ? ?

## 2021-04-12 ENCOUNTER — Other Ambulatory Visit: Payer: Self-pay

## 2021-04-12 ENCOUNTER — Encounter (HOSPITAL_COMMUNITY): Payer: Self-pay

## 2021-04-12 ENCOUNTER — Ambulatory Visit (HOSPITAL_COMMUNITY)
Admission: RE | Admit: 2021-04-12 | Discharge: 2021-04-12 | Disposition: A | Discharge status: Home / Self Care | Payer: Managed Care, Other (non HMO) | Source: Ambulatory Visit | Attending: Family Medicine | Admitting: Family Medicine

## 2021-04-12 VITALS — BP 120/80 | HR 57 | Wt 222.8 lb

## 2021-04-12 DIAGNOSIS — I4891 Unspecified atrial fibrillation: Secondary | ICD-10-CM | POA: Diagnosis not present

## 2021-04-12 DIAGNOSIS — Z7984 Long term (current) use of oral hypoglycemic drugs: Secondary | ICD-10-CM | POA: Insufficient documentation

## 2021-04-12 DIAGNOSIS — I5021 Acute systolic (congestive) heart failure: Secondary | ICD-10-CM | POA: Diagnosis not present

## 2021-04-12 DIAGNOSIS — R531 Weakness: Secondary | ICD-10-CM | POA: Insufficient documentation

## 2021-04-12 DIAGNOSIS — Z789 Other specified health status: Secondary | ICD-10-CM | POA: Diagnosis not present

## 2021-04-12 DIAGNOSIS — R0609 Other forms of dyspnea: Secondary | ICD-10-CM | POA: Insufficient documentation

## 2021-04-12 DIAGNOSIS — Z09 Encounter for follow-up examination after completed treatment for conditions other than malignant neoplasm: Secondary | ICD-10-CM | POA: Diagnosis not present

## 2021-04-12 DIAGNOSIS — I5022 Chronic systolic (congestive) heart failure: Secondary | ICD-10-CM | POA: Insufficient documentation

## 2021-04-12 DIAGNOSIS — I513 Intracardiac thrombosis, not elsewhere classified: Secondary | ICD-10-CM

## 2021-04-12 DIAGNOSIS — Z79899 Other long term (current) drug therapy: Secondary | ICD-10-CM | POA: Insufficient documentation

## 2021-04-12 DIAGNOSIS — I480 Paroxysmal atrial fibrillation: Secondary | ICD-10-CM | POA: Diagnosis not present

## 2021-04-12 DIAGNOSIS — Z8673 Personal history of transient ischemic attack (TIA), and cerebral infarction without residual deficits: Secondary | ICD-10-CM | POA: Diagnosis not present

## 2021-04-12 DIAGNOSIS — F17220 Nicotine dependence, chewing tobacco, uncomplicated: Secondary | ICD-10-CM | POA: Diagnosis not present

## 2021-04-12 DIAGNOSIS — I2510 Atherosclerotic heart disease of native coronary artery without angina pectoris: Secondary | ICD-10-CM | POA: Diagnosis not present

## 2021-04-12 DIAGNOSIS — J90 Pleural effusion, not elsewhere classified: Secondary | ICD-10-CM | POA: Diagnosis not present

## 2021-04-12 DIAGNOSIS — R7303 Prediabetes: Secondary | ICD-10-CM | POA: Insufficient documentation

## 2021-04-12 DIAGNOSIS — Z7901 Long term (current) use of anticoagulants: Secondary | ICD-10-CM | POA: Insufficient documentation

## 2021-04-12 DIAGNOSIS — Z8709 Personal history of other diseases of the respiratory system: Secondary | ICD-10-CM

## 2021-04-12 DIAGNOSIS — Z86718 Personal history of other venous thrombosis and embolism: Secondary | ICD-10-CM | POA: Insufficient documentation

## 2021-04-12 DIAGNOSIS — I428 Other cardiomyopathies: Secondary | ICD-10-CM | POA: Diagnosis not present

## 2021-04-12 DIAGNOSIS — F1010 Alcohol abuse, uncomplicated: Secondary | ICD-10-CM | POA: Insufficient documentation

## 2021-04-12 DIAGNOSIS — I63419 Cerebral infarction due to embolism of unspecified middle cerebral artery: Secondary | ICD-10-CM

## 2021-04-12 LAB — BASIC METABOLIC PANEL
Anion gap: 6 (ref 5–15)
BUN: 18 mg/dL (ref 6–20)
CO2: 29 mmol/L (ref 22–32)
Calcium: 9.6 mg/dL (ref 8.9–10.3)
Chloride: 107 mmol/L (ref 98–111)
Creatinine, Ser: 1.27 mg/dL — ABNORMAL HIGH (ref 0.61–1.24)
GFR, Estimated: >60 mL/min (ref >60)
Glucose, Bld: 101 mg/dL — ABNORMAL HIGH (ref 70–99)
Potassium: 4.4 mmol/L (ref 3.5–5.1)
Sodium: 142 mmol/L (ref 135–145)

## 2021-04-12 LAB — DIGOXIN LEVEL: Digoxin Level: <0.2 ng/mL — ABNORMAL LOW (ref 0.8–2.0)

## 2021-04-12 MED ORDER — LOSARTAN POTASSIUM 25 MG PO TABS: 25.0000 mg | ORAL_TABLET | Freq: Every day | ORAL | 4 refills | Status: DC

## 2021-04-12 MED ORDER — AMIODARONE HCL 200 MG PO TABS: 200.0000 mg | ORAL_TABLET | Freq: Every day | ORAL | 5 refills | Status: DC

## 2021-04-12 NOTE — Progress Notes (Signed)
? ?ADVANCED HF CLINIC NOTE ? ?Primary Care: Plotnikov, Evie Lacks, MD ?HF Cardiologist: Dr. Haroldine Laws ? ?HPI: ?Eddie Dixon is a 56 y.o.male with newly diagnosed CHF and Afib. Has hx of ETOH abuse. He established care with Dr. Gwenlyn Found on 01/28/21 for Afib with RVR diagnosed at PCP's office the day prior. Noted increasing dyspnea with exertion for several months. He was started on Xarelto and beta blocker.  ?  ?Admitted 1/23 with a/c CHF and with AF with RVR. Beside echo with LVEF 20-25%. Started on IV lasix and carvedilol. Initially added amiodarone gtt but later stopped d/t concern for chemical conversion with nonadherence with anticoagulation. Formal echo showed EF 20-25%, RV moderately reduced, biatrial enlargement, mild to moderate MR, moderate TR. TEE showed LAA thrombus so unable to proceed with DCCV. BP were soft with sub-optimal UOP, and AHF consulted d/t concern for low-output CHF and to assess for advanced therapies. PICC placed, Co-Ox monitored. Underwent R/LHC showing minimal CAD, well compensated filling pressures and normal output. 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 cMRI showed EF 21% RV 25%. LAA thrombus. No scar. Had RUE weakness, [facility] Stroke called. CTA head and neck normal, MRI with small acute infarcts and he was placed on Eliquis. GDMT titrated and he was discharged home, weight 222 lbs. ?  ?Hospital follow up, losartan started and DC-CV arranged, subsequently cancelled after spontaneously converting to NSR.  ? ?Today he returns for HF follow up. Overall feeling fatigued, does not feel he is getting restful sleep. 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 Appetite ok. No fever or chills. Weight at home 221 pounds. Taking all medications. Drinks 1 beer every other day now (says he has cut back 80-90% of ETOh intake), has not had any bourbon since hospitalization. Chews tobacco.  Has not needed any torsemide. ? ?Cardiac  Studies: ?- Echo (1/23): EF 20-25%, RV moderately reduced, biatrial enlargement, mild to moderate MR, moderate TR ? ?- TEE (1/23): EF 30-35%, RV mildly reduced, LAA thrombus, possible  ? ?- cMRI (1/23): LVEF 21% RV 25%. LAA thrombus. No scar. NICM Tachy mediated + ETOH.  ? ?- R/LHC (1/23): ? ?  Prox RCA lesion is 20% stenosed. ?  The left ventricular ejection fraction is less than 25% by visual estimate. ?  ?Ao = 96/73 (83) ?LV = 97/11 ?RA = 6 ?RV = 31/7 ?PA = 30/5 (22) ?PCW = 17 ?Fick cardiac output/index = 6.4/3.0 ?PVR = 0.80 WU ?SVR = 960 ?Ao sat = 95%  ?PA sat = 73%, 77%  ?SVC sat = 73% ? ?1. Minimal nonobstructive CAD ?2. Severe NICM EF ~20% ?3. Well-compensated filling pressures and normal output ? 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 ? ?Past Medical History:  ?Diagnosis Date  ? A-fib ([place])   ? Anxiety   ? ED (erectile dysfunction)   ? Elevated blood pressure   ? ?Current Outpatient Medications  ?Medication Sig Dispense Refill  ? amiodarone (PACERONE) 200 MG tablet Take 1 tablet (200 mg total) by mouth 2 (two) times daily. 60 tablet 3  ? apixaban (ELIQUIS) 5 MG TABS tablet Take 1 tablet (5 mg total) by mouth 2 (two) times daily. 60 tablet 3  ? atorvastatin (LIPITOR) 40 MG tablet Take 1 tablet (40 mg total) by mouth daily. 30 tablet 3  ? carvedilol (COREG) 6.25 MG tablet Take 1 tablet (6.25 mg total) by mouth 2 (two) times daily with a meal. 60 tablet 3  ? Cholecalciferol (VITAMIN D3) 50 MCG (  2000 UT) capsule Take 1 capsule (2,000 Units total) by mouth daily. 100 capsule 3  ? dapagliflozin propanediol (FARXIGA) 10 MG TABS tablet Take 1 tablet (10 mg total) by mouth daily. 30 tablet 3  ? digoxin (LANOXIN) 0.125 MG tablet Take 0.5 tablets (0.0625 mg total) by mouth daily. 30 tablet 3  ? losartan (COZAAR) 25 MG tablet Take 0.5 tablets (12.5 mg total) by mouth daily. 90 tablet 1  ? Multiple Vitamins-Minerals (MULTIVITAMIN ADULTS) TABS Take 1 tablet by mouth daily.    ? spironolactone (ALDACTONE) 25 MG tablet Take 1/2 tablet  (12.5 mg total) by mouth daily. 15 tablet 3  ? torsemide (DEMADEX) 20 MG tablet Take 1 tablet (20 mg total) by mouth daily as needed (for weight gain > 3 lb in 24 hrs or 5 lb over dry weight). 30 tablet 3  ? traZODone (DESYREL) 50 MG tablet Take 0.5-1 tablets (25-50 mg total) by mouth at bedtime as needed for sleep. 30 tablet 5  ? ?No current facility-administered medications for this encounter.  ? ?No Known Allergies ? ?Social History  ? ?Socioeconomic History  ? Marital status: Divorced  ?  Spouse name: Not on file  ? Number of children: 2  ? Years of education: Not on file  ? Highest education level: Some college, no degree  ?Occupational History  ? Occupation: Librarian, academic  ?  Comment: [place] [employer]  ?Tobacco Use  ? Smoking status: Never  ? Smokeless tobacco: Current  ?  Types: Chew  ?Vaping Use  ? Vaping Use: Never used  ?Substance and Sexual Activity  ? Alcohol use: Yes  ?  Alcohol/week: 17.0 standard drinks  ?  Types: 17 Shots of liquor per week  ?  Comment: drinks fifth of liquor per week x 1 year.  ? Drug use: No  ? Sexual activity: Yes  ?Other Topics Concern  ? Not on file  ?Social History Narrative  ? Regular Exercise -  YES  ?   ?   ? ?Social Determinants of Health  ? ?Financial Resource Strain: High Risk  ? Difficulty of Paying Living Expenses: Hard  ?Food Insecurity: No Food Insecurity  ? Worried About Charity fundraiser in the Last Year: Never true  ? Ran Out of Food in the Last Year: Never true  ?Transportation Needs: No Transportation Needs  ? Lack of Transportation (Medical): No  ? Lack of Transportation (Non-Medical): No  ?Physical Activity: Not on file  ?Stress: Not on file  ?Social Connections: Not on file  ?Intimate Partner Violence: Not on file  ? ?Family History  ?Problem Relation Age of Onset  ? Heart disease Father 34  ?     MI  ? ?BP 120/80   Pulse (!) 57   Wt 101.1 kg   SpO2 98%   BMI 33.88 kg/m?  ? ?Wt Readings from Last 3 Encounters:  ?04/12/21 101.1 kg  ?03/12/21 99.3 kg  ?03/09/21 98.9  kg  ? ?PHYSICAL EXAM: ?General:  NAD. No resp difficulty ?HEENT: Normal ?Neck: Supple. No JVD. Carotids 2+ bilat; no bruits. No lymphadenopathy or thryomegaly appreciated. ?Cor: PMI nondisplaced. Brady rate & rhythm. No rubs, gallops or murmurs. ?Lungs: Clear ?Abdomen: Obese, nontender, nondistended. No hepatosplenomegaly. No bruits or masses. Good bowel sounds. ?Extremities: No cyanosis, clubbing, rash, edema ?Neuro: Alert & oriented x 3, cranial nerves grossly intact. Moves all 4 extremities w/o difficulty. Affect pleasant. ? ?ECG: SB 54 bpm (personally reviewed) ? ?ASSESSMENT & PLAN: ?Chronic systolic CHF: ?- Suspect tachymediated +-  ETOH.  ?- Echo (1/23): EF 20-25%, RV moderately reduced, biatrial enlargement, mild to moderate MR, moderate TR ?- TEE (1/23): EF 30-35%, RV mildly reduced, LAA thrombus, possible  ?- cMRI (1/23): LVEF 21% RV 25%. LAA thrombus. No scar. NICM Tachy mediated + ETOH.  ?- R/LHC (1/23): minimal CAD, well compensated filling pressures and normal output.  ?- NYHA II (mainly fatigue), volume looks good today. He does not need daily loops. Keep torsemide 20 mg daily PRN. ?- Increase losartan to 25 mg daily. ?- Continue digoxin 0.0625 mg daily. ?- Continue Coreg 6.25 mg bid.  ?- Continue spiro 12.5 mg daily ?- Continue Farxiga 10 mg daily.  ?- BMET and dig level today; repeat BMET in 10-14 days. ?- Repeat echo next visit. ?  ?2. Atrial fibrillation  ?- Newly diagnosed 2/23. In setting of ETOH use. ?- LAA thrombus noted on TEE, DCCV deferred. Spontaneously converted to NSR on amio. ?- SB on ECG today. ?- Decrease amiodarone to 200 mg daily.  ?- Continue Eliquis 5 mg bid. No bleeding issues. ?- Sleep study has been ordered. ?- Discussed ETOH cessation.  ?  ?3. ETOH abuse ?- Discussed importance of cessation.  ?- He has cut down more. ?  ?4. Prediabetes ?- Last A1c 5.6. ?- Continue SGLT2i. ?  ?5. Right pleural effusion ?- Moderate on CTA 02/13/21 ?- Lungs clear on exam today. ?  ?6. LAA Clot  ?-  Continue Eliquis 5 mg bid. ?- As above discussed. ?  ?7. Stroke ?- MRI (1/23) new left MCA embolic ?- No deficits. On Eliquis. ?- He has Neurology follow up soon. ?  ?Follow up in 2 months with Dr. Stacie Acres

## 2021-04-12 NOTE — Patient Instructions (Addendum)
Thank you for coming in today ? ?Labs were done today, if any labs are abnormal the clinic will call you ? ?DECREASE Amiodarone to 200 mg 1 tablet daily  ? ?INCREASE Losartan to 25 mg 1 tablet daily  ? ?Your physician recommends that you schedule a follow-up appointment in:  ?Please keep follow up appointment with Dr. Haroldine Laws ? ?At the [facility], you and your health needs are our priority. As part of our continuing mission to provide you with exceptional heart care, we have created designated Provider Care Teams. These Care Teams include your primary Cardiologist (physician) and Advanced Practice Providers (APPs- Physician Assistants and Nurse Practitioners) who all work together to provide you with the care you need, when you need it.  ? ?You may see any of the following providers on your designated Care Team at your next follow up: ?Dr Glori Bickers ?Dr Loralie Champagne ?Darrick Grinder, NP ?Lyda Jester, PA ?Jessica Milford,NP ?Marlyce Huge, PA ?Audry Riles, PharmD ? ? ?Please be sure to bring in all your medications bottles to every appointment.  ? ?If you have any questions or concerns before your next appointment please send [place] a message through [place] or call our office at [phone].   ? ?TO LEAVE A MESSAGE FOR THE NURSE SELECT OPTION 2, PLEASE LEAVE A MESSAGE INCLUDING: ?YOUR NAME ?DATE OF BIRTH ?CALL BACK NUMBER ?REASON FOR CALL**this is important as we prioritize the call backs ? ?YOU WILL RECEIVE A CALL BACK THE SAME DAY AS LONG AS YOU CALL BEFORE 4:00 PM ? ?

## 2021-04-21 ENCOUNTER — Other Ambulatory Visit (HOSPITAL_COMMUNITY): Payer: Self-pay | Admitting: *Deleted

## 2021-04-21 MED ORDER — DIGOXIN 125 MCG PO TABS: 0.0625 mg | ORAL_TABLET | Freq: Every day | ORAL | 3 refills | Status: DC

## 2021-04-26 ENCOUNTER — Ambulatory Visit (HOSPITAL_COMMUNITY)
Admission: RE | Admit: 2021-04-26 | Discharge: 2021-04-26 | Disposition: A | Discharge status: Home / Self Care | Payer: Managed Care, Other (non HMO) | Source: Ambulatory Visit | Attending: Internal Medicine | Admitting: Internal Medicine

## 2021-04-26 DIAGNOSIS — I509 Heart failure, unspecified: Secondary | ICD-10-CM | POA: Insufficient documentation

## 2021-04-26 LAB — BASIC METABOLIC PANEL
Anion gap: 6 (ref 5–15)
BUN: 17 mg/dL (ref 6–20)
CO2: 29 mmol/L (ref 22–32)
Calcium: 9.3 mg/dL (ref 8.9–10.3)
Chloride: 104 mmol/L (ref 98–111)
Creatinine, Ser: 1.37 mg/dL — ABNORMAL HIGH (ref 0.61–1.24)
GFR, Estimated: >60 mL/min (ref >60)
Glucose, Bld: 72 mg/dL (ref 70–99)
Potassium: 4.5 mmol/L (ref 3.5–5.1)
Sodium: 139 mmol/L (ref 135–145)

## 2021-05-02 ENCOUNTER — Ambulatory Visit (HOSPITAL_COMMUNITY)
Admission: EM | Admit: 2021-05-02 | Discharge: 2021-05-02 | Disposition: A | Discharge status: Home / Self Care | Payer: Managed Care, Other (non HMO) | Attending: Internal Medicine | Admitting: Internal Medicine

## 2021-05-02 ENCOUNTER — Encounter (HOSPITAL_COMMUNITY): Payer: Self-pay | Admitting: Emergency Medicine

## 2021-05-02 DIAGNOSIS — S61210A Laceration without foreign body of right index finger without damage to nail, initial encounter: Secondary | ICD-10-CM | POA: Diagnosis not present

## 2021-05-02 DIAGNOSIS — Z23 Encounter for immunization: Secondary | ICD-10-CM

## 2021-05-02 MED ORDER — TETANUS-DIPHTH-ACELL PERTUSSIS 5-2.5-18.5 LF-MCG/0.5 IM SUSY
PREFILLED_SYRINGE | INTRAMUSCULAR | Status: AC
  Filled 2021-05-02: qty 0.5

## 2021-05-02 MED ORDER — TETANUS-DIPHTH-ACELL PERTUSSIS 5-2.5-18.5 LF-MCG/0.5 IM SUSY
0.5000 mL | PREFILLED_SYRINGE | Freq: Once | INTRAMUSCULAR | Status: AC
  Administered 2021-05-02: 0.5 mL via INTRAMUSCULAR

## 2021-05-02 MED ORDER — LIDOCAINE HCL (PF) 2 % IJ SOLN
INTRAMUSCULAR | Status: AC
  Filled 2021-05-02: qty 5

## 2021-05-02 NOTE — Discharge Instructions (Signed)
Your laceration has been repaired.  Please follow-up if symptoms of infection occur that include increased redness, swelling, pus.  Follow-up in 10 days to have sutures removed.  Tetanus has been updated. ?

## 2021-05-02 NOTE — ED Provider Notes (Signed)
?[facility] ? ? ? ?CSN: [id number] ?Arrival date & time: 05/02/21  1221 ? ? ?  ? ?History   ?Chief Complaint ?Chief Complaint  ?Patient presents with  ? Laceration  ? ? ?HPI ?Eddie Dixon is a 56 y.o. male.  ? ?Patient presents with laceration to right index finger that occurred yesterday at approximately 12:30 PM.  Patient states that he was working with metal objects in a car when he accidentally slid his finger across a piece of metal.  Denies any numbness or tingling and patient has full range of motion of finger.  Patient reports that he has washed the finger and used antibiotic ointment.  Last tetanus vaccine is unknown per patient.  Patient reports that he does take Eliquis for atrial fibrillation but has not had much bleeding.  He reports intermittent bleeding at times since injury occurred. ? ? ?Laceration ? ?Past Medical History:  ?Diagnosis Date  ? A-fib ([place])   ? Anxiety   ? ED (erectile dysfunction)   ? Elevated blood pressure   ? ? ?Patient Active Problem List  ? Diagnosis Date Noted  ? History of cardioembolic cerebrovascular accident (CVA) 03/09/2021  ? Cerebral thrombosis with cerebral infarction 02/21/2021  ? New onset of congestive heart failure ([place]) 02/13/2021  ? Acute HFrEF (heart failure with reduced ejection fraction) ([place]) 02/13/2021  ? AKI (acute kidney injury) ([place]) 02/13/2021  ? Alcohol use 02/13/2021  ? Paresthesia 01/27/2021  ? Family history of early CAD 01/27/2021  ? Atrial fibrillation with RVR ([place]) 01/27/2021  ? Urinary tract infection 01/27/2021  ? Insomnia 11/16/2020  ? Exposure to STD 06/13/2017  ? Well adult exam 06/20/2012  ? Cerumen impaction 06/20/2012  ? ABNORMAL GLUCOSE NEC 02/01/2008  ? ANXIETY 04/05/2007  ? TOBACCO USE DISORDER/SMOKER-SMOKING CESSATION DISCUSSED 04/05/2007  ? ERECTILE DYSFUNCTION 12/01/2006  ? ? ?Past Surgical History:  ?Procedure Laterality Date  ? BUBBLE STUDY  02/16/2021  ? Procedure: BUBBLE STUDY;  Surgeon: Berniece Salines, DO;  Location: [facility];  Service: Cardiovascular;;  ? RIGHT/LEFT HEART CATH AND CORONARY ANGIOGRAPHY N/A 02/19/2021  ? Procedure: RIGHT/LEFT HEART CATH AND CORONARY ANGIOGRAPHY;  Surgeon: Jolaine Artist, MD;  Location: [facility];  Service: Cardiovascular;  Laterality: N/A;  ? TEE WITHOUT CARDIOVERSION N/A 02/16/2021  ? Procedure: TRANSESOPHAGEAL ECHOCARDIOGRAM (TEE);  Surgeon: Berniece Salines, DO;  Location: [facility];  Service: Cardiovascular;  Laterality: N/A;  ? ? ? ? ? ?Home Medications   ? ?Prior to Admission medications   ?Medication Sig Start Date End Date Taking? Authorizing Provider  ?amiodarone (PACERONE) 200 MG tablet Take 1 tablet (200 mg total) by mouth daily. 04/12/21   Rafael Bihari, FNP  ?apixaban (ELIQUIS) 5 MG TABS tablet Take 1 tablet (5 mg total) by mouth 2 (two) times daily. 02/23/21   Danford, Suann Larry, MD  ?atorvastatin (LIPITOR) 40 MG tablet Take 1 tablet (40 mg total) by mouth daily. 02/24/21   Danford, Suann Larry, MD  ?carvedilol (COREG) 6.25 MG tablet Take 1 tablet (6.25 mg total) by mouth 2 (two) times daily with a meal. 02/23/21   Danford, Suann Larry, MD  ?Cholecalciferol (VITAMIN D3) 50 MCG (2000 UT) capsule Take 1 capsule (2,000 Units total) by mouth daily. 01/27/21   Plotnikov, Evie Lacks, MD  ?dapagliflozin propanediol (FARXIGA) 10 MG TABS tablet Take 1 tablet (10 mg total) by mouth daily. 02/24/21   Danford, Suann Larry, MD  ?digoxin (LANOXIN) 0.125 MG tablet Take 0.5 tablets (0.0625 mg total) by mouth  daily. 04/21/21   Rafael Bihari, FNP  ?losartan (COZAAR) 25 MG tablet Take 1 tablet (25 mg total) by mouth daily. 04/12/21   Rafael Bihari, FNP  ?Multiple Vitamins-Minerals (MULTIVITAMIN ADULTS) TABS Take 1 tablet by mouth daily.    [provider]  ?spironolactone (ALDACTONE) 25 MG tablet Take 1/2 tablet (12.5 mg total) by mouth daily. 02/24/21   Danford, Suann Larry, MD  ?torsemide (DEMADEX) 20 MG tablet Take 1 tablet (20 mg total) by mouth daily as needed  (for weight gain > 3 lb in 24 hrs or 5 lb over dry weight). 02/23/21   Danford, Suann Larry, MD  ?traZODone (DESYREL) 50 MG tablet Take 0.5-1 tablets (25-50 mg total) by mouth at bedtime as needed for sleep. 11/16/20   Plotnikov, Evie Lacks, MD  ? ? ?Family History ?Family History  ?Problem Relation Age of Onset  ? Heart disease Father 67  ?     MI  ? ? ?Social History ?Social History  ? ?Tobacco Use  ? Smoking status: Never  ? Smokeless tobacco: Current  ?  Types: Chew  ?Vaping Use  ? Vaping Use: Never used  ?Substance Use Topics  ? Alcohol use: Yes  ?  Alcohol/week: 17.0 standard drinks  ?  Types: 17 Shots of liquor per week  ?  Comment: drinks fifth of liquor per week x 1 year.  ? Drug use: No  ? ? ? ?Allergies   ?Patient has no known allergies. ? ? ?Review of Systems ?Review of Systems ?Per HPI ? ?Physical Exam ?Triage Vital Signs ?ED Triage Vitals  ?Enc Vitals Group  ?   BP 05/02/21 1251 118/72  ?   Pulse Rate 05/02/21 1251 66  ?   Resp 05/02/21 1251 17  ?   Temp 05/02/21 1251 98 ?F (36.7 ?C)  ?   Temp Source 05/02/21 1251 Oral  ?   SpO2 05/02/21 1251 97 %  ?   Weight --   ?   Height --   ?   Head Circumference --   ?   Peak Flow --   ?   Pain Score 05/02/21 1252 0  ?   Pain Loc --   ?   Pain Edu? --   ?   Excl. in [place]? --   ? ?No data found. ? ?Updated Vital Signs ?BP 118/72   Pulse 66   Temp 98 ?F (36.7 ?C) (Oral)   Resp 17   SpO2 97%  ? ?Visual Acuity ?Right Eye Distance:   ?Left Eye Distance:   ?Bilateral Distance:   ? ?Right Eye Near:   ?Left Eye Near:    ?Bilateral Near:    ? ?Physical Exam ?Constitutional:   ?   General: He is not in acute distress. ?   Appearance: Normal appearance. He is not toxic-appearing or diaphoretic.  ?HENT:  ?   Head: Normocephalic and atraumatic.  ?Eyes:  ?   Extraocular Movements: Extraocular movements intact.  ?   Conjunctiva/sclera: Conjunctivae normal.  ?Pulmonary:  ?   Effort: Pulmonary effort is normal.  ?Skin: ?   Findings: Laceration present.  ?   Comments:  Approximately 2.5 cm curvilinear laceration present to right dorsal surface of index finger overlying joint between middle and proximal phalanx.  Patient has full range of motion of finger.  Bleeding is controlled.  Neurovascular intact.  No tendons or bones seen with full visualization of depth of wound. Laceration is very superficial.  ?Neurological:  ?  General: No focal deficit present.  ?   Mental Status: He is alert and oriented to person, place, and time. Mental status is at baseline.  ?Psychiatric:     ?   Mood and Affect: Mood normal.     ?   Behavior: Behavior normal.     ?   Thought Content: Thought content normal.     ?   Judgment: Judgment normal.  ? ? ? ?UC Treatments / Results  ?Labs ?(all labs ordered are listed, but only abnormal results are displayed) ?Labs Reviewed - No data to display ? ?EKG ? ? ?Radiology ?No results found. ? ?Procedures ?Laceration Repair ? ?Date/Time: 05/02/2021 1:49 PM ?Performed by: Teodora Medici, FNP ?Authorized by: Teodora Medici, FNP  ? ?Consent:  ?  Consent obtained:  Verbal ?  Consent given by:  Patient ?  Risks, benefits, and alternatives were discussed: yes   ?  Risks discussed:  Infection and pain ?  Alternatives discussed:  No treatment, delayed treatment and observation ?Universal protocol:  ?  Procedure explained and questions answered to patient or proxy's satisfaction: yes   ?  Site/side marked: yes   ?  Immediately prior to procedure, a time out was called: yes   ?  Patient identity confirmed:  Verbally with patient and arm band ?Anesthesia:  ?  Anesthesia method:  Local infiltration ?  Local anesthetic:  Lidocaine 1% w/o epi ?Laceration details:  ?  Location:  Finger ?  Finger location:  R index finger ?  Length (cm):  2.5 ?Pre-procedure details:  ?  Preparation:  Patient was prepped and draped in usual sterile fashion ?Exploration:  ?  Imaging outcome: foreign body not noted   ?  Wound exploration: wound explored through full range of motion and entire depth  of wound visualized   ?Treatment:  ?  Area cleansed with:  Povidone-iodine and saline ?  Amount of cleaning:  Standard ?Skin repair:  ?  Repair method:  Sutures ?  Suture size:  4-0 ?  Suture material:  Nylon ?  Sutu

## 2021-05-02 NOTE — ED Triage Notes (Signed)
Pt is present today with a laceration on his right pointer finger. Pt states hat he cut his finger with a piece of medal at work yesterday. Pt states that the lac is still bleeding due to him taking blood thinners. Denies any pain ?

## 2021-05-15 ENCOUNTER — Other Ambulatory Visit: Payer: Self-pay

## 2021-05-15 ENCOUNTER — Encounter (HOSPITAL_COMMUNITY): Payer: Self-pay | Admitting: Emergency Medicine

## 2021-05-15 ENCOUNTER — Ambulatory Visit (HOSPITAL_COMMUNITY)
Admission: EM | Admit: 2021-05-15 | Discharge: 2021-05-15 | Disposition: A | Discharge status: Home / Self Care | Payer: Managed Care, Other (non HMO)

## 2021-05-15 DIAGNOSIS — S61210D Laceration without foreign body of right index finger without damage to nail, subsequent encounter: Secondary | ICD-10-CM | POA: Diagnosis not present

## 2021-05-15 DIAGNOSIS — Z4802 Encounter for removal of sutures: Secondary | ICD-10-CM | POA: Diagnosis not present

## 2021-05-15 NOTE — ED Notes (Addendum)
Patient's wound to right index finger appears healed, but edges of wound are not together.  It appears sutures are popped.  Patient does move right index finger, appears slightly swollen.  Notified haley, pa ?

## 2021-05-15 NOTE — ED Triage Notes (Signed)
Patient seen 4/9 for a laceration to right index finger.  Per provider note, 7 sutures placed in finger.  Patient is here today to have sutures removed ?

## 2021-05-15 NOTE — ED Provider Notes (Signed)
?[facility] ? ? ? ?CSN: [id number] ?Arrival date & time: 05/15/21  1615 ? ? ?  ? ?History   ?Chief Complaint ?Chief Complaint  ?Patient presents with  ? Suture / Staple Removal  ? ? ?HPI ?Eddie Dixon is a 56 y.o. male.  ? ?Patient presents for suture removal to index finger.  Laceration was repaired on 05/02/2021 with 7 sutures.  Called to room by nurse while trying to take the sutures out as nurse reported that it appears that most of them have been removed and the other ones that are remaining are untied.  Patient denies taking them out himself.  Patient reports that he noticed that he started untying on their own.  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 ? ? ?Suture / Staple Removal ? ? ?Past Medical History:  ?Diagnosis Date  ? A-fib ([place])   ? Anxiety   ? ED (erectile dysfunction)   ? Elevated blood pressure   ? ? ?Patient Active Problem List  ? Diagnosis Date Noted  ? History of cardioembolic cerebrovascular accident (CVA) 03/09/2021  ? Cerebral thrombosis with cerebral infarction 02/21/2021  ? New onset of congestive heart failure ([place]) 02/13/2021  ? Acute HFrEF (heart failure with reduced ejection fraction) ([place]) 02/13/2021  ? AKI (acute kidney injury) ([place]) 02/13/2021  ? Alcohol use 02/13/2021  ? Paresthesia 01/27/2021  ? Family history of early CAD 01/27/2021  ? Atrial fibrillation with RVR ([place]) 01/27/2021  ? Urinary tract infection 01/27/2021  ? Insomnia 11/16/2020  ? Exposure to STD 06/13/2017  ? Well adult exam 06/20/2012  ? Cerumen impaction 06/20/2012  ? ABNORMAL GLUCOSE NEC 02/01/2008  ? ANXIETY 04/05/2007  ? TOBACCO USE DISORDER/SMOKER-SMOKING CESSATION DISCUSSED 04/05/2007  ? ERECTILE DYSFUNCTION 12/01/2006  ? ? ?Past Surgical History:  ?Procedure Laterality Date  ? BUBBLE STUDY  02/16/2021  ? Procedure: BUBBLE STUDY;  Surgeon: Berniece Salines, DO;  Location: [facility];  Service: Cardiovascular;;  ? RIGHT/LEFT HEART CATH AND CORONARY ANGIOGRAPHY  N/A 02/19/2021  ? Procedure: RIGHT/LEFT HEART CATH AND CORONARY ANGIOGRAPHY;  Surgeon: Jolaine Artist, MD;  Location: [facility];  Service: Cardiovascular;  Laterality: N/A;  ? TEE WITHOUT CARDIOVERSION N/A 02/16/2021  ? Procedure: TRANSESOPHAGEAL ECHOCARDIOGRAM (TEE);  Surgeon: Berniece Salines, DO;  Location: [facility];  Service: Cardiovascular;  Laterality: N/A;  ? ? ? ? ? ?Home Medications   ? ?Prior to Admission medications   ?Medication Sig Start Date End Date Taking? Authorizing Provider  ?amiodarone (PACERONE) 200 MG tablet Take 1 tablet (200 mg total) by mouth daily. 04/12/21   Rafael Bihari, FNP  ?apixaban (ELIQUIS) 5 MG TABS tablet Take 1 tablet (5 mg total) by mouth 2 (two) times daily. 02/23/21   Danford, Suann Larry, MD  ?atorvastatin (LIPITOR) 40 MG tablet Take 1 tablet (40 mg total) by mouth daily. 02/24/21   Danford, Suann Larry, MD  ?carvedilol (COREG) 6.25 MG tablet Take 1 tablet (6.25 mg total) by mouth 2 (two) times daily with a meal. 02/23/21   Danford, Suann Larry, MD  ?Cholecalciferol (VITAMIN D3) 50 MCG (2000 UT) capsule Take 1 capsule (2,000 Units total) by mouth daily. 01/27/21   Plotnikov, Evie Lacks, MD  ?dapagliflozin propanediol (FARXIGA) 10 MG TABS tablet Take 1 tablet (10 mg total) by mouth daily. 02/24/21   Danford, Suann Larry, MD  ?digoxin (LANOXIN) 0.125 MG tablet Take 0.5 tablets (0.0625 mg total) by mouth daily. 04/21/21   Milford,  Maricela Bo, FNP  ?losartan (COZAAR) 25 MG tablet Take 1 tablet (25 mg total) by mouth daily. 04/12/21   Rafael Bihari, FNP  ?Multiple Vitamins-Minerals (MULTIVITAMIN ADULTS) TABS Take 1 tablet by mouth daily.    [provider]  ?spironolactone (ALDACTONE) 25 MG tablet Take 1/2 tablet (12.5 mg total) by mouth daily. 02/24/21   Danford, Suann Larry, MD  ?torsemide (DEMADEX) 20 MG tablet Take 1 tablet (20 mg total) by mouth daily as needed (for weight gain > 3 lb in 24 hrs or 5 lb over dry weight). 02/23/21   Danford, Suann Larry, MD  ?traZODone (DESYREL) 50 MG tablet Take 0.5-1 tablets (25-50 mg total) by mouth at bedtime as needed for sleep. 11/16/20   Plotnikov, Evie Lacks, MD  ? ? ?Family History ?Family History  ?Problem Relation Age of Onset  ? Heart disease Father 73  ?     MI  ? ? ?Social History ?Social History  ? ?Tobacco Use  ? Smoking status: Never  ? Smokeless tobacco: Current  ?  Types: Chew  ?Vaping Use  ? Vaping Use: Never used  ?Substance Use Topics  ? Alcohol use: Yes  ?  Alcohol/week: 17.0 standard drinks  ?  Types: 17 Shots of liquor per week  ?  Comment: drinks fifth of liquor per week x 1 year.  ? Drug use: No  ? ? ? ?Allergies   ?Patient has no known allergies. ? ? ?Review of Systems ?Review of Systems ?Per HPI ? ?Physical Exam ?Triage Vital Signs ?ED Triage Vitals [05/15/21 1717]  ?Enc Vitals Group  ?   BP   ?   Pulse   ?   Resp   ?   Temp   ?   Temp src   ?   SpO2   ?   Weight   ?   Height   ?   Head Circumference   ?   Peak Flow   ?   Pain Score 0  ?   Pain Loc   ?   Pain Edu?   ?   Excl. in [place]?   ? ?No data found. ? ?Updated Vital Signs ?There were no vitals taken for this visit. ? ?Visual Acuity ?Right Eye Distance:   ?Left Eye Distance:   ?Bilateral Distance:   ? ?Right Eye Near:   ?Left Eye Near:    ?Bilateral Near:    ? ?Physical Exam ?Constitutional:   ?   Appearance: Normal appearance.  ?HENT:  ?   Head: Normocephalic and atraumatic.  ?Eyes:  ?   Extraocular Movements: Extraocular movements intact.  ?   Conjunctiva/sclera: Conjunctivae normal.  ?Pulmonary:  ?   Effort: Pulmonary effort is normal.  ?Skin: ?   Comments: 3 untied sutures present to previous laceration.  No other sutures or remnants of sutures noted.  No signs of infection and laceration is healing well.  ?Neurological:  ?   General: No focal deficit present.  ?   Mental Status: He is alert and oriented to person, place, and time. Mental status is at baseline.  ?Psychiatric:     ?   Mood and Affect: Mood normal.     ?   Behavior: Behavior  normal.     ?   Thought Content: Thought content normal.     ?   Judgment: Judgment normal.  ? ? ? ?UC Treatments / Results  ?Labs ?(all labs ordered are listed, but only abnormal results are displayed) ?  Labs Reviewed - No data to display ? ?EKG ? ? ?Radiology ?No results found. ? ?Procedures ?Procedures (including critical care time) ? ?Medications Ordered in UC ?Medications - No data to display ? ?Initial Impression / Assessment and Plan / UC Course  ?I have reviewed the triage vital signs and the nursing notes. ? ?Pertinent labs & imaging results that were available during my care of the patient were reviewed by me and considered in my medical decision making (see chart for details). ? ?  ? ?Three sutures were removed.  No other sutures noted or remnants of sutures.  No signs of infection.  It appears to be healing well.  Discussed strict return precautions with patient.  Patient verbalized understanding and was agreeable with plan. ?Final Clinical Impressions(s) / UC Diagnoses  ? ?Final diagnoses:  ?Encounter for removal of sutures  ? ?Discharge Instructions   ?None ?  ? ?ED Prescriptions   ?None ?  ? ?PDMP not reviewed this encounter. ?  ?Teodora Medici, [place] ?05/15/21 1732 ? ?

## 2021-05-15 NOTE — ED Notes (Signed)
Provider removed sutures

## 2021-05-24 ENCOUNTER — Encounter: Payer: Self-pay | Admitting: Neurology

## 2021-05-24 ENCOUNTER — Ambulatory Visit (INDEPENDENT_AMBULATORY_CARE_PROVIDER_SITE_OTHER): Payer: Managed Care, Other (non HMO) | Admitting: Neurology

## 2021-05-24 VITALS — BP 170/95 | HR 52 | Ht 68.0 in | Wt 232.0 lb

## 2021-05-24 DIAGNOSIS — I480 Paroxysmal atrial fibrillation: Secondary | ICD-10-CM | POA: Diagnosis not present

## 2021-05-24 DIAGNOSIS — Z8673 Personal history of transient ischemic attack (TIA), and cerebral infarction without residual deficits: Secondary | ICD-10-CM | POA: Diagnosis not present

## 2021-05-24 DIAGNOSIS — I420 Dilated cardiomyopathy: Secondary | ICD-10-CM | POA: Diagnosis not present

## 2021-05-24 NOTE — Patient Instructions (Signed)
I had a long d/w patient about his recent cardioembolic stroke from atrial fibrillation and cardiomyopathy, risk for recurrent stroke/TIAs, personally independently reviewed imaging studies and stroke evaluation results and answered questions.Continue Eliquis (apixaban) 5 mg twice daily  for secondary stroke prevention and maintain strict control of hypertension with blood pressure goal below 130/90, diabetes with hemoglobin A1c goal below 6.5% and lipids with LDL cholesterol goal below 70 mg/dL. I also advised the patient to eat a healthy diet with plenty of whole grains, cereals, fruits and vegetables, exercise regularly and maintain ideal body weight .plan to check follow-up lipid profile today.  Patient advised to keep scheduled follow-up with his cardiologist for his heart failure and A-fib.  Followup in the future with me in 6 months or call earlier if necessary. ?Stroke Prevention ?Some medical conditions and behaviors can lead to a higher chance of having a stroke. You can help prevent a stroke by eating healthy, exercising, not smoking, and managing any medical conditions you have. ?Stroke is a leading cause of functional impairment. Primary prevention is particularly important because a majority of strokes are first-time events. Stroke changes the lives of not only those who experience a stroke but also their family and other caregivers. ?How can this condition affect me? ?A stroke is a medical emergency and should be treated right away. A stroke can lead to brain damage and can sometimes be life-threatening. If a person gets medical treatment right away, there is a better chance of surviving and recovering from a stroke. ?What can increase my risk? ?The following medical conditions may increase your risk of a stroke: ?Cardiovascular disease. ?High blood pressure (hypertension). ?Diabetes. ?High cholesterol. ?Sickle cell disease. ?Blood clotting disorders (hypercoagulable state). ?Obesity. ?Sleep disorders  (obstructive sleep apnea). ?Other risk factors include: ?Being older than age 62. ?Having a history of blood clots, stroke, or mini-stroke (transient ischemic attack, TIA). ?Genetic factors, such as race, ethnicity, or a family history of stroke. ?Smoking cigarettes or using other tobacco products. ?Taking birth control pills, especially if you also use tobacco. ?Heavy use of alcohol or drugs, especially cocaine and methamphetamine. ?Physical inactivity. ?What actions can I take to prevent this? ?Manage your health conditions ?High cholesterol levels. ?Eating a healthy diet is important for preventing high cholesterol. If cholesterol cannot be managed through diet alone, you may need to take medicines. ?Take any prescribed medicines to control your cholesterol as told by your health care provider. ?Hypertension. ?To reduce your risk of stroke, try to keep your blood pressure below 130/80. ?Eating a healthy diet and exercising regularly are important for controlling blood pressure. If these steps are not enough to manage your blood pressure, you may need to take medicines. ?Take any prescribed medicines to control hypertension as told by your health care provider. ?Ask your health care provider if you should monitor your blood pressure at home. ?Have your blood pressure checked every year, even if your blood pressure is normal. Blood pressure increases with age and some medical conditions. ?Diabetes. ?Eating a healthy diet and exercising regularly are important parts of managing your blood sugar (glucose). If your blood sugar cannot be managed through diet and exercise, you may need to take medicines. ?Take any prescribed medicines to control your diabetes as told by your health care provider. ?Get evaluated for obstructive sleep apnea. Talk to your health care provider about getting a sleep evaluation if you snore a lot or have excessive sleepiness. ?Make sure that any other medical conditions you have, such as  atrial fibrillation or atherosclerosis, are managed. ?Nutrition ?Follow instructions from your health care provider about what to eat or drink to help manage your health condition. These instructions may include: ?Reducing your daily calorie intake. ?Limiting how much salt (sodium) you use to 1,500 milligrams (mg) each day. ?Using only healthy fats for cooking, such as olive oil, canola oil, or sunflower oil. ?Eating healthy foods. You can do this by: ?Choosing foods that are high in fiber, such as whole grains, and fresh fruits and vegetables. ?Eating at least 5 servings of fruits and vegetables a day. Try to fill one-half of your plate with fruits and vegetables at each meal. ?Choosing lean protein foods, such as lean cuts of meat, poultry without skin, fish, tofu, beans, and nuts. ?Eating low-fat dairy products. ?Avoiding foods that are high in sodium. This can help lower blood pressure. ?Avoiding foods that have saturated fat, trans fat, and cholesterol. This can help prevent high cholesterol. ?Avoiding processed and prepared foods. ?Counting your daily carbohydrate intake. ? ?Lifestyle ?If you drink alcohol: ?Limit how much you have to: ?0-1 drink a day for women who are not pregnant. ?0-2 drinks a day for men. ?Know how much alcohol is in your drink. In the [place] one drink equals one 12 oz bottle of beer (375m), one 5 oz glass of wine (1442m, or one 1? oz glass of hard liquor (4473m ?Do not use any products that contain nicotine or tobacco. These products include cigarettes, chewing tobacco, and vaping devices, such as e-cigarettes. If you need help quitting, ask your health care provider. ?Avoid secondhand smoke. ?Do not use drugs. ?Activity ? ?Try to stay at a healthy weight. ?Get at least 30 minutes of exercise on most days, such as: ?Fast walking. ?Biking. ?Swimming. ?Medicines ?Take over-the-counter and prescription medicines only as told by your health care provider. Aspirin or blood thinners  (antiplatelets or anticoagulants) may be recommended to reduce your risk of forming blood clots that can lead to stroke. ?Avoid taking birth control pills. Talk to your health care provider about the risks of taking birth control pills if: ?You are over 35 24ars old. ?You smoke. ?You get very bad headaches. ?You have had a blood clot. ?Where to find more information ?American Stroke Association: www.strokeassociation.org ?Get help right away if: ?You or a loved one has any symptoms of a stroke. "BE FAST" is an easy way to remember the main warning signs of a stroke: ?B - Balance. Signs are dizziness, sudden trouble walking, or loss of balance. ?E - Eyes. Signs are trouble seeing or a sudden change in vision. ?F - Face. Signs are sudden weakness or numbness of the face, or the face or eyelid drooping on one side. ?A - Arms. Signs are weakness or numbness in an arm. This happens suddenly and usually on one side of the body. ?S - Speech. Signs are sudden trouble speaking, slurred speech, or trouble understanding what people say. ?T - Time. Time to call emergency services. Write down what time symptoms started. ?You or a loved one has other signs of a stroke, such as: ?A sudden, severe headache with no known cause. ?Nausea or vomiting. ?Seizure. ?These symptoms may represent a serious problem that is an emergency. Do not wait to see if the symptoms will go away. Get medical help right away. Call your local emergency services (911 in the [place] Do not drive yourself to the hospital. ?Summary ?You can help to prevent a stroke by eating healthy, exercising, not smoking,  limiting alcohol intake, and managing any medical conditions you may have. ?Do not use any products that contain nicotine or tobacco. These include cigarettes, chewing tobacco, and vaping devices, such as e-cigarettes. If you need help quitting, ask your health care provider. ?Remember "BE FAST" for warning signs of a stroke. Get help right away if you or a  loved one has any of these signs. ?This information is not intended to replace advice given to you by your health care provider. Make sure you discuss any questions you have with your health care provider. ?Doc

## 2021-05-24 NOTE — Progress Notes (Signed)
?[facility] ?[address] ?[place]. [place] [address] ?[phone] ? ?     OFFICE FOLLOW-UP NOTE ? ?[name] ?Date of Birth:  10/28/1965 ?Medical Record Number:  [id number]  ? ?HPI: [name] is a 56 year old male seen today for initial office follow-up visit following hospital admission for stroke in January 2023.  History is obtained from the patient and review of electronic medical records and personally reviewed pertinent available imaging films in PACS.  He has past medical history of chronic atrial fibrillation on Xarelto, congestive heart failure who is admitted with A-fib with rapid ventricular rate, acute kidney injury and lower extremity edema.  He had shortly been started on Xarelto prior to this but had not been taking it due to the cost.  He was in the hospital and had recently undergone a TEE which had shown left atrial appendage clot and hence was not cardioverted.  He was on IV heparin.  He developed sudden onset of right-sided weakness and slurred speech.  He was seen by neuro hospitalist on-call and had an NIH of 9.  He was not a candidate for TNK due to being on heparin and no LVO was found on the CT angio which showed no large vessel stenosis or occlusion.  MRI scan of the brain showed high left parietal cortical as well as coronary radiator embolic infarcts.  2D echo had shown ejection fraction of 20-25% and TEE ejection fraction of 30/35% with positive bubble study.  Cardiac MRI on 02/19/2021 showed LAA thrombus present with mild biatrial enlargement and small possible PFO.  Moderate global hypokinesis with ejection fraction 21%.  LDL cholesterol was 93 mg percent hemoglobin A1c was 5.6.  Patient's weakness improved shortly thereafter during the hospitalization itself.  He was switched to Eliquis and Lipitor was added.  Patient states is done well since discharge.  He is made full recovery.  He has had no recurrent stroke or TIA symptoms.  He is tolerating Eliquis  well without bleeding or bruising.  Blood pressure is usually well controlled at home though today it is elevated in office at 170/95.  He remains on Lipitor 40 mg daily which is tolerating well without muscle aches and pains.  He has not had a follow-up lipid profile checked.  He has no new complaints today.  He did follow-up with his cardiologist Dr. Gwenlyn Found in February. ? ?ROS:   ?14 system review of systems is positive for slurred speech, facial weakness, arm weakness and all other systems negative ? ?PMH:  ?Past Medical History:  ?Diagnosis Date  ? A-fib ([place])   ? Anxiety   ? ED (erectile dysfunction)   ? Elevated blood pressure   ? ? ?Social History:  ?Social History  ? ?Socioeconomic History  ? Marital status: Divorced  ?  Spouse name: Not on file  ? Number of children: 2  ? Years of education: Not on file  ? Highest education level: Some college, no degree  ?Occupational History  ? Occupation: Librarian, academic  ?  Comment: [employer]  ?Tobacco Use  ? Smoking status: Never  ? Smokeless tobacco: Current  ?  Types: Chew  ?Vaping Use  ? Vaping Use: Never used  ?Substance and Sexual Activity  ? Alcohol use: Yes  ?  Alcohol/week: 17.0 standard drinks  ?  Types: 17 Shots of liquor per week  ?  Comment: drinks fifth of liquor per week x 1 year.  ? Drug use: No  ? Sexual activity: Yes  ?  Other Topics Concern  ? Not on file  ?Social History Narrative  ? Regular Exercise -  YES  ?   ?   ? ?Social Determinants of Health  ? ?Financial Resource Strain: High Risk  ? Difficulty of Paying Living Expenses: Hard  ?Food Insecurity: No Food Insecurity  ? Worried About Charity fundraiser in the Last Year: Never true  ? Ran Out of Food in the Last Year: Never true  ?Transportation Needs: No Transportation Needs  ? Lack of Transportation (Medical): No  ? Lack of Transportation (Non-Medical): No  ?Physical Activity: Not on file  ?Stress: Not on file  ?Social Connections: Not on file  ?Intimate Partner Violence: Not on file  ? ? ?Medications:    ?Current Outpatient Medications on File Prior to Visit  ?Medication Sig Dispense Refill  ? amiodarone (PACERONE) 200 MG tablet Take 1 tablet (200 mg total) by mouth daily. 30 tablet 5  ? apixaban (ELIQUIS) 5 MG TABS tablet Take 1 tablet (5 mg total) by mouth 2 (two) times daily. 60 tablet 3  ? atorvastatin (LIPITOR) 40 MG tablet Take 1 tablet (40 mg total) by mouth daily. 30 tablet 3  ? carvedilol (COREG) 6.25 MG tablet Take 1 tablet (6.25 mg total) by mouth 2 (two) times daily with a meal. 60 tablet 3  ? Cholecalciferol (VITAMIN D3) 50 MCG (2000 UT) capsule Take 1 capsule (2,000 Units total) by mouth daily. 100 capsule 3  ? dapagliflozin propanediol (FARXIGA) 10 MG TABS tablet Take 1 tablet (10 mg total) by mouth daily. 30 tablet 3  ? digoxin (LANOXIN) 0.125 MG tablet Take 0.5 tablets (0.0625 mg total) by mouth daily. 30 tablet 3  ? losartan (COZAAR) 25 MG tablet Take 1 tablet (25 mg total) by mouth daily. 30 tablet 4  ? Multiple Vitamins-Minerals (MULTIVITAMIN ADULTS) TABS Take 1 tablet by mouth daily.    ? spironolactone (ALDACTONE) 25 MG tablet Take 1/2 tablet (12.5 mg total) by mouth daily. 15 tablet 3  ? torsemide (DEMADEX) 20 MG tablet Take 1 tablet (20 mg total) by mouth daily as needed (for weight gain > 3 lb in 24 hrs or 5 lb over dry weight). 30 tablet 3  ? traZODone (DESYREL) 50 MG tablet Take 0.5-1 tablets (25-50 mg total) by mouth at bedtime as needed for sleep. 30 tablet 5  ? ?No current facility-administered medications on file prior to visit.  ? ? ?Allergies:  No Known Allergies ? ?Physical Exam ?General: well developed, well nourished pleasant middle-age male, seated, in no evident distress ?Head: head normocephalic and atraumatic.  ?Neck: supple with no carotid or supraclavicular bruits ?Cardiovascular: regular rate and rhythm, no murmurs ?Musculoskeletal: no deformity ?Skin:  no rash/petichiae ?Vascular:  Normal pulses all extremities ?Vitals:  ? 05/24/21 1038  ?BP: (!) 170/95  ?Pulse: (!) 52   ? ?Neurologic Exam ?Mental Status: Awake and fully alert. Oriented to place and time. Recent and remote memory intact. Attention span, concentration and fund of knowledge appropriate. Mood and affect appropriate.  ?Cranial Nerves: Fundoscopic exam reveals sharp disc margins. Pupils equal, briskly reactive to light. Extraocular movements full without nystagmus. Visual fields full to confrontation. Hearing intact. Facial sensation intact. Face, tongue, palate moves normally and symmetrically.  ?Motor: Normal bulk and tone. Normal strength in all tested extremity muscles. ?Sensory.: intact to touch ,pinprick .position and vibratory sensation.  ?Coordination: Rapid alternating movements normal in all extremities. Finger-to-nose and heel-to-shin performed accurately bilaterally. ?Gait and Station: Arises from chair without difficulty.  Stance is normal. Gait demonstrates normal stride length and balance . Able to heel, toe and tandem walk without difficulty.  ?Reflexes: 1+ and symmetric. Toes downgoing.  ? ?NIHSS  0 ?Modified Rankin  0 ? ? ?ASSESSMENT: 56 year male with left pareital MCA branch infarct in January 99991111 of cardioembolic etiology  from AFIB and cardiomyopathy.  He is doing well without recurrent symptoms and no residual deficits. ? ? ? ? ?PLAN: I had a long d/w patient about his recent cardioembolic stroke from atrial fibrillation and cardiomyopathy, risk for recurrent stroke/TIAs, personally independently reviewed imaging studies and stroke evaluation results and answered questions.Continue Eliquis (apixaban) 5 mg twice daily  for secondary stroke prevention and maintain strict control of hypertension with blood pressure goal below 130/90, diabetes with hemoglobin A1c goal below 6.5% and lipids with LDL cholesterol goal below 70 mg/dL. I also advised the patient to eat a healthy diet with plenty of whole grains, cereals, fruits and vegetables, exercise regularly and maintain ideal body weight .plan to  check follow-up lipid profile today.  Patient advised to keep scheduled follow-up with his cardiologist for his heart failure and A-fib.  Followup in the future with me in 6 months or call earlier if necess

## 2021-05-25 LAB — LIPID PANEL
Chol/HDL Ratio: 1.9 ratio (ref 0.0–5.0)
Cholesterol, Total: 161 mg/dL (ref 100–199)
HDL: 84 mg/dL (ref >39)
LDL Chol Calc (NIH): 59 mg/dL (ref 0–99)
Triglycerides: 101 mg/dL (ref 0–149)
VLDL Cholesterol Cal: 18 mg/dL (ref 5–40)

## 2021-05-26 ENCOUNTER — Ambulatory Visit (HOSPITAL_BASED_OUTPATIENT_CLINIC_OR_DEPARTMENT_OTHER)
Admission: RE | Admit: 2021-05-26 | Discharge: 2021-05-26 | Disposition: A | Discharge status: Home / Self Care | Payer: Managed Care, Other (non HMO) | Source: Ambulatory Visit | Attending: Internal Medicine | Admitting: Internal Medicine

## 2021-05-26 ENCOUNTER — Ambulatory Visit (HOSPITAL_COMMUNITY)
Admission: RE | Admit: 2021-05-26 | Discharge: 2021-05-26 | Disposition: A | Discharge status: Home / Self Care | Payer: Managed Care, Other (non HMO) | Source: Ambulatory Visit | Attending: Internal Medicine | Admitting: Internal Medicine

## 2021-05-26 ENCOUNTER — Other Ambulatory Visit (HOSPITAL_COMMUNITY): Payer: 59

## 2021-05-26 ENCOUNTER — Encounter (HOSPITAL_COMMUNITY): Payer: Self-pay | Admitting: Internal Medicine

## 2021-05-26 VITALS — BP 160/90 | HR 50 | Wt 234.0 lb

## 2021-05-26 DIAGNOSIS — I5021 Acute systolic (congestive) heart failure: Secondary | ICD-10-CM | POA: Diagnosis not present

## 2021-05-26 DIAGNOSIS — I2510 Atherosclerotic heart disease of native coronary artery without angina pectoris: Secondary | ICD-10-CM | POA: Diagnosis not present

## 2021-05-26 DIAGNOSIS — Z79899 Other long term (current) drug therapy: Secondary | ICD-10-CM | POA: Insufficient documentation

## 2021-05-26 DIAGNOSIS — Z789 Other specified health status: Secondary | ICD-10-CM | POA: Diagnosis not present

## 2021-05-26 DIAGNOSIS — Z7901 Long term (current) use of anticoagulants: Secondary | ICD-10-CM | POA: Insufficient documentation

## 2021-05-26 DIAGNOSIS — R0609 Other forms of dyspnea: Secondary | ICD-10-CM | POA: Insufficient documentation

## 2021-05-26 DIAGNOSIS — I4891 Unspecified atrial fibrillation: Secondary | ICD-10-CM | POA: Diagnosis not present

## 2021-05-26 DIAGNOSIS — I480 Paroxysmal atrial fibrillation: Secondary | ICD-10-CM

## 2021-05-26 DIAGNOSIS — R000 Tachycardia, unspecified: Secondary | ICD-10-CM | POA: Diagnosis not present

## 2021-05-26 DIAGNOSIS — F1010 Alcohol abuse, uncomplicated: Secondary | ICD-10-CM | POA: Insufficient documentation

## 2021-05-26 DIAGNOSIS — I5022 Chronic systolic (congestive) heart failure: Secondary | ICD-10-CM | POA: Insufficient documentation

## 2021-05-26 DIAGNOSIS — Z8673 Personal history of transient ischemic attack (TIA), and cerebral infarction without residual deficits: Secondary | ICD-10-CM | POA: Insufficient documentation

## 2021-05-26 DIAGNOSIS — R0683 Snoring: Secondary | ICD-10-CM | POA: Diagnosis not present

## 2021-05-26 DIAGNOSIS — R7303 Prediabetes: Secondary | ICD-10-CM | POA: Diagnosis not present

## 2021-05-26 LAB — ECHOCARDIOGRAM COMPLETE
Area-P 1/2: 3.51 cm2
Calc EF: 57.3 %
S' Lateral: 3.7 cm
Single Plane A2C EF: 57.4 %
Single Plane A4C EF: 59.9 %

## 2021-05-26 LAB — BASIC METABOLIC PANEL
Anion gap: 8 (ref 5–15)
BUN: 14 mg/dL (ref 6–20)
CO2: 29 mmol/L (ref 22–32)
Calcium: 9.3 mg/dL (ref 8.9–10.3)
Chloride: 103 mmol/L (ref 98–111)
Creatinine, Ser: 1.09 mg/dL (ref 0.61–1.24)
GFR, Estimated: >60 mL/min (ref >60)
Glucose, Bld: 100 mg/dL — ABNORMAL HIGH (ref 70–99)
Potassium: 4.7 mmol/L (ref 3.5–5.1)
Sodium: 140 mmol/L (ref 135–145)

## 2021-05-26 MED ORDER — LOSARTAN POTASSIUM 50 MG PO TABS: 50.0000 mg | ORAL_TABLET | Freq: Every day | ORAL | 3 refills | Status: DC

## 2021-05-26 NOTE — Progress Notes (Signed)
? ?ADVANCED HF CLINIC NOTE ? ?Primary Care: Plotnikov, Evie Lacks, MD ?HF Cardiologist: Dr. Haroldine Laws ? ?HPI: ?Eddie Dixon is a 56 y.o.male with newly diagnosed CHF and Afib. Has hx of ETOH abuse. He established care with Dr. Gwenlyn Found on 01/28/21 for Afib with RVR diagnosed at PCP's office the day prior. Noted increasing dyspnea with exertion for several months. He was started on Xarelto and beta blocker.  ?  ?Admitted 1/23 with a/c CHF and with AF with RVR. Beside echo with LVEF 20-25%. Started on IV lasix and carvedilol. Initially added amiodarone gtt but later stopped d/t concern for chemical conversion with nonadherence with anticoagulation. Formal echo showed EF 20-25%, RV moderately reduced, biatrial enlargement, mild to moderate MR, moderate TR. TEE showed LAA thrombus so unable to proceed with DCCV. BP were soft with sub-optimal UOP, and AHF consulted d/t concern for low-output CHF and to assess for advanced therapies. PICC placed, Co-Ox monitored. Underwent R/LHC showing minimal CAD, well compensated filling pressures and normal output. 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 cMRI showed EF 21% RV 25%. LAA thrombus. No scar. Had RUE weakness, [facility] Stroke called. CTA head and neck normal, MRI with small acute infarcts and he was placed on Eliquis. GDMT titrated and he was discharged home, weight 222 lbs. ?  ?Hospital follow up, losartan started and DC-CV arranged, but subsequently cancelled after spontaneously converting to NSR.  ? ?Today he returns for HF follow up. Overall feeling fine. 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 Main complaint is daytime sleepiness, even if he gets 8 hrs of sleep. Feels occasional palpitations. Denies abnormal bleeding, CP, dizziness, edema, or PND/Orthopnea. Appetite ok. No fever or chills. Weight at home 230 pounds. Taking all medications. Drinks 2 beers a day, chews tobacco. Has not needed PRN torsemide.  ? ?Echo today 05/25/21 EF 50-55%, mild  LVH, grade II DD, normal RV. ? ? ?Cardiac Studies: ?- Echo (1/23): EF 20-25%, RV moderately reduced, biatrial enlargement, mild to moderate MR, moderate TR ? ?- TEE (1/23): EF 30-35%, RV mildly reduced, LAA thrombus, possible  ? ?- cMRI (1/23): LVEF 21% RV 25%. LAA thrombus. No scar. NICM Tachy mediated + ETOH.  ? ?- R/LHC (1/23): ? ?  Prox RCA lesion is 20% stenosed. ?  The left ventricular ejection fraction is less than 25% by visual estimate. ?  ?Ao = 96/73 (83) ?LV = 97/11 ?RA = 6 ?RV = 31/7 ?PA = 30/5 (22) ?PCW = 17 ?Fick cardiac output/index = 6.4/3.0 ?PVR = 0.80 WU ?SVR = 960 ?Ao sat = 95%  ?PA sat = 73%, 77%  ?SVC sat = 73% ? ?1. Minimal nonobstructive CAD ?2. Severe NICM EF ~20% ?3. Well-compensated filling pressures and normal output ? 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 ? ?Past Medical History:  ?Diagnosis Date  ? A-fib ([place])   ? Anxiety   ? ED (erectile dysfunction)   ? Elevated blood pressure   ? ?Current Outpatient Medications  ?Medication Sig Dispense Refill  ? amiodarone (PACERONE) 200 MG tablet Take 1 tablet (200 mg total) by mouth daily. 30 tablet 5  ? apixaban (ELIQUIS) 5 MG TABS tablet Take 1 tablet (5 mg total) by mouth 2 (two) times daily. 60 tablet 3  ? atorvastatin (LIPITOR) 40 MG tablet Take 1 tablet (40 mg total) by mouth daily. 30 tablet 3  ? carvedilol (COREG) 6.25 MG tablet Take 1 tablet (6.25 mg total) by mouth 2 (two) times daily with a meal. 60 tablet 3  ?  Cholecalciferol (VITAMIN D3) 50 MCG (2000 UT) capsule Take 1 capsule (2,000 Units total) by mouth daily. 100 capsule 3  ? dapagliflozin propanediol (FARXIGA) 10 MG TABS tablet Take 1 tablet (10 mg total) by mouth daily. 30 tablet 3  ? digoxin (LANOXIN) 0.125 MG tablet Take 0.5 tablets (0.0625 mg total) by mouth daily. 30 tablet 3  ? losartan (COZAAR) 25 MG tablet Take 1 tablet (25 mg total) by mouth daily. 30 tablet 4  ? Multiple Vitamins-Minerals (MULTIVITAMIN ADULTS) TABS Take 1 tablet by mouth daily.    ? spironolactone (ALDACTONE) 25 MG  tablet Take 1/2 tablet (12.5 mg total) by mouth daily. 15 tablet 3  ? torsemide (DEMADEX) 20 MG tablet Take 1 tablet (20 mg total) by mouth daily as needed (for weight gain > 3 lb in 24 hrs or 5 lb over dry weight). 30 tablet 3  ? traZODone (DESYREL) 50 MG tablet Take 0.5-1 tablets (25-50 mg total) by mouth at bedtime as needed for sleep. 30 tablet 5  ? ?No current facility-administered medications for this encounter.  ? ?No Known Allergies ? ?Social History  ? ?Socioeconomic History  ? Marital status: Divorced  ?  Spouse name: Not on file  ? Number of children: 2  ? Years of education: Not on file  ? Highest education level: Some college, no degree  ?Occupational History  ? Occupation: Librarian, academic  ?  Comment: [place] [employer]  ?Tobacco Use  ? Smoking status: Never  ? Smokeless tobacco: Current  ?  Types: Chew  ?Vaping Use  ? Vaping Use: Never used  ?Substance and Sexual Activity  ? Alcohol use: Yes  ?  Alcohol/week: 17.0 standard drinks  ?  Types: 17 Shots of liquor per week  ?  Comment: drinks fifth of liquor per week x 1 year.  ? Drug use: No  ? Sexual activity: Yes  ?Other Topics Concern  ? Not on file  ?Social History Narrative  ? Regular Exercise -  YES  ?   ?   ? ?Social Determinants of Health  ? ?Financial Resource Strain: High Risk  ? Difficulty of Paying Living Expenses: Hard  ?Food Insecurity: No Food Insecurity  ? Worried About Charity fundraiser in the Last Year: Never true  ? Ran Out of Food in the Last Year: Never true  ?Transportation Needs: No Transportation Needs  ? Lack of Transportation (Medical): No  ? Lack of Transportation (Non-Medical): No  ?Physical Activity: Not on file  ?Stress: Not on file  ?Social Connections: Not on file  ?Intimate Partner Violence: Not on file  ? ?Family History  ?Problem Relation Age of Onset  ? Heart disease Father 22  ?     MI  ? ?BP (!) 160/90   Pulse (!) 50   Wt 106.1 kg (234 lb)   SpO2 99%   BMI 35.58 kg/m?  ? ?Wt Readings from Last 3 Encounters:  ?05/26/21 106.1 kg  (234 lb)  ?05/24/21 105.2 kg (232 lb)  ?04/12/21 101.1 kg (222 lb 12.8 oz)  ? ?PHYSICAL EXAM: ?General:  NAD. No resp difficulty ?HEENT: Normal ?Neck: Supple. No JVD. Carotids 2+ bilat; no bruits. No lymphadenopathy or thryomegaly appreciated. ?Cor: PMI nondisplaced. Slow rate & rhythm. No rubs, gallops or murmurs. ?Lungs: Clear ?Abdomen: Soft, nontender, nondistended. No hepatosplenomegaly. No bruits or masses. Good bowel sounds. ?Extremities: No cyanosis, clubbing, rash, edema ?Neuro: Alert & oriented x 3, cranial nerves grossly intact. Moves all 4 extremities w/o difficulty. Affect pleasant. ? ?  ASSESSMENT & PLAN: ?Chronic systolic CHF: ?- Suspect tachymediated +- ETOH.  ?- Echo (1/23): EF 20-25%, RV moderately reduced, biatrial enlargement, mild to moderate MR, moderate TR ?- TEE (1/23): EF 30-35%, RV mildly reduced, LAA thrombus, possible  ?- cMRI (1/23): LVEF 21% RV 25%. LAA thrombus. No scar. NICM Tachy mediated + ETOH.  ?- R/LHC (1/23): minimal CAD, well compensated filling pressures and normal output.  ?- Echo today 05/26/21, EF improved 50-55% ?- NYHA II (mainly daytime fatigue), volume looks good today. He does not need daily loops.  ?- Stop digoxin with improved EF. ?- Keep torsemide 20 mg daily PRN. ?- Increase losartan to 50 mg daily for BP control. ?- Continue Coreg 6.25 mg bid.  ?- Continue spiro 12.5 mg daily ?- Continue Farxiga 10 mg daily.  ?- BMET today, repeat in 2 weeks. ?  ?2. Atrial fibrillation  ?- Newly diagnosed 2/23. In setting of ETOH use. ?- LAA thrombus noted on TEE, DCCV deferred. Spontaneously converted to NSR on amio. ?- Regular on exam today.  Discussed getting smart watch to detect AF. ?- Continue Eliquis 5 mg bid. No bleeding issues. ?- Continue amiodarone 200 mg daily for now.  ?- Sleep study has been ordered, will try to arrange today. If no OSA on sleep study, plan to stop amiodarone. ?- Continue to cut back on ETOH. ?  ?3. ETOH abuse ?- Discussed importance of cessation.  ?- He  has cut down more, now 2 beers/day. ?  ?4. Prediabetes ?- Last A1c 5.6. ?- Continue SGLT2i. ?  ?5. LAA Clot  ?- Continue Eliquis 5 mg bid. ?- As above discussed. ?  ?6. Stroke ?- MRI (1/23) new left MCA em

## 2021-05-26 NOTE — Patient Instructions (Signed)
Medication Changes: ? ?Stop Digoxin ? ?Increase Losartan to 50 mg daily  ? ?Lab Work: ? ?Labs done today, your results will be available in MyChart, we will contact you for abnormal readings. ? ? ?Testing/Procedures: ? ?Repeat blood work in 2 weeks  ? ?Your provider has recommended that you have a home sleep study.  We have provided you with the equipment in our office today. Please download the app and follow the instructions. YOUR PIN NUMBER IS: [id number]. Once you have completed the test you just dispose of the equipment, the information is automatically uploaded to [place] via blue-tooth technology. If your test is positive for sleep apnea and you need a home CPAP machine you will be contacted by Dr Theodosia Blender office [facility]) to set this up. ? ? ?Referrals: ? ?none ? ?Special Instructions // Education: ? ?none ? ?Follow-Up in: 4 months  ? ?At the [facility], you and your health needs are our priority. We have a designated team specialized in the treatment of Heart Failure. This Care Team includes your primary Heart Failure Specialized Cardiologist (physician), Advanced Practice Providers (APPs- Physician Assistants and Nurse Practitioners), and Pharmacist who all work together to provide you with the care you need, when you need it.  ? ?You may see any of the following providers on your designated Care Team at your next follow up: ? ?Dr Glori Bickers ?Dr Loralie Champagne ?Darrick Grinder, NP ?Lyda Jester, PA ?Jessica Milford,NP ?Marlyce Huge, PA ?Audry Riles, PharmD ? ? ?Please be sure to bring in all your medications bottles to every appointment.  ? ?Need to Contact us: ? ?If you have any questions or concerns before your next appointment please send [place] a message through [place] or call our office at [phone].   ? ?TO LEAVE A MESSAGE FOR THE NURSE SELECT OPTION 2, PLEASE LEAVE A MESSAGE INCLUDING: ?YOUR NAME ?DATE OF BIRTH ?CALL BACK NUMBER ?REASON FOR CALL**this is important as we  prioritize the call backs ? ?YOU WILL RECEIVE A CALL BACK THE SAME DAY AS LONG AS YOU CALL BEFORE 4:00 PM ? ? ?

## 2021-05-31 ENCOUNTER — Encounter: Payer: Self-pay | Admitting: Neurology

## 2021-06-05 ENCOUNTER — Encounter (INDEPENDENT_AMBULATORY_CARE_PROVIDER_SITE_OTHER): Payer: Managed Care, Other (non HMO) | Admitting: Cardiology

## 2021-06-05 DIAGNOSIS — I4891 Unspecified atrial fibrillation: Secondary | ICD-10-CM | POA: Diagnosis not present

## 2021-06-08 ENCOUNTER — Ambulatory Visit: Payer: Managed Care, Other (non HMO)

## 2021-06-08 DIAGNOSIS — Z789 Other specified health status: Secondary | ICD-10-CM

## 2021-06-08 DIAGNOSIS — I5021 Acute systolic (congestive) heart failure: Secondary | ICD-10-CM

## 2021-06-08 DIAGNOSIS — I4891 Unspecified atrial fibrillation: Secondary | ICD-10-CM

## 2021-06-08 NOTE — Procedures (Signed)
? ?  SLEEP STUDY REPORT ?Patient Information ?Study Date: 06/05/21 ?Patient Name: Eddie Dixon ?Patient ID: [id number] ?Birth Date: May 26, 2065 ?Age: 56 ?Gender: Male ?Referring Physician: Arvilla Meres, MD ? ?TEST DESCRIPTION: Home sleep apnea testing was completed using the WatchPat, a Type 1 device, utilizing ?peripheral arterial tonometry (PAT), chest movement, actigraphy, pulse oximetry, pulse rate, body position and snore. ?AHI was calculated with apnea and hypopnea using valid sleep time as the denominator. RDI includes apneas, ?hypopneas, and RERAs. The data acquired and the scoring of sleep and all associated events were performed in ?accordance with the recommended standards and specifications as outlined in the AASM Manual for the Scoring of ?Sleep and Associated Events 2.2.0 (2015). ? ?FINDINGS: ?1. No evidence of Obstructive Sleep Apnea with AHI 3.9/hr. ?2. No Central Sleep Apnea. ?3. Oxygen desaturations as low as 86%. ?4. Moderate snoring was present. O2 sats were < 88% for 0.6 minutes. ?5. Total sleep time was 7 hrs and 27 min. ?6. 25.1% of total sleep time was spent in REM sleep. ?7. Prolonged sleep onset latency at 31 min. ?8. Shortened REM sleep onset latency at 38 min. ?9. Total awakenings were 7. ? ?DIAGNOSIS: ?Normal study with no significant sleep disordered breathing. ? ?RECOMMENDATIONS: ?1. Normal study with no significant sleep disordered breathing. ? ?2. Healthy sleep recommendations include: adequate nightly sleep (normal 7-9 hrs/night), avoidance of caffeine after ?noon and alcohol near bedtime, and maintaining a sleep environment that is cool, dark and quiet. ? ?3. Weight loss for overweight patients is recommended. ? ?4. Snoring recommendations include: weight loss where appropriate, side sleeping, and avoidance of alcohol before ?bed. ? ?5. Operation of motor vehicle or dangerous equipment must be avoided when feeling drowsy, excessively sleepy, or ?mentally fatigued. ? ?6. An ENT  consultation which may be useful for specific causes of and possible treatment of bothersome snoring . ? ?7. Weight loss may be of benefit in reducing the severity of snoring.  ? ?Signature: ?Electronically Signed: 06/08/21 ?Armanda Magic, MD; [facility]; Diplomat, American Board of Sleep Medicine ? ?

## 2021-06-09 ENCOUNTER — Ambulatory Visit (HOSPITAL_COMMUNITY)
Admission: RE | Admit: 2021-06-09 | Discharge: 2021-06-09 | Disposition: A | Discharge status: Home / Self Care | Payer: Managed Care, Other (non HMO) | Source: Ambulatory Visit | Attending: Cardiology | Admitting: Cardiology

## 2021-06-09 ENCOUNTER — Other Ambulatory Visit: Payer: Self-pay

## 2021-06-09 ENCOUNTER — Ambulatory Visit (INDEPENDENT_AMBULATORY_CARE_PROVIDER_SITE_OTHER): Payer: Managed Care, Other (non HMO) | Admitting: Cardiovascular Disease

## 2021-06-09 ENCOUNTER — Encounter: Payer: Self-pay | Admitting: Cardiovascular Disease

## 2021-06-09 DIAGNOSIS — I5021 Acute systolic (congestive) heart failure: Secondary | ICD-10-CM

## 2021-06-09 DIAGNOSIS — Z789 Other specified health status: Secondary | ICD-10-CM

## 2021-06-09 DIAGNOSIS — I4891 Unspecified atrial fibrillation: Secondary | ICD-10-CM

## 2021-06-09 LAB — BASIC METABOLIC PANEL
Anion gap: 6 (ref 5–15)
BUN: 20 mg/dL (ref 6–20)
CO2: 29 mmol/L (ref 22–32)
Calcium: 9.7 mg/dL (ref 8.9–10.3)
Chloride: 107 mmol/L (ref 98–111)
Creatinine, Ser: 1.25 mg/dL — ABNORMAL HIGH (ref 0.61–1.24)
GFR, Estimated: >60 mL/min (ref >60)
Glucose, Bld: 99 mg/dL (ref 70–99)
Potassium: 4.6 mmol/L (ref 3.5–5.1)
Sodium: 142 mmol/L (ref 135–145)

## 2021-06-09 NOTE — Assessment & Plan Note (Signed)
Markedly reduced ethanol intake.  He now drinks 1 pint of bourbon every other week and a beer a day. ?

## 2021-06-09 NOTE — Patient Instructions (Signed)

## 2021-06-09 NOTE — Assessment & Plan Note (Signed)
History of nonischemic cardiomyopathy with an EF initially in the 25% range.  He underwent right left heart cath by Dr. Gala Romney revealing clean coronary arteries.  He was diuresed and placed on optimal medical therapy.  His EF has improved up to normal by recent 2D echo performed 05/26/2021.  He does feel clinically improved. ?

## 2021-06-09 NOTE — Assessment & Plan Note (Signed)
History of PAF probably related to ethanol abuse currently in sinus rhythm on amiodarone and Eliquis.  He converted spontaneously.  He was not cardioverted because of left atrial thrombus seen on transesophageal echo. ?

## 2021-06-09 NOTE — Progress Notes (Signed)
? ? ? ?06/09/2021 ?[name]   ?Jul 05, 1965  ?[id number] ? ?Primary Physician Plotnikov, Evie Lacks, MD ?Primary Cardiologist: Lorretta Harp MD [name], [place] ? ?HPI:  Eddie Dixon is a 55 y.o.  mildly overweight married male father of 2 children who works as a Engineer, manufacturing systems.  He works second shift.  He was referred by Dr. Alain Marion, his PCP, for evaluation of newly recognized A. fib with RVR.  I last saw him in the office 03/12/2021.  He has no cardiac risk factors other than a premature family history of CAD.  His father died of a heart attack at age 31.  He has never had a heart attack or stroke.  He has complaints of dyspnea on exertion over the last several months.  He was found to be in A. fib yesterday by Dr. Alain Marion who began him on a low-dose beta-blocker and Xarelto. The CHA2DSVASC2 score is  0 . ? ?He was admitted to the hospital approximately 2 weeks after he saw me in heart failure.  He was diuresed and underwent right left heart cath by Dr. Haroldine Laws revealing no evidence of CAD.  He underwent TEE guided attempted cardioversion by Dr. Johnsie Cancel who demonstrated a left atrial appendage thrombus and therefore cardioversion was not performed.  He was placed on amiodarone and guideline directed optimal medical therapy.  He has limited his ethanol intake and has been more compliant with his medications.  He feels clinically improved.  He is scheduled for cardioversion by Dr. Haroldine Laws on 03/26/2021. ? ?He spontaneously converted to sinus rhythm on amiodarone.  He remains on Eliquis.  He denies chest pain or shortness of breath.  His most recent echo performed 05/26/2021 revealed normalization of his LV function with an EF in the 50 to 55% range, grade 2 diastolic dysfunction.  He has limited his salt intake as well.  He has not had to take as needed torsemide for weight gain. ? ? ?Current Meds  ?Medication Sig  ? amiodarone (PACERONE) 200 MG tablet Take 1 tablet (200 mg total) by mouth daily.   ? apixaban (ELIQUIS) 5 MG TABS tablet Take 1 tablet (5 mg total) by mouth 2 (two) times daily.  ? atorvastatin (LIPITOR) 40 MG tablet Take 1 tablet (40 mg total) by mouth daily.  ? carvedilol (COREG) 6.25 MG tablet Take 1 tablet (6.25 mg total) by mouth 2 (two) times daily with a meal.  ? Cholecalciferol (VITAMIN D3) 50 MCG (2000 UT) capsule Take 1 capsule (2,000 Units total) by mouth daily.  ? dapagliflozin propanediol (FARXIGA) 10 MG TABS tablet Take 1 tablet (10 mg total) by mouth daily.  ? losartan (COZAAR) 50 MG tablet Take 1 tablet (50 mg total) by mouth daily.  ? Multiple Vitamins-Minerals (MULTIVITAMIN ADULTS) TABS Take 1 tablet by mouth daily.  ? spironolactone (ALDACTONE) 25 MG tablet Take 1/2 tablet (12.5 mg total) by mouth daily.  ? torsemide (DEMADEX) 20 MG tablet Take 1 tablet (20 mg total) by mouth daily as needed (for weight gain > 3 lb in 24 hrs or 5 lb over dry weight).  ? traZODone (DESYREL) 50 MG tablet Take 0.5-1 tablets (25-50 mg total) by mouth at bedtime as needed for sleep.  ?  ? ?No Known Allergies ? ?Social History  ? ?Socioeconomic History  ? Marital status: Divorced  ?  Spouse name: Not on file  ? Number of children: 2  ? Years of education: Not on file  ? Highest education level: Some college,  no degree  ?Occupational History  ? Occupation: Librarian, academic  ?  Comment: [place] [employer]  ?Tobacco Use  ? Smoking status: Never  ? Smokeless tobacco: Current  ?  Types: Chew  ?Vaping Use  ? Vaping Use: Never used  ?Substance and Sexual Activity  ? Alcohol use: Yes  ?  Alcohol/week: 17.0 standard drinks  ?  Types: 17 Shots of liquor per week  ?  Comment: drinks fifth of liquor per week x 1 year.  ? Drug use: No  ? Sexual activity: Yes  ?Other Topics Concern  ? Not on file  ?Social History Narrative  ? Regular Exercise -  YES  ?   ?   ? ?Social Determinants of Health  ? ?Financial Resource Strain: High Risk  ? Difficulty of Paying Living Expenses: Hard  ?Food Insecurity: No Food Insecurity  ? Worried About  Charity fundraiser in the Last Year: Never true  ? Ran Out of Food in the Last Year: Never true  ?Transportation Needs: No Transportation Needs  ? Lack of Transportation (Medical): No  ? Lack of Transportation (Non-Medical): No  ?Physical Activity: Not on file  ?Stress: Not on file  ?Social Connections: Not on file  ?Intimate Partner Violence: Not on file  ?  ? ?Review of Systems: ?General: negative for chills, fever, night sweats or weight changes.  ?Cardiovascular: negative for chest pain, dyspnea on exertion, edema, orthopnea, palpitations, paroxysmal nocturnal dyspnea or shortness of breath ?Dermatological: negative for rash ?Respiratory: negative for cough or wheezing ?Urologic: negative for hematuria ?Abdominal: negative for nausea, vomiting, diarrhea, bright red blood per rectum, melena, or hematemesis ?Neurologic: negative for visual changes, syncope, or dizziness ?All other systems reviewed and are otherwise negative except as noted above. ? ? ? ?Blood pressure 130/80, pulse (!) 52, height 5\' 8"  (1.727 m), weight 229 lb (103.9 kg), SpO2 97 %.  ?General appearance: alert and no distress ?Neck: no adenopathy, no carotid bruit, no JVD, supple, symmetrical, trachea midline, and thyroid not enlarged, symmetric, no tenderness/mass/nodules ?Lungs: clear to auscultation bilaterally ?Heart: regular rate and rhythm, S1, S2 normal, no murmur, click, rub or gallop ?Extremities: extremities normal, atraumatic, no cyanosis or edema ?Pulses: 2+ and symmetric ?Skin: Skin color, texture, turgor normal. No rashes or lesions ?Neurologic: Grossly normal ? ?EKG not performed today. ? ?ASSESSMENT AND PLAN:  ? 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  He converted spontaneously.  He was not cardioverted because of left atrial thrombus seen on transesophageal echo. ? ?Acute HFrEF (heart failure with reduced ejection fraction)  ([place]) ?History of nonischemic cardiomyopathy with an EF initially in the 25% range.  He underwent right left heart cath by Dr. Haroldine Laws revealing clean coronary arteries.  He was diuresed and placed on optimal medical therapy.  His EF has improved up to normal by recent 2D echo performed 05/26/2021.  He does feel clinically improved. ? ?Alcohol use ?Markedly reduced ethanol intake.  He now drinks 1 pint of bourbon every other week and a beer a day. ? ? ? ? ?Lorretta Harp MD FACP,FACC,FAHA, FSCAI ?06/09/2021 ?1:49 PM ?

## 2021-06-17 ENCOUNTER — Telehealth: Payer: Self-pay | Admitting: *Deleted

## 2021-06-17 NOTE — Telephone Encounter (Signed)
The patient has been notified of the result and verbalized understanding.  All questions (if any) were answered. Eddie Dixon, CMA 06/17/2021 5:22 PM   Pt is agreeable to normal results and declined further testing.

## 2021-06-17 NOTE — Telephone Encounter (Signed)
-----   Message from Gaynelle Cage, [place] sent at 06/16/2021 12:38 PM EDT -----  ----- Message ----- From: Quintella Reichert, MD Sent: 06/08/2021   6:59 PM EDT To: [facility] Sleep Studies  Normal home sleep study so in lab PSG will be ordered

## 2021-06-22 ENCOUNTER — Telehealth (HOSPITAL_COMMUNITY): Payer: Self-pay | Admitting: *Deleted

## 2021-06-22 NOTE — Telephone Encounter (Signed)
The atorvastatin can cause muscle aches/fatigue. However, he has been taking the atorvastatin since January with no issues documented. If this is a new issue, it may not be medication related.

## 2021-06-22 NOTE — Telephone Encounter (Signed)
Pt called c/o "fatigue in legs" pt says he feels like he ran a marathon. Pt said he was told in the hospital one of his meds could cause that issue. Pt isnt sure which medication is the problem.   Message routed to [facility] for advice

## 2021-07-13 ENCOUNTER — Other Ambulatory Visit (HOSPITAL_COMMUNITY): Payer: Self-pay | Admitting: *Deleted

## 2021-07-29 ENCOUNTER — Other Ambulatory Visit (HOSPITAL_COMMUNITY): Payer: Self-pay | Admitting: *Deleted

## 2021-07-29 MED ORDER — LOSARTAN POTASSIUM 50 MG PO TABS
50.0000 mg | ORAL_TABLET | Freq: Every day | ORAL | 3 refills | Status: DC
Start: 2021-07-29 — End: 2021-11-23

## 2021-07-29 MED ORDER — DAPAGLIFLOZIN PROPANEDIOL 10 MG PO TABS: 10.0000 mg | ORAL_TABLET | Freq: Every day | ORAL | 3 refills | Status: DC

## 2021-07-29 MED ORDER — TORSEMIDE 20 MG PO TABS: 20.0000 mg | ORAL_TABLET | Freq: Every day | ORAL | 3 refills | Status: DC | PRN

## 2021-07-29 MED ORDER — SPIRONOLACTONE 25 MG PO TABS: 12.5000 mg | ORAL_TABLET | Freq: Every day | ORAL | 3 refills | Status: DC

## 2021-07-29 MED ORDER — AMIODARONE HCL 200 MG PO TABS: 200.0000 mg | ORAL_TABLET | Freq: Every day | ORAL | 5 refills | Status: DC

## 2021-07-29 MED ORDER — ATORVASTATIN CALCIUM 40 MG PO TABS: 40.0000 mg | ORAL_TABLET | Freq: Every day | ORAL | 3 refills | Status: DC

## 2021-07-29 MED ORDER — CARVEDILOL 6.25 MG PO TABS: 6.2500 mg | ORAL_TABLET | Freq: Two times a day (BID) | ORAL | 3 refills | Status: DC

## 2021-07-29 MED ORDER — APIXABAN 5 MG PO TABS: 5.0000 mg | ORAL_TABLET | Freq: Two times a day (BID) | ORAL | 3 refills | Status: DC

## 2021-08-10 ENCOUNTER — Telehealth: Payer: Self-pay

## 2021-08-10 NOTE — Telephone Encounter (Signed)
Pt is requesting a refill on discontinued medication: tadalafil (CIALIS) 5 MG tablet   Pharmacy: [facility] [address] [place] - [address]  LOV 03/09/21 ROV 09/06/21

## 2021-08-10 NOTE — Telephone Encounter (Signed)
Med not on med list pls advise on refill../lmb 

## 2021-08-11 MED ORDER — TADALAFIL 5 MG PO TABS: 5.0000 mg | ORAL_TABLET | Freq: Every day | ORAL | 3 refills | Status: DC

## 2021-08-11 NOTE — Telephone Encounter (Signed)
Okay.  Thanks.

## 2021-09-06 ENCOUNTER — Ambulatory Visit (INDEPENDENT_AMBULATORY_CARE_PROVIDER_SITE_OTHER): Payer: Managed Care, Other (non HMO) | Admitting: Internal Medicine

## 2021-09-06 ENCOUNTER — Encounter: Payer: Self-pay | Admitting: Internal Medicine

## 2021-09-06 VITALS — BP 118/70 | HR 56 | Temp 98.1°F | Ht 68.0 in | Wt 226.0 lb

## 2021-09-06 DIAGNOSIS — Z0000 Encounter for general adult medical examination without abnormal findings: Secondary | ICD-10-CM | POA: Diagnosis not present

## 2021-09-06 DIAGNOSIS — I6330 Cerebral infarction due to thrombosis of unspecified cerebral artery: Secondary | ICD-10-CM | POA: Diagnosis not present

## 2021-09-06 DIAGNOSIS — I4891 Unspecified atrial fibrillation: Secondary | ICD-10-CM

## 2021-09-06 DIAGNOSIS — R252 Cramp and spasm: Secondary | ICD-10-CM | POA: Diagnosis not present

## 2021-09-06 DIAGNOSIS — F17200 Nicotine dependence, unspecified, uncomplicated: Secondary | ICD-10-CM

## 2021-09-06 DIAGNOSIS — I5021 Acute systolic (congestive) heart failure: Secondary | ICD-10-CM | POA: Diagnosis not present

## 2021-09-06 DIAGNOSIS — R739 Hyperglycemia, unspecified: Secondary | ICD-10-CM

## 2021-09-06 LAB — URINALYSIS
Bilirubin Urine: NEGATIVE
Hgb urine dipstick: NEGATIVE
Ketones, ur: NEGATIVE
Leukocytes,Ua: NEGATIVE
Nitrite: NEGATIVE
Specific Gravity, Urine: 1.025 (ref 1.000–1.030)
Total Protein, Urine: NEGATIVE
Urine Glucose: >=1000 — AB
Urobilinogen, UA: 0.2 (ref 0.0–1.0)
pH: 5.5 (ref 5.0–8.0)

## 2021-09-06 LAB — CBC WITH DIFFERENTIAL/PLATELET
Basophils Absolute: 0 10*3/uL (ref 0.0–0.1)
Basophils Relative: 0.4 % (ref 0.0–3.0)
Eosinophils Absolute: 0.2 10*3/uL (ref 0.0–0.7)
Eosinophils Relative: 4.7 % (ref 0.0–5.0)
HCT: 46.4 % (ref 39.0–52.0)
Hemoglobin: 15.5 g/dL (ref 13.0–17.0)
Lymphocytes Relative: 19.3 % (ref 12.0–46.0)
Lymphs Abs: 0.8 10*3/uL (ref 0.7–4.0)
MCHC: 33.3 g/dL (ref 30.0–36.0)
MCV: 93.8 fl (ref 78.0–100.0)
Monocytes Absolute: 0.7 10*3/uL (ref 0.1–1.0)
Monocytes Relative: 16.2 % — ABNORMAL HIGH (ref 3.0–12.0)
Neutro Abs: 2.5 10*3/uL (ref 1.4–7.7)
Neutrophils Relative %: 59.4 % (ref 43.0–77.0)
Platelets: 170 10*3/uL (ref 150.0–400.0)
RBC: 4.94 Mil/uL (ref 4.22–5.81)
RDW: 12.4 % (ref 11.5–15.5)
WBC: 4.2 10*3/uL (ref 4.0–10.5)

## 2021-09-06 LAB — COMPREHENSIVE METABOLIC PANEL WITH GFR
ALT: 26 U/L (ref 0–53)
AST: 23 U/L (ref 0–37)
Albumin: 4.3 g/dL (ref 3.5–5.2)
Alkaline Phosphatase: 38 U/L — ABNORMAL LOW (ref 39–117)
BUN: 21 mg/dL (ref 6–23)
CO2: 28 meq/L (ref 19–32)
Calcium: 9 mg/dL (ref 8.4–10.5)
Chloride: 103 meq/L (ref 96–112)
Creatinine, Ser: 1.29 mg/dL (ref 0.40–1.50)
GFR: 63.24 mL/min
Glucose, Bld: 110 mg/dL — ABNORMAL HIGH (ref 70–99)
Potassium: 4.2 meq/L (ref 3.5–5.1)
Sodium: 138 meq/L (ref 135–145)
Total Bilirubin: 0.8 mg/dL (ref 0.2–1.2)
Total Protein: 6.9 g/dL (ref 6.0–8.3)

## 2021-09-06 LAB — TSH: TSH: 1.14 u[IU]/mL (ref 0.35–5.50)

## 2021-09-06 LAB — LIPID PANEL
Cholesterol: 152 mg/dL (ref 0–200)
HDL: 72 mg/dL (ref >39.00)
LDL Cholesterol: 64 mg/dL (ref 0–99)
NonHDL: 79.86
Total CHOL/HDL Ratio: 2
Triglycerides: 79 mg/dL (ref 0.0–149.0)
VLDL: 15.8 mg/dL (ref 0.0–40.0)

## 2021-09-06 LAB — HEMOGLOBIN A1C: Hgb A1c MFr Bld: 6 % (ref 4.6–6.5)

## 2021-09-06 NOTE — Assessment & Plan Note (Signed)
On Eliquis BP control Lipitor, Marcelline Deist

## 2021-09-06 NOTE — Assessment & Plan Note (Signed)
On Eliquis BP control Lipitor, Farxiga 

## 2021-09-06 NOTE — Assessment & Plan Note (Signed)
Not smoking 2023

## 2021-09-06 NOTE — Progress Notes (Signed)
Subjective:  Patient ID: Eddie Dixon, male    DOB: Sep 25, 1965  Age: 56 y.o. MRN: [id number]  CC: Annual Exam   HPI Enrique Manganaro presents for a well exam C/o myalgias   Outpatient Medications Prior to Visit  Medication Sig Dispense Refill   amiodarone (PACERONE) 200 MG tablet Take 1 tablet (200 mg total) by mouth daily. 30 tablet 5   apixaban (ELIQUIS) 5 MG TABS tablet Take 1 tablet (5 mg total) by mouth 2 (two) times daily. 60 tablet 3   atorvastatin (LIPITOR) 40 MG tablet Take 1 tablet (40 mg total) by mouth daily. 30 tablet 3   carvedilol (COREG) 6.25 MG tablet Take 1 tablet (6.25 mg total) by mouth 2 (two) times daily with a meal. 60 tablet 3   Cholecalciferol (VITAMIN D3) 50 MCG (2000 UT) capsule Take 1 capsule (2,000 Units total) by mouth daily. 100 capsule 3   dapagliflozin propanediol (FARXIGA) 10 MG TABS tablet Take 1 tablet (10 mg total) by mouth daily. 30 tablet 3   losartan (COZAAR) 50 MG tablet Take 1 tablet (50 mg total) by mouth daily. 90 tablet 3   Multiple Vitamins-Minerals (MULTIVITAMIN ADULTS) TABS Take 1 tablet by mouth daily.     spironolactone (ALDACTONE) 25 MG tablet Take 1/2 tablet (12.5 mg total) by mouth daily. 15 tablet 3   tadalafil (CIALIS) 5 MG tablet Take 1 tablet (5 mg total) by mouth daily. 90 tablet 3   torsemide (DEMADEX) 20 MG tablet Take 1 tablet (20 mg total) by mouth daily as needed (for weight gain > 3 lb in 24 hrs or 5 lb over dry weight). 30 tablet 3   traZODone (DESYREL) 50 MG tablet Take 0.5-1 tablets (25-50 mg total) by mouth at bedtime as needed for sleep. 30 tablet 5   No facility-administered medications prior to visit.    ROS: Review of Systems  Constitutional:  Negative for appetite change, fatigue and unexpected weight change.  HENT:  Negative for congestion, nosebleeds, sneezing, sore throat and trouble swallowing.   Eyes:  Negative for itching and visual disturbance.  Respiratory:  Negative for cough.   Cardiovascular:   Negative for chest pain, palpitations and leg swelling.  Gastrointestinal:  Negative for abdominal distention, blood in stool, diarrhea and nausea.  Genitourinary:  Negative for frequency and hematuria.  Musculoskeletal:  Positive for arthralgias and myalgias. Negative for back pain, gait problem, joint swelling and neck pain.  Skin:  Negative for rash.  Neurological:  Negative for dizziness, tremors, speech difficulty and weakness.  Psychiatric/Behavioral:  Negative for agitation, dysphoric mood, sleep disturbance and suicidal ideas. The patient is not nervous/anxious.     Objective:  BP 118/70 (BP Location: Left Arm, Patient Position: Sitting, Cuff Size: Large)   Pulse (!) 56   Temp 98.1 F (36.7 C) (Oral)   Ht 5\' 8"  (1.727 m)   Wt 226 lb (102.5 kg)   SpO2 98%   BMI 34.36 kg/m   BP Readings from Last 3 Encounters:  09/06/21 118/70  06/09/21 130/80  05/26/21 (!) 160/90    Wt Readings from Last 3 Encounters:  09/06/21 226 lb (102.5 kg)  06/09/21 229 lb (103.9 kg)  05/26/21 234 lb (106.1 kg)    Physical Exam Constitutional:      General: He is not in acute distress.    Appearance: He is well-developed.     Comments: NAD  Eyes:     Conjunctiva/sclera: Conjunctivae normal.     Pupils: Pupils are equal, round,  and reactive to light.  Neck:     Thyroid: No thyromegaly.     Vascular: No JVD.  Cardiovascular:     Rate and Rhythm: Normal rate and regular rhythm.     Heart sounds: Normal heart sounds. No murmur heard.    No friction rub. No gallop.  Pulmonary:     Effort: Pulmonary effort is normal. No respiratory distress.     Breath sounds: Normal breath sounds. No wheezing or rales.  Chest:     Chest wall: No tenderness.  Abdominal:     General: Bowel sounds are normal. There is no distension.     Palpations: Abdomen is soft. There is no mass.     Tenderness: There is no abdominal tenderness. There is no guarding or rebound.  Musculoskeletal:        General: No  tenderness. Normal range of motion.     Cervical back: Normal range of motion.  Lymphadenopathy:     Cervical: No cervical adenopathy.  Skin:    General: Skin is warm and dry.     Findings: No rash.  Neurological:     Mental Status: He is alert and oriented to person, place, and time.     Cranial Nerves: No cranial nerve deficit.     Motor: No abnormal muscle tone.     Coordination: Coordination normal.     Gait: Gait normal.     Deep Tendon Reflexes: Reflexes are normal and symmetric.  Psychiatric:        Behavior: Behavior normal.        Thought Content: Thought content normal.        Judgment: Judgment normal.    Leg muscles w/pain   Lab Results  Component Value Date   WBC 5.1 03/01/2021   HGB 16.9 03/01/2021   HCT 50.1 03/01/2021   PLT 215 03/01/2021   GLUCOSE 99 06/09/2021   CHOL 161 05/24/2021   TRIG 101 05/24/2021   HDL 84 05/24/2021   LDLDIRECT 146.3 01/24/2008   LDLCALC 59 05/24/2021   ALT 68 (H) 02/13/2021   AST 47 (H) 02/13/2021   NA 142 06/09/2021   K 4.6 06/09/2021   CL 107 06/09/2021   CREATININE 1.25 (H) 06/09/2021   BUN 20 06/09/2021   CO2 29 06/09/2021   TSH 2.80 01/19/2021   PSA 0.57 01/19/2021   INR 1.1 02/13/2021   HGBA1C 5.6 02/21/2021    No results found.  Assessment & Plan:   Problem List Items Addressed This Visit     Acute HFrEF (heart failure with reduced ejection fraction) (HCC)    On Eliquis BP control Lipitor, Farxiga      Atrial fibrillation with RVR (HCC)    On Eliquis      Cerebral thrombosis with cerebral infarction    On Eliquis BP control Lipitor, Farxiga      Cramps of lower extremity    Hold Atorvastatin x1-2 weeks  Try Magnesium oil spray for cramps      Relevant Orders   Hemoglobin A1c   TOBACCO USE DISORDER/SMOKER-SMOKING CESSATION DISCUSSED    Not smoking 2023      Well adult exam - Primary    We discussed age appropriate health related issues, including available/recomended screening tests  and vaccinations. Labs were ordered to be later reviewed . All questions were answered. We discussed one or more of the following - seat belt use, use of sunscreen/sun exposure exercise, fall risk reduction, second hand smoke exposure, firearm use and  storage, seat belt use, a need for adhering to healthy diet and exercise. Labs were ordered.  All questions were answered.      Relevant Orders   TSH   Urinalysis   CBC with Differential/Platelet   Lipid panel   Comprehensive metabolic panel   Hemoglobin A1c   Other Visit Diagnoses     Hyperglycemia       Relevant Orders   Hemoglobin A1c         No orders of the defined types were placed in this encounter.     Follow-up: Return in about 6 months (around 03/09/2022) for a follow-up visit.  Walker Kehr, MD

## 2021-09-06 NOTE — Assessment & Plan Note (Signed)
On Eliquis

## 2021-09-06 NOTE — Assessment & Plan Note (Signed)

## 2021-09-06 NOTE — Assessment & Plan Note (Addendum)
Hold Atorvastatin x1-2 weeks  Try Magnesium oil spray for cramps

## 2021-09-06 NOTE — Patient Instructions (Signed)
Hold Atorvastatin x1-2 weeks  Try Magnesium oil spray for cramps 

## 2021-09-30 ENCOUNTER — Encounter (HOSPITAL_COMMUNITY): Payer: Managed Care, Other (non HMO) | Admitting: Internal Medicine

## 2021-10-31 ENCOUNTER — Encounter (HOSPITAL_COMMUNITY): Payer: Self-pay

## 2021-10-31 ENCOUNTER — Ambulatory Visit (HOSPITAL_COMMUNITY)
Admission: EM | Admit: 2021-10-31 | Discharge: 2021-10-31 | Disposition: A | Discharge status: Home / Self Care | Payer: Managed Care, Other (non HMO)

## 2021-10-31 DIAGNOSIS — H6121 Impacted cerumen, right ear: Secondary | ICD-10-CM | POA: Diagnosis not present

## 2021-10-31 NOTE — ED Provider Notes (Signed)
[facility]    CSN: [id number] Arrival date & time: 10/31/21  1617      History   Chief Complaint Chief Complaint  Patient presents with   Otalgia    HPI Eddie Dixon is a 56 y.o. male.   Pt complains of muffled hearing and fullness to the left ear that started about four days ago.  Denies pain.  He reports trying to flush the ear out himself at home with no improvement. Denies congestion, rhinorrhea, sinus pressure, fever, chills.     Past Medical History:  Diagnosis Date   A-fib [facility])    Anxiety    ED (erectile dysfunction)    Elevated blood pressure     Patient Active Problem List   Diagnosis Date Noted   Cramps of lower extremity 09/06/2021   History of cardioembolic cerebrovascular accident (CVA) 03/09/2021   Cerebral thrombosis with cerebral infarction 02/21/2021   New onset of congestive heart failure (HCC) 02/13/2021   Acute HFrEF (heart failure with reduced ejection fraction) (HCC) 02/13/2021   AKI (acute kidney injury) (HCC) 02/13/2021   Alcohol use 02/13/2021   Paresthesia 01/27/2021   Family history of early CAD 01/27/2021   Atrial fibrillation with RVR (HCC) 01/27/2021   Urinary tract infection 01/27/2021   Insomnia 11/16/2020   Exposure to STD 06/13/2017   Well adult exam 06/20/2012   Cerumen impaction 06/20/2012   ABNORMAL GLUCOSE NEC 02/01/2008   ANXIETY 04/05/2007   TOBACCO USE DISORDER/SMOKER-SMOKING CESSATION DISCUSSED 04/05/2007   ERECTILE DYSFUNCTION 12/01/2006    Past Surgical History:  Procedure Laterality Date   BUBBLE STUDY  02/16/2021   Procedure: BUBBLE STUDY;  Surgeon: Thomasene Ripple, DO;  Location: [facility];  Service: Cardiovascular;;   RIGHT/LEFT HEART CATH AND CORONARY ANGIOGRAPHY N/A 02/19/2021   Procedure: RIGHT/LEFT HEART CATH AND CORONARY ANGIOGRAPHY;  Surgeon: Dolores Patty, MD;  Location: [facility];  Service: Cardiovascular;  Laterality: N/A;   TEE WITHOUT CARDIOVERSION N/A 02/16/2021    Procedure: TRANSESOPHAGEAL ECHOCARDIOGRAM (TEE);  Surgeon: Thomasene Ripple, DO;  Location: [facility];  Service: Cardiovascular;  Laterality: N/A;       Home Medications    Prior to Admission medications   Medication Sig Start Date End Date Taking? Authorizing Provider  amiodarone (PACERONE) 200 MG tablet Take 1 tablet (200 mg total) by mouth daily for 15 days. Then STOP 11/23/21 12/08/21  Milford, Anderson Malta, FNP  apixaban (ELIQUIS) 5 MG TABS tablet Take 1 tablet (5 mg total) by mouth 2 (two) times daily. 11/23/21   Milford, Anderson Malta, FNP  atorvastatin (LIPITOR) 40 MG tablet Take 1 tablet (40 mg total) by mouth every other day. 11/23/21   Milford, Anderson Malta, FNP  carvedilol (COREG) 6.25 MG tablet Take 1 tablet (6.25 mg total) by mouth 2 (two) times daily with a meal. 11/23/21   Milford, Anderson Malta, FNP  Cholecalciferol (VITAMIN D3) 50 MCG (2000 UT) capsule Take 1 capsule (2,000 Units total) by mouth daily. 01/27/21   Plotnikov, Georgina Quint, MD  co-enzyme Q-10 30 MG capsule Take 3 capsules (90 mg total) by mouth 2 (two) times daily. 11/24/21   Micki Riley, MD  dapagliflozin propanediol (FARXIGA) 10 MG TABS tablet Take 1 tablet (10 mg total) by mouth daily. 11/23/21   Milford, Anderson Malta, FNP  losartan (COZAAR) 50 MG tablet Take 1 tablet (50 mg total) by mouth daily. 11/23/21   Milford, Anderson Malta, FNP  Multiple Vitamins-Minerals (MULTIVITAMIN ADULTS) TABS Take 1 tablet by mouth daily.  [provider]  spironolactone (ALDACTONE) 25 MG tablet Take 1/2 tablet (12.5 mg total) by mouth daily. 11/23/21   Milford, Maricela Bo, FNP  tadalafil (CIALIS) 5 MG tablet Take 1 tablet (5 mg total) by mouth daily. Patient taking differently: Take 5 mg by mouth daily as needed. 08/11/21   Plotnikov, Evie Lacks, MD  torsemide (DEMADEX) 20 MG tablet Take 1 tablet (20 mg total) by mouth daily as needed (for weight gain > 3 lb in 24 hrs or 5 lb over dry weight). 11/23/21   Milford, Maricela Bo, FNP  traZODone  (DESYREL) 50 MG tablet Take 0.5-1 tablets (25-50 mg total) by mouth at bedtime as needed for sleep. 11/16/20   Plotnikov, Evie Lacks, MD    Family History Family History  Problem Relation Age of Onset   Heart disease Father 60       MI    Social History Social History   Tobacco Use   Smoking status: Never   Smokeless tobacco: Current    Types: Chew  Vaping Use   Vaping Use: Never used  Substance Use Topics   Alcohol use: Yes    Alcohol/week: 17.0 standard drinks of alcohol    Types: 17 Shots of liquor per week    Comment: drinks fifth of liquor per week x 1 year.   Drug use: No     Allergies   Patient has no known allergies.   Review of Systems Review of Systems  Constitutional:  Negative for chills and fever.  HENT:  Positive for ear pain. Negative for sore throat.   Eyes:  Negative for pain and visual disturbance.  Respiratory:  Negative for cough and shortness of breath.   Cardiovascular:  Negative for chest pain and palpitations.  Gastrointestinal:  Negative for abdominal pain and vomiting.  Genitourinary:  Negative for dysuria and hematuria.  Musculoskeletal:  Negative for arthralgias and back pain.  Skin:  Negative for color change and rash.  Neurological:  Negative for seizures and syncope.  All other systems reviewed and are negative.    Physical Exam Triage Vital Signs ED Triage Vitals [10/31/21 1700]  Enc Vitals Group     BP (!) 143/80     Pulse Rate (!) 55     Resp 12     Temp 98.3 F (36.8 C)     Temp Source Oral     SpO2 98 %     Weight      Height      Head Circumference      Peak Flow      Pain Score 0     Pain Loc      Pain Edu?      Excl. in [place]?    No data found.  Updated Vital Signs BP (!) 143/80 (BP Location: Left Arm)   Pulse (!) 55   Temp 98.3 F (36.8 C) (Oral)   Resp 12   SpO2 98%   Visual Acuity Right Eye Distance:   Left Eye Distance:   Bilateral Distance:    Right Eye Near:   Left Eye Near:    Bilateral  Near:     Physical Exam Vitals and nursing note reviewed.  Constitutional:      General: He is not in acute distress.    Appearance: He is well-developed.  HENT:     Head: Normocephalic and atraumatic.     Right Ear: There is impacted cerumen.     Left Ear: There is impacted cerumen.  Eyes:     Conjunctiva/sclera: Conjunctivae normal.  Cardiovascular:     Rate and Rhythm: Normal rate and regular rhythm.     Heart sounds: No murmur heard. Pulmonary:     Effort: Pulmonary effort is normal. No respiratory distress.     Breath sounds: Normal breath sounds.  Abdominal:     Palpations: Abdomen is soft.     Tenderness: There is no abdominal tenderness.  Musculoskeletal:        General: No swelling.     Cervical back: Neck supple.  Skin:    General: Skin is warm and dry.     Capillary Refill: Capillary refill takes less than 2 seconds.  Neurological:     Mental Status: He is alert.  Psychiatric:        Mood and Affect: Mood normal.      UC Treatments / Results  Labs (all labs ordered are listed, but only abnormal results are displayed) Labs Reviewed - No data to display  EKG   Radiology No results found.  Procedures Procedures (including critical care time)  Medications Ordered in UC Medications - No data to display  Initial Impression / Assessment and Plan / UC Course  I have reviewed the triage vital signs and the nursing notes.  Pertinent labs & imaging results that were available during my care of the patient were reviewed by me and considered in my medical decision making (see chart for details).     Cerumen impaction bilaterally, flushed in clinic today.  Pt reports some improvement.  Advised follow up with PCP if sx persists.  Final Clinical Impressions(s) / UC Diagnoses   Final diagnoses:  Impacted cerumen of right ear     Discharge Instructions      Can use over the counter Debrox for wax buildup.  Avoid qtips.  Follow up with Primary Care  Physician if symptoms persists.       ED Prescriptions   None    PDMP not reviewed this encounter.   Ward, Tylene Fantasia, PA-C 11/29/21 1700

## 2021-10-31 NOTE — ED Notes (Signed)
Pt discharged by [facility] but not discharged out of the computer.  This nurse is just taking patient out of the computer.

## 2021-10-31 NOTE — ED Triage Notes (Signed)
Pt is here for right ear clogged x 4days  

## 2021-10-31 NOTE — Discharge Instructions (Signed)
Can use over the counter Debrox for wax buildup.  Avoid qtips.  Follow up with Primary Care Physician if symptoms persists.

## 2021-11-17 ENCOUNTER — Encounter: Payer: Self-pay | Admitting: Cardiovascular Disease

## 2021-11-22 ENCOUNTER — Telehealth (HOSPITAL_COMMUNITY): Payer: Self-pay | Admitting: Pharmacy Technician

## 2021-11-22 NOTE — Telephone Encounter (Signed)
Advanced Heart Failure Patient Advocate Encounter  Patient called and left vm that his discount was not applied to his medication. Did not specify which medication. Called patient back to get clarity, no way to leave [place], CPhT

## 2021-11-23 ENCOUNTER — Ambulatory Visit (HOSPITAL_COMMUNITY)
Admission: RE | Admit: 2021-11-23 | Discharge: 2021-11-23 | Discharge status: Home / Self Care | Payer: Managed Care, Other (non HMO) | Source: Ambulatory Visit | Attending: Family Medicine

## 2021-11-23 ENCOUNTER — Telehealth (HOSPITAL_COMMUNITY): Payer: Self-pay

## 2021-11-23 ENCOUNTER — Encounter (HOSPITAL_COMMUNITY): Payer: Self-pay

## 2021-11-23 VITALS — BP 148/88 | HR 57 | Wt 245.0 lb

## 2021-11-23 DIAGNOSIS — Z79899 Other long term (current) drug therapy: Secondary | ICD-10-CM | POA: Insufficient documentation

## 2021-11-23 DIAGNOSIS — I5022 Chronic systolic (congestive) heart failure: Secondary | ICD-10-CM

## 2021-11-23 DIAGNOSIS — F1010 Alcohol abuse, uncomplicated: Secondary | ICD-10-CM | POA: Insufficient documentation

## 2021-11-23 DIAGNOSIS — Z7901 Long term (current) use of anticoagulants: Secondary | ICD-10-CM | POA: Diagnosis not present

## 2021-11-23 DIAGNOSIS — Z789 Other specified health status: Secondary | ICD-10-CM | POA: Diagnosis not present

## 2021-11-23 DIAGNOSIS — Z8673 Personal history of transient ischemic attack (TIA), and cerebral infarction without residual deficits: Secondary | ICD-10-CM | POA: Diagnosis not present

## 2021-11-23 DIAGNOSIS — I513 Intracardiac thrombosis, not elsewhere classified: Secondary | ICD-10-CM

## 2021-11-23 DIAGNOSIS — R7303 Prediabetes: Secondary | ICD-10-CM | POA: Diagnosis not present

## 2021-11-23 DIAGNOSIS — Z7984 Long term (current) use of oral hypoglycemic drugs: Secondary | ICD-10-CM | POA: Insufficient documentation

## 2021-11-23 DIAGNOSIS — R0609 Other forms of dyspnea: Secondary | ICD-10-CM | POA: Insufficient documentation

## 2021-11-23 DIAGNOSIS — R5383 Other fatigue: Secondary | ICD-10-CM | POA: Insufficient documentation

## 2021-11-23 DIAGNOSIS — I480 Paroxysmal atrial fibrillation: Secondary | ICD-10-CM | POA: Diagnosis not present

## 2021-11-23 DIAGNOSIS — I2510 Atherosclerotic heart disease of native coronary artery without angina pectoris: Secondary | ICD-10-CM | POA: Insufficient documentation

## 2021-11-23 DIAGNOSIS — I63419 Cerebral infarction due to embolism of unspecified middle cerebral artery: Secondary | ICD-10-CM

## 2021-11-23 LAB — CBC
HCT: 43.3 % (ref 39.0–52.0)
Hemoglobin: 14.8 g/dL (ref 13.0–17.0)
MCH: 32.8 pg (ref 26.0–34.0)
MCHC: 34.2 g/dL (ref 30.0–36.0)
MCV: 96 fL (ref 80.0–100.0)
Platelets: 163 10*3/uL (ref 150–400)
RBC: 4.51 MIL/uL (ref 4.22–5.81)
RDW: 12.1 % (ref 11.5–15.5)
WBC: 5.4 10*3/uL (ref 4.0–10.5)
nRBC: 0 % (ref 0.0–0.2)

## 2021-11-23 LAB — BASIC METABOLIC PANEL
Anion gap: 10 (ref 5–15)
BUN: 15 mg/dL (ref 6–20)
CO2: 27 mmol/L (ref 22–32)
Calcium: 9.6 mg/dL (ref 8.9–10.3)
Chloride: 104 mmol/L (ref 98–111)
Creatinine, Ser: 1.21 mg/dL (ref 0.61–1.24)
GFR, Estimated: >60 mL/min (ref >60)
Glucose, Bld: 111 mg/dL — ABNORMAL HIGH (ref 70–99)
Potassium: 4.5 mmol/L (ref 3.5–5.1)
Sodium: 141 mmol/L (ref 135–145)

## 2021-11-23 LAB — BRAIN NATRIURETIC PEPTIDE: B Natriuretic Peptide: 15.2 pg/mL (ref 0.0–100.0)

## 2021-11-23 MED ORDER — AMIODARONE HCL 200 MG PO TABS: 200.0000 mg | ORAL_TABLET | Freq: Every day | ORAL | 0 refills | Status: DC

## 2021-11-23 MED ORDER — CARVEDILOL 6.25 MG PO TABS: 6.2500 mg | ORAL_TABLET | Freq: Two times a day (BID) | ORAL | 3 refills | Status: DC

## 2021-11-23 MED ORDER — APIXABAN 5 MG PO TABS: 5.0000 mg | ORAL_TABLET | Freq: Two times a day (BID) | ORAL | 3 refills | Status: DC

## 2021-11-23 MED ORDER — TORSEMIDE 20 MG PO TABS: 20.0000 mg | ORAL_TABLET | Freq: Every day | ORAL | 3 refills | Status: DC | PRN

## 2021-11-23 MED ORDER — SPIRONOLACTONE 25 MG PO TABS: 12.5000 mg | ORAL_TABLET | Freq: Every day | ORAL | 3 refills | Status: DC

## 2021-11-23 MED ORDER — DAPAGLIFLOZIN PROPANEDIOL 10 MG PO TABS: 10.0000 mg | ORAL_TABLET | Freq: Every day | ORAL | 3 refills | Status: DC

## 2021-11-23 MED ORDER — LOSARTAN POTASSIUM 50 MG PO TABS: 50.0000 mg | ORAL_TABLET | Freq: Every day | ORAL | 3 refills | Status: DC

## 2021-11-23 MED ORDER — ATORVASTATIN CALCIUM 40 MG PO TABS: 40.0000 mg | ORAL_TABLET | ORAL | 3 refills | Status: DC

## 2021-11-23 NOTE — Patient Instructions (Signed)
DECREASE Amiodarone to 200 mg daily for 2 weeks then STOP -be sure to call our office if flutter or increased HR returns  CHANGE Lipitor to 40 mg one tab every other day  Labs today We will only contact you if something comes back abnormal or we need to make some changes. Otherwise no news is good news!  Your physician wants you to follow-up in: 6 months with Dr Haroldine Laws. You will receive a reminder letter in the mail two months in advance. If you don't receive a letter, please call our office to schedule the follow-up appointment.    Do the following things EVERYDAY: Weigh yourself in the morning before breakfast. Write it down and keep it in a log. Take your medicines as prescribed Eat low salt foods--Limit salt (sodium) to 2000 mg per day.  Stay as active as you can everyday Limit all fluids for the day to less than 2 liters   At the [facility], you and your health needs are our priority. As part of our continuing mission to provide you with exceptional heart care, we have created designated Provider Care Teams. These Care Teams include your primary Cardiologist (physician) and Advanced Practice Providers (APPs- Physician Assistants and Nurse Practitioners) who all work together to provide you with the care you need, when you need it.   You may see any of the following providers on your designated Care Team at your next follow up: Dr Glori Bickers Dr Loralie Champagne Dr. Roxana Hires, NP Lyda Jester, [place] [facility] Ward, [place] Forestine Na, NP Audry Riles, PharmD   Please be sure to bring in all your medications bottles to every appointment.

## 2021-11-23 NOTE — Addendum Note (Signed)
Encounter addended by: Rafael Bihari, FNP on: 11/23/2021 10:24 AM  Actions taken: Clinical Note Signed

## 2021-11-23 NOTE — Progress Notes (Addendum)
ADVANCED HF CLINIC NOTE  Primary Care: Plotnikov, Georgina Quint, MD HF Cardiologist: Dr. Gala Romney  HPI: Eddie Dixon is a 56 y.o.male with newly diagnosed CHF and Afib. Has hx of ETOH abuse. He established care with Dr. Allyson Sabal on 01/28/21 for Afib with RVR diagnosed at PCP's office the day prior. Noted increasing dyspnea with exertion for several months. He was started on Xarelto and beta blocker.    Admitted 1/23 with a/c CHF and with AF with RVR. Beside echo with LVEF 20-25%. Started on IV lasix and carvedilol. Initially added amiodarone gtt but later stopped d/t concern for chemical conversion with nonadherence with anticoagulation. Formal echo showed EF 20-25%, RV moderately reduced, biatrial enlargement, mild to moderate MR, moderate TR. TEE showed LAA thrombus so unable to proceed with DCCV. BP were soft with sub-optimal UOP, and AHF consulted d/t concern for low-output CHF and to assess for advanced therapies. PICC placed, Co-Ox monitored. Underwent R/LHC showing minimal CAD, well compensated filling pressures and normal output. 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 cMRI showed EF 21% RV 25%. LAA thrombus. No scar. Had RUE weakness, [facility] called. CTA head and neck normal, MRI with small acute infarcts and he was placed on Eliquis. GDMT titrated and he was discharged home, weight 222 lbs.   Hospital follow up, losartan started and DC-CV arranged, but subsequently cancelled after spontaneously converting to NSR.   Echo 5/23 showed EF 50-55%. Dig stopped with improved EF. Losartan increased to 50 daily.  Sleep study 5/23 negative for OSA.  Today he returns for HF follow up. Overall feeling fine, still with daytime fatigue. Noticed weight going up and starting taking PRN torsemide yesterday. 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  Denies palpitations, CP, dizziness, edema, or PND/Orthopnea. Appetite ok. No fever or chills. Weight at home 245 pounds. PCP stopped statin  due to myalgias, with instructions to restart in 1-2 weeks but he never restarted. Myalgias resolved. Drinks 2 beers/night, no further bourbon; chew tobacco.   Cardiac Studies: - Echo (5/23) EF 50-55%, mild LVH, grade II DD, normal RV.  - Echo (1/23): EF 20-25%, RV moderately reduced, biatrial enlargement, mild to moderate MR, moderate TR  - TEE (1/23): EF 30-35%, RV mildly reduced, LAA thrombus, possible   - cMRI (1/23): LVEF 21% RV 25%. LAA thrombus. No scar. NICM Tachy mediated + ETOH.   - R/LHC (1/23):    Prox RCA lesion is 20% stenosed.   The left ventricular ejection fraction is less than 25% by visual estimate.   Ao = 96/73 (83) LV = 97/11 RA = 6 RV = 31/7 PA = 30/5 (22) PCW = 17 Fick cardiac output/index = 6.4/3.0 PVR = 0.80 WU SVR = 960 Ao sat = 95%  PA sat = 73%, 77%  SVC sat = 73%  1. Minimal nonobstructive CAD 2. Severe NICM EF ~20% 3. 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    Past Medical History:  Diagnosis Date   A-fib [facility])    Anxiety    ED (erectile dysfunction)    Elevated blood pressure    Current Outpatient Medications  Medication Sig Dispense Refill   amiodarone (PACERONE) 200 MG tablet Take 1 tablet (200 mg total) by mouth daily. 30 tablet 5   apixaban (ELIQUIS) 5 MG TABS tablet Take 1 tablet (5 mg total) by mouth 2 (two) times daily. 60 tablet 3   atorvastatin (LIPITOR) 40 MG tablet Take 1 tablet (40 mg total)  by mouth daily. 30 tablet 3   carvedilol (COREG) 6.25 MG tablet Take 1 tablet (6.25 mg total) by mouth 2 (two) times daily with a meal. 60 tablet 3   Cholecalciferol (VITAMIN D3) 50 MCG (2000 UT) capsule Take 1 capsule (2,000 Units total) by mouth daily. 100 capsule 3   dapagliflozin propanediol (FARXIGA) 10 MG TABS tablet Take 1 tablet (10 mg total) by mouth daily. 30 tablet 3   losartan (COZAAR) 50 MG tablet Take 1 tablet (50 mg total) by mouth daily. 90 tablet 3   Multiple Vitamins-Minerals  (MULTIVITAMIN ADULTS) TABS Take 1 tablet by mouth daily.     spironolactone (ALDACTONE) 25 MG tablet Take 1/2 tablet (12.5 mg total) by mouth daily. 15 tablet 3   tadalafil (CIALIS) 5 MG tablet Take 1 tablet (5 mg total) by mouth daily. 90 tablet 3   torsemide (DEMADEX) 20 MG tablet Take 1 tablet (20 mg total) by mouth daily as needed (for weight gain > 3 lb in 24 hrs or 5 lb over dry weight). 30 tablet 3   traZODone (DESYREL) 50 MG tablet Take 0.5-1 tablets (25-50 mg total) by mouth at bedtime as needed for sleep. 30 tablet 5   No current facility-administered medications for this encounter.   No Known Allergies  Social History   Socioeconomic History   Marital status: Divorced    Spouse name: Not on file   Number of children: 2   Years of education: Not on file   Highest education level: Some college, no degree  Occupational History   Occupation: Merchandiser, retail    Comment: [employer]  Tobacco Use   Smoking status: Never   Smokeless tobacco: Current    Types: Chew  Vaping Use   Vaping Use: Never used  Substance and Sexual Activity   Alcohol use: Yes    Alcohol/week: 17.0 standard drinks of alcohol    Types: 17 Shots of liquor per week    Comment: drinks fifth of liquor per week x 1 year.   Drug use: No   Sexual activity: Yes  Other Topics Concern   Not on file  Social History Narrative   Regular Exercise -  YES         Social Determinants of Health   Financial Resource Strain: High Risk (02/15/2021)   Overall Financial Resource Strain (CARDIA)    Difficulty of Paying Living Expenses: Hard  Food Insecurity: No Food Insecurity (02/15/2021)   Hunger Vital Sign    Worried About Running Out of Food in the Last Year: Never true    Ran Out of Food in the Last Year: Never true  Transportation Needs: No Transportation Needs (02/15/2021)   PRAPARE - Administrator, Civil Service (Medical): No    Lack of Transportation (Non-Medical): No  Physical Activity: Not on file   Stress: Not on file  Social Connections: Not on file  Intimate Partner Violence: Not on file   Family History  Problem Relation Age of Onset   Heart disease Father 3       MI   BP (!) 148/88   Pulse (!) 57   Wt 111.1 kg   SpO2 98%   BMI 37.25 kg/m   Wt Readings from Last 3 Encounters:  11/23/21 111.1 kg  09/06/21 102.5 kg  06/09/21 103.9 kg   PHYSICAL EXAM: General:  NAD. No resp difficulty HEENT: Normal Neck: Supple. No JVD. Carotids 2+ bilat; no bruits. No lymphadenopathy or thryomegaly appreciated. Cor: PMI  nondisplaced. Regular rate & rhythm. No rubs, gallops or murmurs. Lungs: Clear Abdomen: Soft, nontender, nondistended. No hepatosplenomegaly. No bruits or masses. Good bowel sounds. Extremities: No cyanosis, clubbing, rash, edema Neuro: Alert & oriented x 3, cranial nerves grossly intact. Moves all 4 extremities w/o difficulty. Affect pleasant.  ECG (personally reviewed): SB 56 bpm  ASSESSMENT & PLAN: Chronic systolic CHF: - Suspect tachymediated +- ETOH.  - Echo (1/23): EF 20-25%, RV moderately reduced, biatrial enlargement, mild to moderate MR, moderate TR - TEE (1/23): EF 30-35%, RV mildly reduced, LAA thrombus, possible  - cMRI (1/23): LVEF 21% RV 25%. LAA thrombus. No scar. NICM Tachy mediated + ETOH.  - R/LHC (1/23): minimal CAD, well compensated filling pressures and normal output.  - Echo (5/23), EF improved 50-55% - NYHA I-II (mainly daytime fatigue), volume looks good today though weight up. - OK to continue torsemide for the rest of the week (20 mg daily), then change to PRN. - Continue losartan to 50 mg daily. - Continue Coreg 6.25 mg bid.  - Continue spiro 12.5 mg daily - Continue Farxiga 10 mg daily.  - BMET & BNP today.   2. Atrial fibrillation  - Newly diagnosed 2/23. In setting of ETOH use. No longer drinking bourbon. - LAA thrombus noted on TEE, DCCV deferred. Spontaneously converted to NSR on amio. - Sleep study 5/23 negative for OSA,  AHI 3.9/hr - Remains in SR today on ECG. - Continue Eliquis 5 mg bid. No bleeding issues. - Decrease amiodarone to 100 mg daily x 14 days, then stop. Call if palpitations/increase heart rate recurs.  Will then refer back to EP for possible ablation. - Continue to cut back on ETOH.  - LFTs and TSH OK 8/23.   3. ETOH abuse - Discussed importance of cessation.  - He has cut down more, now 2 beers/day. No bourbon.  4. LAA Clot  - Continue Eliquis 5 mg bid. No bleeding issues. - As above discussed.   5. Stroke - MRI (1/23) new left MCA embolic - No deficits. On Eliquis. - Follows with Dr. Leonie Man. - Restart atorvastatin 40 mg qod. Call if myalgias return.  6. Prediabetes - Last A1c 6.0 - Continue SGLT2i. - Restart statin as above.   Follow up in 6 months with Dr. Haroldine Laws. Refill all HF meds today.   Allena Katz, FNP-BC 11/23/21

## 2021-11-23 NOTE — Telephone Encounter (Signed)
Advanced Heart Failure Patient Advocate Encounter  Forwarded message from reception desk that patient called to follow up on medication issue. Attempted to call the patient again, no way to leave [place], CPhT

## 2021-11-23 NOTE — Telephone Encounter (Signed)
Called and was unable to leave patient a voice message to confirm/remind patient of their appointment at the [facility] on 11/23/21.

## 2021-11-24 ENCOUNTER — Ambulatory Visit (INDEPENDENT_AMBULATORY_CARE_PROVIDER_SITE_OTHER): Payer: Managed Care, Other (non HMO) | Admitting: Neurology

## 2021-11-24 ENCOUNTER — Encounter: Payer: Self-pay | Admitting: Neurology

## 2021-11-24 VITALS — BP 121/75 | HR 51 | Ht 69.0 in | Wt 240.0 lb

## 2021-11-24 DIAGNOSIS — I4819 Other persistent atrial fibrillation: Secondary | ICD-10-CM | POA: Diagnosis not present

## 2021-11-24 DIAGNOSIS — Z8673 Personal history of transient ischemic attack (TIA), and cerebral infarction without residual deficits: Secondary | ICD-10-CM

## 2021-11-24 MED ORDER — COENZYME Q10 30 MG PO CAPS: 100.0000 mg | ORAL_CAPSULE | Freq: Two times a day (BID) | ORAL | 1 refills | Status: DC

## 2021-11-24 NOTE — Patient Instructions (Signed)
I had a long d/w patient about his recent stroke, atrial fibrillation and cardiomyopathy risk for recurrent stroke/TIAs, personally independently reviewed imaging studies and stroke evaluation results and answered questions.Continue Eliquis (apixaban) daily  for secondary stroke prevention and maintain strict control of hypertension with blood pressure goal below 130/90, diabetes with hemoglobin A1c goal below 6.5% and lipids with LDL cholesterol goal below 70 mg/dL. I also advised the patient to eat a healthy diet with plenty of whole grains, cereals, fruits and vegetables, exercise regularly and maintain ideal body weight .patient may also consider participation in the [place] atrial fibrillation stroke prevention is interested and will be given information to review at home and decide.  Followup in the future with me in months or call earlier if necessary

## 2021-11-24 NOTE — Progress Notes (Signed)
[facility] [address] [place]. [place] [address] [phone]       OFFICE FOLLOW-UP NOTE  Eddie Dixon Date of Birth:  May 04, 1965 Medical Record Number:  [id number]   HPI: Initial visit 05/24/2021 Eddie Dixon is a 56 year old male seen today for initial office follow-up visit following hospital admission for stroke in January 2023.  History is obtained from the patient and review of electronic medical records and personally reviewed pertinent available imaging films in PACS.  He has past medical history of chronic atrial fibrillation on Xarelto, congestive heart failure who is admitted with A-fib with rapid ventricular rate, acute kidney injury and lower extremity edema.  He had shortly been started on Xarelto prior to this but had not been taking it due to the cost.  He was in the hospital and had recently undergone a TEE which had shown left atrial appendage clot and hence was not cardioverted.  He was on IV heparin.  He developed sudden onset of right-sided weakness and slurred speech.  He was seen by neuro hospitalist on-call and had an NIH of 9.  He was not a candidate for TNK due to being on heparin and no LVO was found on the CT angio which showed no large vessel stenosis or occlusion.  MRI scan of the brain showed high left parietal cortical as well as coronary radiator embolic infarcts.  2D echo had shown ejection fraction of 20-25% and TEE ejection fraction of 30/35% with positive bubble study.  Cardiac MRI on 02/19/2021 showed LAA thrombus present with mild biatrial enlargement and small possible PFO.  Moderate global hypokinesis with ejection fraction 21%.  LDL cholesterol was 93 mg percent hemoglobin A1c was 5.6.  Patient's weakness improved shortly thereafter during the hospitalization itself.  He was switched to Eliquis and Lipitor was added.  Patient states is done well since discharge.  He is made full recovery.  He has had no recurrent stroke or TIA symptoms.  He  is tolerating Eliquis well without bleeding or bruising.  Blood pressure is usually well controlled at home though today it is elevated in office at 170/95.  He remains on Lipitor 40 mg daily which is tolerating well without muscle aches and pains.  He has not had a follow-up lipid profile checked.  He has no new complaints today.  He did follow-up with his cardiologist Dr. Gwenlyn Found in February. Update 11/24/2021 : He returns for follow-up after last visit 6 months ago.  He is doing well.  He has not had any recurrent or new stroke or TIA symptoms.  He remains on Eliquis which is tolerating well without bleeding or bruising.  His blood pressure is under good control and today it is 121/75.  He has some trouble with Lipitor due to muscle aches and pains and has stopped it briefly for a few weeks upon advice from his primary care physician but is now back on it.  He has no new complaints. ROS:   14 system review of systems is positive for muscle aches and pains, slurred speech, facial weakness, arm weakness and all other systems negative  PMH:  Past Medical History:  Diagnosis Date   A-fib ([place])    Anxiety    ED (erectile dysfunction)    Elevated blood pressure     Social History:  Social History   Socioeconomic History   Marital status: Divorced    Spouse name: Not on file   Number of children: 2   Years of  education: Not on file   Highest education level: Some college, no degree  Occupational History   Occupation: Librarian, academic    Comment: [employer]  Tobacco Use   Smoking status: Never   Smokeless tobacco: Current    Types: Chew  Vaping Use   Vaping Use: Never used  Substance and Sexual Activity   Alcohol use: Yes    Alcohol/week: 17.0 standard drinks of alcohol    Types: 17 Shots of liquor per week    Comment: drinks fifth of liquor per week x 1 year.   Drug use: No   Sexual activity: Yes  Other Topics Concern   Not on file  Social History Narrative   Regular Exercise -  YES          Social Determinants of Health   Financial Resource Strain: High Risk (02/15/2021)   Overall Financial Resource Strain (CARDIA)    Difficulty of Paying Living Expenses: Hard  Food Insecurity: No Food Insecurity (02/15/2021)   Hunger Vital Sign    Worried About Running Out of Food in the Last Year: Never true    Ran Out of Food in the Last Year: Never true  Transportation Needs: No Transportation Needs (02/15/2021)   PRAPARE - Hydrologist (Medical): No    Lack of Transportation (Non-Medical): No  Physical Activity: Not on file  Stress: Not on file  Social Connections: Not on file  Intimate Partner Violence: Not on file    Medications:   Current Outpatient Medications on File Prior to Visit  Medication Sig Dispense Refill   amiodarone (PACERONE) 200 MG tablet Take 1 tablet (200 mg total) by mouth daily for 15 days. Then STOP 15 tablet 0   apixaban (ELIQUIS) 5 MG TABS tablet Take 1 tablet (5 mg total) by mouth 2 (two) times daily. 60 tablet 3   atorvastatin (LIPITOR) 40 MG tablet Take 1 tablet (40 mg total) by mouth every other day. 15 tablet 3   carvedilol (COREG) 6.25 MG tablet Take 1 tablet (6.25 mg total) by mouth 2 (two) times daily with a meal. 60 tablet 3   Cholecalciferol (VITAMIN D3) 50 MCG (2000 UT) capsule Take 1 capsule (2,000 Units total) by mouth daily. 100 capsule 3   dapagliflozin propanediol (FARXIGA) 10 MG TABS tablet Take 1 tablet (10 mg total) by mouth daily. 30 tablet 3   losartan (COZAAR) 50 MG tablet Take 1 tablet (50 mg total) by mouth daily. 90 tablet 3   Multiple Vitamins-Minerals (MULTIVITAMIN ADULTS) TABS Take 1 tablet by mouth daily.     spironolactone (ALDACTONE) 25 MG tablet Take 1/2 tablet (12.5 mg total) by mouth daily. 15 tablet 3   tadalafil (CIALIS) 5 MG tablet Take 1 tablet (5 mg total) by mouth daily. (Patient taking differently: Take 5 mg by mouth daily as needed.) 90 tablet 3   torsemide (DEMADEX) 20 MG tablet Take 1  tablet (20 mg total) by mouth daily as needed (for weight gain > 3 lb in 24 hrs or 5 lb over dry weight). 30 tablet 3   traZODone (DESYREL) 50 MG tablet Take 0.5-1 tablets (25-50 mg total) by mouth at bedtime as needed for sleep. 30 tablet 5   No current facility-administered medications on file prior to visit.    Allergies:  No Known Allergies  Physical Exam General: well developed, well nourished pleasant middle-age male, seated, in no evident distress Head: head normocephalic and atraumatic.  Neck: supple with no carotid or  supraclavicular bruits Cardiovascular: regular rate and rhythm, no murmurs Musculoskeletal: no deformity Skin:  no rash/petichiae Vascular:  Normal pulses all extremities Vitals:   11/24/21 1318  BP: 121/75  Pulse: (!) 51   Neurologic Exam Mental Status: Awake and fully alert. Oriented to place and time. Recent and remote memory intact. Attention span, concentration and fund of knowledge appropriate. Mood and affect appropriate.  Cranial Nerves: Fundoscopic exam not done. Pupils equal, briskly reactive to light. Extraocular movements full without nystagmus. Visual fields full to confrontation. Hearing intact. Facial sensation intact. Face, tongue, palate moves normally and symmetrically.  Motor: Normal bulk and tone. Normal strength in all tested extremity muscles. Sensory.: intact to touch ,pinprick .position and vibratory sensation.  Coordination: Rapid alternating movements normal in all extremities. Finger-to-nose and heel-to-shin performed accurately bilaterally. Gait and Station: Arises from chair without difficulty. Stance is normal. Gait demonstrates normal stride length and balance . Able to heel, toe and tandem walk without difficulty.  Reflexes: 1+ and symmetric. Toes downgoing.       ASSESSMENT: 56 year male with left pareital MCA branch infarct in January 99991111 of cardioembolic etiology  from AFIB and cardiomyopathy.  He is doing well without  recurrent symptoms and no residual deficits.     PLAN:I had a long d/w patient about his recent stroke, atrial fibrillation and cardiomyopathy risk for recurrent stroke/TIAs, personally independently reviewed imaging studies and stroke evaluation results and answered questions.Continue Eliquis (apixaban) daily  for secondary stroke prevention and maintain strict control of hypertension with blood pressure goal below 130/90, diabetes with hemoglobin A1c goal below 6.5% and lipids with LDL cholesterol goal below 70 mg/dL. I also advised the patient to eat a healthy diet with plenty of whole grains, cereals, fruits and vegetables, exercise regularly and maintain ideal body weight .patient may also consider participation in the [place] atrial fibrillation stroke prevention is interested and will be given information to review at home and decide.  Recommend he take Coq.10 200 mg daily to help with statin myalgia.  Followup in the future with me in months or call earlier if necessaryGreater than 50% of time during this 35 minute visit was spent on counseling,explanation of diagnosis of stroke, AFIB, planning of further management, discussion with patient and family and coordination of care. Antony Contras, MD Note: This document was prepared with digital dictation and possible smart phrase technology. Any transcriptional errors that result from this process are unintentional

## 2021-12-07 ENCOUNTER — Ambulatory Visit: Payer: Managed Care, Other (non HMO) | Admitting: Cardiovascular Disease

## 2022-01-07 ENCOUNTER — Encounter: Payer: Self-pay | Admitting: Cardiovascular Disease

## 2022-01-07 ENCOUNTER — Ambulatory Visit: Payer: Managed Care, Other (non HMO) | Attending: Cardiovascular Disease | Admitting: Cardiovascular Disease

## 2022-01-07 VITALS — BP 120/72 | HR 71 | Ht 69.0 in | Wt 240.0 lb

## 2022-01-07 DIAGNOSIS — Z789 Other specified health status: Secondary | ICD-10-CM | POA: Diagnosis not present

## 2022-01-07 DIAGNOSIS — E785 Hyperlipidemia, unspecified: Secondary | ICD-10-CM | POA: Insufficient documentation

## 2022-01-07 DIAGNOSIS — E782 Mixed hyperlipidemia: Secondary | ICD-10-CM | POA: Diagnosis not present

## 2022-01-07 DIAGNOSIS — I4891 Unspecified atrial fibrillation: Secondary | ICD-10-CM

## 2022-01-07 DIAGNOSIS — F1090 Alcohol use, unspecified, uncomplicated: Secondary | ICD-10-CM

## 2022-01-07 DIAGNOSIS — I5021 Acute systolic (congestive) heart failure: Secondary | ICD-10-CM

## 2022-01-07 NOTE — Assessment & Plan Note (Signed)
History of nonischemic cardiomyopathy with initial EF of 20 to 25% 02/14/2021.  After cardioversion and treatment with guideline directed medical therapy his EF improved 05/26/2021 up to 50 to 55%.  He is asymptomatic.

## 2022-01-07 NOTE — Assessment & Plan Note (Signed)
Significantly reduced alcohol intake since his index event.  He still drinks 1-2 beers when he gets home from work at night.

## 2022-01-07 NOTE — Assessment & Plan Note (Signed)
History of PAF potentially related to alcohol status post chemical conversion to sinus rhythm on amiodarone currently on Eliquis without recurrence.

## 2022-01-07 NOTE — Progress Notes (Signed)
01/07/2022 Eddie Dixon   12-15-1965  [id number]  Primary Physician Plotnikov, Evie Lacks, MD Primary Cardiologist: Lorretta Harp MD Eddie Dixon, [place]  HPI:  Eddie Dixon is a 56 y.o.  mildly overweight married male father of 2 children who works as a Engineer, manufacturing systems.  He works second shift.  He was referred by Dr. Alain Marion, his PCP, for evaluation of newly recognized A. fib with RVR.  I last saw him in the office 06/09/2021.  He has no cardiac risk factors other than a premature family history of CAD.  His father died of a heart attack at age 11.  He has never had a heart attack or stroke.  He has complaints of dyspnea on exertion over the last several months.  He was found to be in A. fib yesterday by Dr. Alain Marion who began him on a low-dose beta-blocker and Xarelto. The CHA2DSVASC2 score is  0 .  He was admitted to the hospital approximately 2 weeks after he saw me in heart failure.  He was diuresed and underwent right left heart cath by Dr. Haroldine Laws revealing no evidence of CAD.  He underwent TEE guided attempted cardioversion by Dr. Johnsie Cancel who demonstrated a left atrial appendage thrombus and therefore cardioversion was not performed.  He was placed on amiodarone and guideline directed optimal medical therapy.  He has limited his ethanol intake and has been more compliant with his medications.  He feels clinically improved.  He is scheduled for cardioversion by Dr. Haroldine Laws on 03/26/2021.  He spontaneously converted to sinus rhythm on amiodarone.  He remains on Eliquis.  He denies chest pain or shortness of breath.  His most recent echo performed 05/26/2021 revealed normalization of his LV function with an EF in the 50 to 55% range, grade 2 diastolic dysfunction.  He has limited his salt intake as well.    Since I saw him several months ago he continues to do well.  He still working his second shift.  He has gained about 10 pounds although he does not appear wet on exam.  He  denies chest pain or shortness of breath.  Current Meds  Medication Sig   amiodarone (PACERONE) 200 MG tablet Take 1 tablet (200 mg total) by mouth daily for 15 days. Then STOP   apixaban (ELIQUIS) 5 MG TABS tablet Take 1 tablet (5 mg total) by mouth 2 (two) times daily.   atorvastatin (LIPITOR) 40 MG tablet Take 1 tablet (40 mg total) by mouth every other day.   carvedilol (COREG) 6.25 MG tablet Take 1 tablet (6.25 mg total) by mouth 2 (two) times daily with a meal.   Cholecalciferol (VITAMIN D3) 50 MCG (2000 UT) capsule Take 1 capsule (2,000 Units total) by mouth daily.   co-enzyme Q-10 30 MG capsule Take 3 capsules (90 mg total) by mouth 2 (two) times daily.   dapagliflozin propanediol (FARXIGA) 10 MG TABS tablet Take 1 tablet (10 mg total) by mouth daily.   losartan (COZAAR) 50 MG tablet Take 1 tablet (50 mg total) by mouth daily.   Multiple Vitamins-Minerals (MULTIVITAMIN ADULTS) TABS Take 1 tablet by mouth daily.   spironolactone (ALDACTONE) 25 MG tablet Take 1/2 tablet (12.5 mg total) by mouth daily.   tadalafil (CIALIS) 5 MG tablet Take 1 tablet (5 mg total) by mouth daily. (Patient taking differently: Take 5 mg by mouth daily as needed.)   torsemide (DEMADEX) 20 MG tablet Take 1 tablet (20 mg total) by mouth daily as  needed (for weight gain > 3 lb in 24 hrs or 5 lb over dry weight).   traZODone (DESYREL) 50 MG tablet Take 0.5-1 tablets (25-50 mg total) by mouth at bedtime as needed for sleep.     No Known Allergies  Social History   Socioeconomic History   Marital status: Divorced    Spouse name: Not on file   Number of children: 2   Years of education: Not on file   Highest education level: Some college, no degree  Occupational History   Occupation: Merchandiser, retail    Comment: [employer]  Tobacco Use   Smoking status: Never   Smokeless tobacco: Current    Types: Chew  Vaping Use   Vaping Use: Never used  Substance and Sexual Activity   Alcohol use: Yes    Alcohol/week: 17.0  standard drinks of alcohol    Types: 17 Shots of liquor per week    Comment: drinks fifth of liquor per week x 1 year.   Drug use: No   Sexual activity: Yes  Other Topics Concern   Not on file  Social History Narrative   Regular Exercise -  YES         Social Determinants of Health   Financial Resource Strain: High Risk (02/15/2021)   Overall Financial Resource Strain (CARDIA)    Difficulty of Paying Living Expenses: Hard  Food Insecurity: No Food Insecurity (02/15/2021)   Hunger Vital Sign    Worried About Running Out of Food in the Last Year: Never true    Ran Out of Food in the Last Year: Never true  Transportation Needs: No Transportation Needs (02/15/2021)   PRAPARE - Administrator, Civil Service (Medical): No    Lack of Transportation (Non-Medical): No  Physical Activity: Not on file  Stress: Not on file  Social Connections: Not on file  Intimate Partner Violence: Not on file     Review of Systems: General: negative for chills, fever, night sweats or weight changes.  Cardiovascular: negative for chest pain, dyspnea on exertion, edema, orthopnea, palpitations, paroxysmal nocturnal dyspnea or shortness of breath Dermatological: negative for rash Respiratory: negative for cough or wheezing Urologic: negative for hematuria Abdominal: negative for nausea, vomiting, diarrhea, bright red blood per rectum, melena, or hematemesis Neurologic: negative for visual changes, syncope, or dizziness All other systems reviewed and are otherwise negative except as noted above.    Blood pressure 120/72, pulse 71, height 5\' 9"  (1.753 m), weight 240 lb (108.9 kg), SpO2 98 %.  General appearance: alert and no distress Neck: no adenopathy, no carotid bruit, no JVD, supple, symmetrical, trachea midline, and thyroid not enlarged, symmetric, no tenderness/mass/nodules Lungs: clear to auscultation bilaterally Heart: regular rate and rhythm, S1, S2 normal, no murmur, click, rub or  gallop Extremities: extremities normal, atraumatic, no cyanosis or edema Pulses: 2+ and symmetric Skin: Skin color, texture, turgor normal. No rashes or lesions Neurologic: Grossly normal  EKG not performed today  ASSESSMENT AND PLAN:   Atrial fibrillation with RVR (HCC) History of PAF potentially related to alcohol status post chemical conversion to sinus rhythm on amiodarone currently on Eliquis without recurrence.  Acute HFrEF (heart failure with reduced ejection fraction) (HCC) History of nonischemic cardiomyopathy with initial EF of 20 to 25% 02/14/2021.  After cardioversion and treatment with guideline directed medical therapy his EF improved 05/26/2021 up to 50 to 55%.  He is asymptomatic.  Alcohol use Significantly reduced alcohol intake since his index event.  He still  drinks 1-2 beers when he gets home from work at night.  Hyperlipidemia History of hyperlipidemia on atorvastatin with lipid profile performed 09/06/2021 revealing total cholesterol of 152, LDL 64 and HDL of 72.     Lorretta Harp MD FACP,FACC,FAHA, [facility] 01/07/2022 2:30 PM

## 2022-01-07 NOTE — Assessment & Plan Note (Signed)
History of hyperlipidemia on atorvastatin with lipid profile performed 09/06/2021 revealing total cholesterol of 152, LDL 64 and HDL of 72.

## 2022-01-07 NOTE — Patient Instructions (Signed)
Medication Instructions:  Your physician recommends that you continue on your current medications as directed. Please refer to the Current Medication list given to you today.  *If you need a refill on your cardiac medications before your next appointment, please call your pharmacy*   Follow-Up: At [facility], you and your health needs are our priority.  As part of our continuing mission to provide you with exceptional heart care, we have created designated Provider Care Teams.  These Care Teams include your primary Cardiologist (physician) and Advanced Practice Providers (APPs -  Physician Assistants and Nurse Practitioners) who all work together to provide you with the care you need, when you need it.  We recommend signing up for the patient portal called "MyChart".  Sign up information is provided on this After Visit Summary.  MyChart is used to connect with patients for Virtual Visits (Telemedicine).  Patients are able to view lab/test results, encounter notes, upcoming appointments, etc.  Non-urgent messages can be sent to your provider as well.   To learn more about what you can do with MyChart, go to [email or web address].com.    Your next appointment:   6 month(s)  The format for your next appointment:   In Person  Provider:   Angela Duke, PA-C, Callie Goodrich, PA-C, Jennifer, Lambert, PA-C, Kathryn Lawrence, DNP, ANP, Hao Meng, PA-C, or Emily Monge, NP       Then, Jonathan Berry, MD  will plan to see you again in 12 month(s).  

## 2022-01-11 ENCOUNTER — Telehealth: Payer: Self-pay | Admitting: Internal Medicine

## 2022-01-11 MED ORDER — TADALAFIL 5 MG PO TABS: 5.0000 mg | ORAL_TABLET | Freq: Every day | ORAL | 1 refills | Status: DC

## 2022-01-11 NOTE — Telephone Encounter (Signed)
Caller & Relationship to patient:   Call back number: [phone]   Date of last office visit:  09/06/2021   Date of next office visit:   Medication(s) to be refilled:  Cialis        Preferred Pharmacy:  [facility] on [address], [place]

## 2022-01-11 NOTE — Telephone Encounter (Signed)
Refill has [facility] to [facility] 

## 2022-02-24 ENCOUNTER — Encounter: Payer: Self-pay | Admitting: Internal Medicine

## 2022-02-24 ENCOUNTER — Telehealth: Payer: Managed Care, Other (non HMO) | Admitting: Internal Medicine

## 2022-02-24 DIAGNOSIS — I5021 Acute systolic (congestive) heart failure: Secondary | ICD-10-CM | POA: Diagnosis not present

## 2022-02-24 DIAGNOSIS — I4891 Unspecified atrial fibrillation: Secondary | ICD-10-CM

## 2022-02-24 DIAGNOSIS — Z1211 Encounter for screening for malignant neoplasm of colon: Secondary | ICD-10-CM

## 2022-02-24 MED ORDER — DAPAGLIFLOZIN PROPANEDIOL 10 MG PO TABS: 10.0000 mg | ORAL_TABLET | Freq: Every day | ORAL | 3 refills | Status: DC

## 2022-02-24 NOTE — Assessment & Plan Note (Signed)
On Eliquis, Farxiga 

## 2022-02-24 NOTE — Progress Notes (Signed)
Virtual Visit via Video Note  I connected with Eddie Dixon on 02/24/22 at 11:00 AM EST by a video enabled telemedicine application and verified that I am speaking with the correct person using two identifiers.   I discussed the limitations of evaluation and management by telemedicine and the availability of in person appointments. The patient expressed understanding and agreed to proceed.  I was located at our [facility] office. The patient was at home. There was no one else present in the visit.  No chief complaint on file.    History of Present Illness:  F/u on CHF - need Farxiga refilled; anticoagulated w/Eliquis Asking for colonoscopy  Review of Systems  Constitutional:  Negative for fever.  HENT:  Negative for congestion.   Cardiovascular:  Negative for chest pain.  Gastrointestinal:  Negative for abdominal pain, blood in stool and constipation.     Observations/Objective: The patient appears to be in no acute distress  Assessment and Plan:  Problem List Items Addressed This Visit       Cardiovascular and Mediastinum   Acute HFrEF (heart failure with reduced ejection fraction) (HCC)    On Eliquis, Farxiga      Atrial fibrillation with RVR ([place])    On Eliquis, Farxiga      Other Visit Diagnoses     Colon cancer screening    -  Primary   Relevant Orders   Ambulatory referral to Gastroenterology        Meds ordered this encounter  Medications   dapagliflozin propanediol (FARXIGA) 10 MG TABS tablet    Sig: Take 1 tablet (10 mg total) by mouth daily.    Dispense:  90 tablet    Refill:  3     Follow Up Instructions:    I discussed the assessment and treatment plan with the patient. The patient was provided an opportunity to ask questions and all were answered. The patient agreed with the plan and demonstrated an understanding of the instructions.   The patient was advised to call back or seek an in-person evaluation if the symptoms worsen or if  the condition fails to improve as anticipated.  I provided face-to-face time during this encounter. We were at different locations.   Walker Kehr, MD

## 2022-02-24 NOTE — Assessment & Plan Note (Signed)
On Eliquis, Farxiga

## 2022-03-10 ENCOUNTER — Telehealth (HOSPITAL_COMMUNITY): Payer: Self-pay

## 2022-03-10 MED ORDER — DAPAGLIFLOZIN PROPANEDIOL 10 MG PO TABS
10.0000 mg | ORAL_TABLET | Freq: Every day | ORAL | 3 refills | Status: DC
Start: 2022-03-10 — End: 2022-05-16

## 2022-03-10 NOTE — Telephone Encounter (Signed)
refill 

## 2022-03-18 ENCOUNTER — Telehealth: Payer: Self-pay | Admitting: Cardiovascular Disease

## 2022-03-18 ENCOUNTER — Other Ambulatory Visit: Payer: Self-pay | Admitting: Cardiovascular Disease

## 2022-03-18 MED ORDER — APIXABAN 5 MG PO TABS: 5.0000 mg | ORAL_TABLET | Freq: Two times a day (BID) | ORAL | 0 refills | Status: DC

## 2022-03-18 MED ORDER — APIXABAN 5 MG PO TABS: 5.0000 mg | ORAL_TABLET | Freq: Two times a day (BID) | ORAL | 5 refills | Status: DC

## 2022-03-18 NOTE — Telephone Encounter (Signed)
Pt c/o medication issue:  1. Name of Medication: apixaban (ELIQUIS) 5 MG TABS tablet   2. How are you currently taking this medication (dosage and times per day)?   Take 1 tablet (5 mg total) by mouth 2 (two) times daily.    3. Are you having a reaction (difficulty breathing--STAT)? No  4. What is your medication issue? Pt states he's been on eliquis for over a yr. He states that his hands get numb and cold, when there is a chill in the air. He says this will last 45 mins to an hour. His hands will get really pale. He's wanting to know if its a side affect. He did state that the last 2 months he's been taking this medicine once a day.

## 2022-03-18 NOTE — Telephone Encounter (Signed)
Called, no answer, left message

## 2022-03-18 NOTE — Addendum Note (Signed)
Addended by: Lutricia Feil on: 03/18/2022 03:16 PM   Modules accepted: Orders

## 2022-03-18 NOTE — Telephone Encounter (Signed)
*  STAT* If patient is at the pharmacy, call can be transferred to refill team.   1. Which medications need to be refilled? (please list name of each medication and dose if known) apixaban (ELIQUIS) 5 MG TABS tablet   2. Which pharmacy/location (including street and city if local pharmacy) is medication to be sent to?  [id number] [place], [place] - [address]   3. Do they need a 30 day or 90 day supply? 90 day  Pt states that the pharmacy needs a new discount card for this medicine because without it, the medicine is $500.

## 2022-03-18 NOTE — Telephone Encounter (Signed)
Spoke with pt. Pt needs a refill on Eliquis 5 mg bid #90 day supply. $10 Copay card information was given over the phone to pt. [facility] [id number], [place], [id number], ID [id number], Eliquis # [id number]. Pt will call to activate the copay card and will call the pharmacy to give them the updated copay card information.

## 2022-03-18 NOTE — Telephone Encounter (Signed)
Prescription refill request for Eliquis received. Indication: afib  Last office visit: Eddie Dixon 01/07/2022 Scr: 1.21, 11/23/2021 Age: 57 Weight: 108.9 kg   Refill sent.

## 2022-03-21 ENCOUNTER — Encounter: Payer: Self-pay | Admitting: Emergency Medicine

## 2022-03-21 NOTE — Telephone Encounter (Signed)
No answer, left a voicemail saying that Dr Gwenlyn Found wants you to continue taking Eliquis '5mg'$  twice a day. Also, that he can follow up with his PCP about his symptoms. Left call back number. Also sent this information in a [employer].

## 2022-03-30 NOTE — Telephone Encounter (Signed)
Left message for pt to continue his eliquis twice daily. Instructed pt to call back with questions or concerns.

## 2022-05-13 ENCOUNTER — Other Ambulatory Visit (HOSPITAL_COMMUNITY): Payer: Self-pay | Admitting: Family Medicine

## 2022-05-13 ENCOUNTER — Other Ambulatory Visit (HOSPITAL_COMMUNITY): Payer: Self-pay | Admitting: Internal Medicine

## 2022-05-13 ENCOUNTER — Other Ambulatory Visit (HOSPITAL_COMMUNITY): Payer: Self-pay

## 2022-05-13 MED ORDER — SPIRONOLACTONE 25 MG PO TABS: 12.5000 mg | ORAL_TABLET | Freq: Every day | ORAL | 0 refills | Status: DC

## 2022-05-13 MED ORDER — TORSEMIDE 20 MG PO TABS: 20.0000 mg | ORAL_TABLET | Freq: Every day | ORAL | 3 refills | Status: DC | PRN

## 2022-05-16 ENCOUNTER — Other Ambulatory Visit (HOSPITAL_COMMUNITY): Payer: Self-pay | Admitting: Cardiology

## 2022-05-16 ENCOUNTER — Other Ambulatory Visit (HOSPITAL_COMMUNITY): Payer: Self-pay

## 2022-05-16 ENCOUNTER — Telehealth (HOSPITAL_COMMUNITY): Payer: Self-pay

## 2022-05-16 MED ORDER — DAPAGLIFLOZIN PROPANEDIOL 10 MG PO TABS: 10.0000 mg | ORAL_TABLET | Freq: Every day | ORAL | 3 refills | Status: DC

## 2022-05-16 NOTE — Telephone Encounter (Signed)
Patient Advocate Encounter  Returned patient call regarding costs for Eliquis, [place]. [facility] was [facility] on the correct copay card and confirmed $30 copay. Eddie Dixon requires 90 days per insurance, new rx being sent to pharmacy.  Left voicemail for patient to call back if further issues arise.  Burnell Blanks, CPhT Rx Patient Advocate Phone: [phone]

## 2022-05-25 ENCOUNTER — Ambulatory Visit (INDEPENDENT_AMBULATORY_CARE_PROVIDER_SITE_OTHER): Payer: Managed Care, Other (non HMO) | Admitting: Neurology

## 2022-05-25 ENCOUNTER — Encounter: Payer: Self-pay | Admitting: Neurology

## 2022-05-25 VITALS — BP 135/82 | HR 61 | Ht 69.0 in | Wt 238.2 lb

## 2022-05-25 DIAGNOSIS — I6300 Cerebral infarction due to thrombosis of unspecified precerebral artery: Secondary | ICD-10-CM

## 2022-05-25 DIAGNOSIS — I480 Paroxysmal atrial fibrillation: Secondary | ICD-10-CM

## 2022-05-25 NOTE — Progress Notes (Signed)
Lipids checked.  He has no new complaints. [facility] [address] [place]. [place] [address] [phone]       OFFICE FOLLOW-UP NOTE  [name] Date of Birth:  1965/09/05 Medical Record Number:  [id number]   HPI: Initial visit 05/24/2021 Eddie Dixon is a 57 year old male seen today for initial office follow-up visit following hospital admission for stroke in January 2023.  History is obtained from the patient and review of electronic medical records and personally reviewed pertinent available imaging films in PACS.  He has past medical history of chronic atrial fibrillation on Xarelto, congestive heart failure who is admitted with A-fib with rapid ventricular rate, acute kidney injury and lower extremity edema.  He had shortly been started on Xarelto prior to this but had not been taking it due to the cost.  He was in the hospital and had recently undergone a TEE which had shown left atrial appendage clot and hence was not cardioverted.  He was on IV heparin.  He developed sudden onset of right-sided weakness and slurred speech.  He was seen by neuro hospitalist on-call and had an NIH of 9.  He was not a candidate for TNK due to being on heparin and no LVO was found on the CT angio which showed no large vessel stenosis or occlusion.  MRI scan of the brain showed high left parietal cortical as well as coronary radiator embolic infarcts.  2D echo had shown ejection fraction of 20-25% and TEE ejection fraction of 30/35% with positive bubble study.  Cardiac MRI on 02/19/2021 showed LAA thrombus present with mild biatrial enlargement and small possible PFO.  Moderate global hypokinesis with ejection fraction 21%.  LDL cholesterol was 93 mg percent hemoglobin A1c was 5.6.  Patient's weakness improved shortly thereafter during the hospitalization itself.  He was switched to Eliquis and Lipitor was added.  Patient states is done well since discharge.  He is made full recovery.  He has  had no recurrent stroke or TIA symptoms.  He is tolerating Eliquis well without bleeding or bruising.  Blood pressure is usually well controlled at home though today it is elevated in office at 170/95.  He remains on Lipitor 40 mg daily which is tolerating well without muscle aches and pains.  He has not had a follow-up lipid profile checked.  He has no new complaints today.  He did follow-up with his cardiologist Dr. Allyson Sabal in February. Update 11/24/2021 : He returns for follow-up after last visit 6 months ago.  He is doing well.  He has not had any recurrent or new stroke or TIA symptoms.  He remains on Eliquis which is tolerating well without bleeding or bruising.  His blood pressure is under good control and today it is 121/75.  He has some trouble with Lipitor due to muscle aches and pains and has stopped it briefly for a few weeks upon advice from his primary care physician but is now back on it.  He has no new complaints. Update 05/25/2022 : He returns for follow-up after last visit 6 months ago.  He continues to do well.  He has not had recurrent stroke or TIA symptoms.  He remains on Eliquis and is tolerating well with minor bruising but no bleeding or strokes.  Blood pressure under good control.  He is tolerating Lipitor well without any side effects.  He has no new complaints. ROS:   14 system review of systems is positive for muscle aches and pains,  slurred speech, facial weakness, arm weakness and all other systems negative  PMH:  Past Medical History:  Diagnosis Date   A-fib (HCC)    Anxiety    ED (erectile dysfunction)    Elevated blood pressure     Social History:  Social History   Socioeconomic History   Marital status: Divorced    Spouse name: Not on file   Number of children: 2   Years of education: Not on file   Highest education level: Some college, no degree  Occupational History   Occupation: Merchandiser, retail    Comment: [employer]  Tobacco Use   Smoking status: Never    Smokeless tobacco: Current    Types: Chew  Vaping Use   Vaping Use: Never used  Substance and Sexual Activity   Alcohol use: Yes    Alcohol/week: 17.0 standard drinks of alcohol    Types: 17 Shots of liquor per week    Comment: drinks fifth of liquor per week x 1 year.   Drug use: No   Sexual activity: Yes  Other Topics Concern   Not on file  Social History Narrative   Regular Exercise -  YES         Social Determinants of Health   Financial Resource Strain: High Risk (02/15/2021)   Overall Financial Resource Strain (CARDIA)    Difficulty of Paying Living Expenses: Hard  Food Insecurity: No Food Insecurity (02/15/2021)   Hunger Vital Sign    Worried About Running Out of Food in the Last Year: Never true    Ran Out of Food in the Last Year: Never true  Transportation Needs: No Transportation Needs (02/15/2021)   PRAPARE - Administrator, Civil Service (Medical): No    Lack of Transportation (Non-Medical): No  Physical Activity: Not on file  Stress: Not on file  Social Connections: Not on file  Intimate Partner Violence: Not on file    Medications:   Current Outpatient Medications on File Prior to Visit  Medication Sig Dispense Refill   apixaban (ELIQUIS) 5 MG TABS tablet Take 1 tablet (5 mg total) by mouth 2 (two) times daily. 180 tablet 0   atorvastatin (LIPITOR) 40 MG tablet Take 1 tablet (40 mg total) by mouth daily. NEEDS FOLLOW UP APPOINTMENT FOR MORE REFILLS 90 tablet 0   carvedilol (COREG) 6.25 MG tablet Take 1 tablet (6.25 mg total) by mouth 2 (two) times daily with a meal. 60 tablet 3   Cholecalciferol (VITAMIN D3) 50 MCG (2000 UT) capsule Take 1 capsule (2,000 Units total) by mouth daily. 100 capsule 3   co-enzyme Q-10 30 MG capsule Take 3 capsules (90 mg total) by mouth 2 (two) times daily. 1 capsule 1   dapagliflozin propanediol (FARXIGA) 10 MG TABS tablet Take 1 tablet (10 mg total) by mouth daily. 90 tablet 3   losartan (COZAAR) 50 MG tablet Take 1  tablet (50 mg total) by mouth daily. 90 tablet 3   metoprolol succinate (TOPROL-XL) 25 MG 24 hr tablet Take 25 mg by mouth daily.     Multiple Vitamins-Minerals (MULTIVITAMIN ADULTS) TABS Take 1 tablet by mouth daily.     spironolactone (ALDACTONE) 25 MG tablet Take 0.5 tablets (12.5 mg total) by mouth daily. NEEDS FOLLOW UP APPOINTMENT FOR MORE REFILLS 45 tablet 0   tadalafil (CIALIS) 5 MG tablet Take 1 tablet (5 mg total) by mouth daily. 90 tablet 1   torsemide (DEMADEX) 20 MG tablet Take 1 tablet (20 mg total) by  mouth daily as needed (for weight gain > 3 lb in 24 hrs or 5 lb over dry weight). 30 tablet 3   traZODone (DESYREL) 50 MG tablet Take 0.5-1 tablets (25-50 mg total) by mouth at bedtime as needed for sleep. 30 tablet 5   amiodarone (PACERONE) 200 MG tablet Take 1 tablet (200 mg total) by mouth daily for 15 days. Then STOP 15 tablet 0   No current facility-administered medications on file prior to visit.    Allergies:  No Known Allergies  Physical Exam General: well developed, well nourished pleasant middle-age African-American male, seated, in no evident distress Head: head normocephalic and atraumatic.  Neck: supple with no carotid or supraclavicular bruits Cardiovascular: regular rate and rhythm, no murmurs Musculoskeletal: no deformity Skin:  no rash/petichiae Vascular:  Normal pulses all extremities Vitals:   05/25/22 1307  BP: 135/82  Pulse: 61   Neurologic Exam Mental Status: Awake and fully alert. Oriented to place and time. Recent and remote memory intact. Attention span, concentration and fund of knowledge appropriate. Mood and affect appropriate.  Cranial Nerves: Fundoscopic exam not done. Pupils equal, briskly reactive to light. Extraocular movements full without nystagmus. Visual fields full to confrontation. Hearing intact. Facial sensation intact. Face, tongue, palate moves normally and symmetrically.  Motor: Normal bulk and tone. Normal strength in all tested  extremity muscles. Sensory.: intact to touch ,pinprick .position and vibratory sensation.  Coordination: Rapid alternating movements normal in all extremities. Finger-to-nose and heel-to-shin performed accurately bilaterally. Gait and Station: Arises from chair without difficulty. Stance is normal. Gait demonstrates normal stride length and balance . Able to heel, toe and tandem walk without difficulty.  Reflexes: 1+ and symmetric. Toes downgoing.       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  He is doing well without recurrent symptoms and no residual deficits.     PLAN:I had a long d/w patient about his recent stroke, atrial fibrillation and cardiomyopathy risk for recurrent stroke/TIAs, personally independently reviewed imaging studies and stroke evaluation results and answered questions.Continue Eliquis (apixaban) daily  for secondary stroke prevention and maintain strict control of hypertension with blood pressure goal below 130/90, diabetes with hemoglobin A1c goal below 6.5% and lipids with LDL cholesterol goal below 70 mg/dL. I also advised the patient to eat a healthy diet with plenty of whole grains, cereals, fruits and vegetables, exercise regularly and maintain ideal body weight .  Recommend check lipid profile and hemoglobin A1c today.Follow-up with patient in 1 year or call earlier if necessary. Greater than 50% of time during this 35 minute visit was spent on counseling,explanation of diagnosis of stroke, AFIB, planning of further management, discussion with patient and family and coordination of care. Delia Heady, MD Note: This document was prepared with digital dictation and possible smart phrase technology. Any transcriptional errors that result from this process are unintentional

## 2022-05-25 NOTE — Patient Instructions (Signed)
I had a long d/w patient about his recent stroke, atrial fibrillation and cardiomyopathy risk for recurrent stroke/TIAs, personally independently reviewed imaging studies and stroke evaluation results and answered questions.Continue Eliquis (apixaban) daily  for secondary stroke prevention and maintain strict control of hypertension with blood pressure goal below 130/90, diabetes with hemoglobin A1c goal below 6.5% and lipids with LDL cholesterol goal below 70 mg/dL. I also advised the patient to eat a healthy diet with plenty of whole grains, cereals, fruits and vegetables, exercise regularly and maintain ideal body weight .  Recommend check lipid profile and hemoglobin A1c today. Follow-up with patient in 1 year or call earlier if necessary.

## 2022-05-26 LAB — LIPID PANEL
Chol/HDL Ratio: 2.4 ratio (ref 0.0–5.0)
Cholesterol, Total: 181 mg/dL (ref 100–199)
HDL: 77 mg/dL (ref >39)
LDL Chol Calc (NIH): 87 mg/dL (ref 0–99)
Triglycerides: 97 mg/dL (ref 0–149)
VLDL Cholesterol Cal: 17 mg/dL (ref 5–40)

## 2022-05-26 LAB — HEMOGLOBIN A1C
Est. average glucose Bld gHb Est-mCnc: 108 mg/dL
Hgb A1c MFr Bld: 5.4 % (ref 4.8–5.6)

## 2022-05-27 NOTE — Progress Notes (Signed)
Kindly inform the patient that screening test for diabetes is satisfactory.  Cholesterol profile is mostly okay except bad cholesterol is slightly high.  Ensure that you take your cholesterol medication regularly and does not miss any doses.

## 2022-05-30 ENCOUNTER — Telehealth: Payer: Self-pay

## 2022-05-30 NOTE — Telephone Encounter (Signed)
Called pt and LVM #1  "If patient calls back please advise that per Dr. Pearlean Brownie "inform the patient that screening test for diabetes is satisfactory.  Cholesterol profile is mostly okay except bad cholesterol is slightly high.  Ensure that you take your cholesterol medication regularly and does not miss any doses."

## 2022-05-30 NOTE — Telephone Encounter (Signed)
-----   Message from Deatra James, RN sent at 05/30/2022  9:36 AM EDT -----  ----- Message ----- From: Micki Riley, MD Sent: 05/27/2022   8:40 AM EDT To: Gna-Pod 2 Results  Kindly inform the patient that screening test for diabetes is satisfactory.  Cholesterol profile is mostly okay except bad cholesterol is slightly high.  Ensure that you take your cholesterol medication regularly and does not miss any doses.

## 2022-07-04 ENCOUNTER — Encounter: Payer: Self-pay | Admitting: Physician Assistant

## 2022-08-05 ENCOUNTER — Other Ambulatory Visit (HOSPITAL_COMMUNITY): Payer: Self-pay | Admitting: Family Medicine

## 2022-09-06 ENCOUNTER — Telehealth: Payer: Self-pay | Admitting: Internal Medicine

## 2022-09-06 MED ORDER — TADALAFIL 5 MG PO TABS: 5.0000 mg | ORAL_TABLET | Freq: Every day | ORAL | 0 refills | Status: DC

## 2022-09-06 NOTE — Telephone Encounter (Signed)
[facility] until appt.Marland KitchenJohny Chess

## 2022-09-06 NOTE — Telephone Encounter (Signed)
Patient has been scheduled for OV on 09/20/2022. He has also had a VV between last in person appointment and now.   Prescription Request  09/06/2022  LOV: 09/06/2021  What is the name of the medication or equipment?  tadalafil (CIALIS) 5 MG tablet  Have you contacted your pharmacy to request a refill? Yes   Which pharmacy would you like this sent to?  [id number] - [place], [place] - [address] AT [facility] [address] [place] [address] Phone: [phone] Fax: [phone]    Patient notified that their request is being sent to the clinical staff for review and that they should receive a response within 2 business days.   Please advise at Mobile [phone] (mobile)

## 2022-09-20 ENCOUNTER — Encounter: Payer: Managed Care, Other (non HMO) | Admitting: Internal Medicine

## 2022-09-23 ENCOUNTER — Ambulatory Visit: Payer: Managed Care, Other (non HMO) | Admitting: Physician Assistant

## 2022-10-03 ENCOUNTER — Other Ambulatory Visit: Payer: Self-pay | Admitting: Internal Medicine

## 2022-11-08 ENCOUNTER — Telehealth: Payer: Self-pay | Admitting: Internal Medicine

## 2022-11-08 NOTE — Telephone Encounter (Signed)
Prescription Request  11/08/2022  LOV: Visit date not found  What is the name of the medication or equipment? tadalifil  Have you contacted your pharmacy to request a refill? Yes   Which pharmacy would you like this sent to?  [id number] - [place], [place] - [address] AT [facility] [address] [place] [address] Phone: [phone] Fax: [phone]     Patient notified that their request is being sent to the clinical staff for review and that they should receive a response within 2 business days.   Please advise at Mobile [phone] (mobile)

## 2022-11-23 ENCOUNTER — Encounter: Payer: Self-pay | Admitting: Internal Medicine

## 2022-11-23 ENCOUNTER — Telehealth: Payer: Managed Care, Other (non HMO) | Admitting: Internal Medicine

## 2022-11-23 DIAGNOSIS — F5101 Primary insomnia: Secondary | ICD-10-CM

## 2022-11-23 DIAGNOSIS — N400 Enlarged prostate without lower urinary tract symptoms: Secondary | ICD-10-CM | POA: Insufficient documentation

## 2022-11-23 DIAGNOSIS — N401 Enlarged prostate with lower urinary tract symptoms: Secondary | ICD-10-CM | POA: Diagnosis not present

## 2022-11-23 DIAGNOSIS — R739 Hyperglycemia, unspecified: Secondary | ICD-10-CM

## 2022-11-23 DIAGNOSIS — I4892 Unspecified atrial flutter: Secondary | ICD-10-CM

## 2022-11-23 DIAGNOSIS — I4891 Unspecified atrial fibrillation: Secondary | ICD-10-CM

## 2022-11-23 DIAGNOSIS — Z0000 Encounter for general adult medical examination without abnormal findings: Secondary | ICD-10-CM

## 2022-11-23 MED ORDER — TADALAFIL 5 MG PO TABS: 5.0000 mg | ORAL_TABLET | Freq: Every day | ORAL | 11 refills | Status: DC

## 2022-11-23 NOTE — Assessment & Plan Note (Signed)
The patient lost weight on diet.  Obtain hemoglobin A1c

## 2022-11-23 NOTE — Assessment & Plan Note (Signed)
Eddie Dixon is working first shift now -sleeping well at night

## 2022-11-23 NOTE — Assessment & Plan Note (Signed)
Rate controlled Is on Eliquis

## 2022-11-23 NOTE — Assessment & Plan Note (Signed)
Continue with daily Cialis 5 mg

## 2022-11-23 NOTE — Progress Notes (Signed)
Virtual Visit via Video Note  I connected with Eddie Dixon on 11/23/22 at  8:30 AM EDT by a video enabled telemedicine application and verified that I am speaking with the correct person using two identifiers.   I discussed the limitations of evaluation and management by telemedicine and the availability of in person appointments. The patient expressed understanding and agreed to proceed.  I was located at our [facility] office. The patient was at home. There was no one else present in the visit.  Chief Complaint  Patient presents with   Medication Refill     History of Present Illness:   Review of Systems  Constitutional:  Positive for weight loss.  HENT:  Negative for congestion.   Respiratory:  Negative for cough.   Cardiovascular:  Negative for chest pain and palpitations.  Gastrointestinal:  Negative for constipation and diarrhea.  Neurological:  Negative for dizziness.  Psychiatric/Behavioral:  The patient is not nervous/anxious and does not have insomnia.    Follow-up on atrial fibrillation, BPH, insomnia.  Eddie Dixon is working first shift now -sleeping well at night.  He lost weight.  Feeling well.  Heart rate is in the 70s according to his Apple Watch.  Blood pressure is normal.  Observations/Objective: The patient appears to be in no acute distress  Assessment and Plan:  Problem List Items Addressed This Visit     Well adult exam - Primary   Relevant Orders   TSH   Urinalysis   CBC with Differential/Platelet   Lipid panel   PSA   Comprehensive metabolic panel   Hemoglobin A1c   Insomnia    Eddie Dixon is working first shift now -sleeping well at night      Atrial fibrillation and flutter (HCC)    Rate controlled Is on Eliquis      Relevant Medications   tadalafil (CIALIS) 5 MG tablet   BPH (benign prostatic hyperplasia)    Continue with daily Cialis 5 mg      Hyperglycemia    The patient lost weight on diet.  Obtain hemoglobin A1c       Relevant Orders   Hemoglobin A1c     Meds ordered this encounter  Medications   tadalafil (CIALIS) 5 MG tablet    Sig: Take 1 tablet (5 mg total) by mouth daily.    Dispense:  30 tablet    Refill:  11     Follow Up Instructions:    I discussed the assessment and treatment plan with the patient. The patient was provided an opportunity to ask questions and all were answered. The patient agreed with the plan and demonstrated an understanding of the instructions.   The patient was advised to call back or seek an in-person evaluation if the symptoms worsen or if the condition fails to improve as anticipated.  I provided face-to-face time during this encounter. We were at different locations.   Sonda Primes, MD

## 2023-01-22 ENCOUNTER — Ambulatory Visit (HOSPITAL_COMMUNITY)
Admission: EM | Admit: 2023-01-22 | Discharge: 2023-01-22 | Disposition: A | Discharge status: Home / Self Care | Payer: Managed Care, Other (non HMO) | Attending: Family Medicine | Admitting: Family Medicine

## 2023-01-22 ENCOUNTER — Encounter (HOSPITAL_COMMUNITY): Payer: Self-pay | Admitting: Emergency Medicine

## 2023-01-22 ENCOUNTER — Other Ambulatory Visit: Payer: Self-pay

## 2023-01-22 DIAGNOSIS — H6121 Impacted cerumen, right ear: Secondary | ICD-10-CM | POA: Diagnosis not present

## 2023-01-22 DIAGNOSIS — M792 Neuralgia and neuritis, unspecified: Secondary | ICD-10-CM

## 2023-01-22 NOTE — ED Provider Notes (Signed)
[facility]    CSN: [id number] Arrival date & time: 01/22/23  1324      History   Chief Complaint Chief Complaint  Patient presents with   Ear Fullness    HPI Eddie Dixon is a 57 y.o. male.    Ear Fullness  Here for trouble hearing out of his right ear and some ear fullness.  Is been going on for about a week.  No real pain but he does feel like it is affecting his equilibrium  No fever or cough  He does also note that for several months he has had burning pain of his knees bilaterally.  Then about a week ago it moved up into his thighs and was more intense and then it is back down toward his knees again.  He thinks maybe it would be a uric acid issue causing these burning symptoms.  No joint swelling and no trauma.  Past Medical History:  Diagnosis Date   A-fib [facility])    Anxiety    ED (erectile dysfunction)    Elevated blood pressure     Patient Active Problem List   Diagnosis Date Noted   BPH (benign prostatic hyperplasia) 11/23/2022   Hyperglycemia 11/23/2022   Hyperlipidemia 01/07/2022   Cramps of lower extremity 09/06/2021   History of cardioembolic cerebrovascular accident (CVA) 03/09/2021   Cerebral thrombosis with cerebral infarction 02/21/2021   New onset of congestive heart failure (HCC) 02/13/2021   Acute HFrEF (heart failure with reduced ejection fraction) (HCC) 02/13/2021   AKI (acute kidney injury) (HCC) 02/13/2021   Alcohol use 02/13/2021   Paresthesia 01/27/2021   Family history of early CAD 01/27/2021   Atrial fibrillation with RVR (HCC) 01/27/2021   Urinary tract infection 01/27/2021   Atrial fibrillation and flutter (HCC) 01/27/2021   Insomnia 11/16/2020   Exposure to STD 06/13/2017   Well adult exam 06/20/2012   Cerumen impaction 06/20/2012   ABNORMAL GLUCOSE NEC 02/01/2008   Anxiety state 04/05/2007   TOBACCO USE DISORDER/SMOKER-SMOKING CESSATION DISCUSSED 04/05/2007   ERECTILE DYSFUNCTION 12/01/2006    Past Surgical  History:  Procedure Laterality Date   BUBBLE STUDY  02/16/2021   Procedure: BUBBLE STUDY;  Surgeon: Thomasene Ripple, DO;  Location: [facility];  Service: Cardiovascular;;   RIGHT/LEFT HEART CATH AND CORONARY ANGIOGRAPHY N/A 02/19/2021   Procedure: RIGHT/LEFT HEART CATH AND CORONARY ANGIOGRAPHY;  Surgeon: Dolores Patty, MD;  Location: [facility];  Service: Cardiovascular;  Laterality: N/A;   TEE WITHOUT CARDIOVERSION N/A 02/16/2021   Procedure: TRANSESOPHAGEAL ECHOCARDIOGRAM (TEE);  Surgeon: Thomasene Ripple, DO;  Location: [facility];  Service: Cardiovascular;  Laterality: N/A;       Home Medications    Prior to Admission medications   Medication Sig Start Date End Date Taking? Authorizing Provider  amiodarone (PACERONE) 200 MG tablet Take 1 tablet (200 mg total) by mouth daily for 15 days. Then STOP 11/23/21 01/07/22  Milford, Anderson Malta, FNP  apixaban (ELIQUIS) 5 MG TABS tablet Take 1 tablet (5 mg total) by mouth 2 (two) times daily. 03/18/22   Runell Gess, MD  atorvastatin (LIPITOR) 40 MG tablet TAKE 1 TABLET (40 MG TOTAL) BY MOUTH DAILY. NEEDS FOLLOW UP APPOINTMENT FOR MORE REFILLS 08/05/22   Jacklynn Ganong, FNP  carvedilol (COREG) 6.25 MG tablet Take 1 tablet (6.25 mg total) by mouth 2 (two) times daily with a meal. 11/23/21   Milford, Anderson Malta, FNP  Cholecalciferol (VITAMIN D3) 50 MCG (2000 UT) capsule Take 1 capsule (2,000  Units total) by mouth daily. 01/27/21   Plotnikov, Georgina Quint, MD  co-enzyme Q-10 30 MG capsule Take 3 capsules (90 mg total) by mouth 2 (two) times daily. 11/24/21   Micki Riley, MD  dapagliflozin propanediol (FARXIGA) 10 MG TABS tablet Take 1 tablet (10 mg total) by mouth daily. 05/16/22   Bensimhon, Bevelyn Buckles, MD  losartan (COZAAR) 50 MG tablet Take 1 tablet (50 mg total) by mouth daily. 11/23/21   Milford, Anderson Malta, FNP  metoprolol succinate (TOPROL-XL) 25 MG 24 hr tablet Take 25 mg by mouth daily. 01/27/21   [provider]  Multiple  Vitamins-Minerals (MULTIVITAMIN ADULTS) TABS Take 1 tablet by mouth daily.    [provider]  spironolactone (ALDACTONE) 25 MG tablet Take 0.5 tablets (12.5 mg total) by mouth daily. NEEDS FOLLOW UP APPOINTMENT FOR MORE REFILLS 05/13/22   Bensimhon, Bevelyn Buckles, MD  tadalafil (CIALIS) 5 MG tablet Take 1 tablet (5 mg total) by mouth daily. 11/23/22   Plotnikov, Georgina Quint, MD  torsemide (DEMADEX) 20 MG tablet Take 1 tablet (20 mg total) by mouth daily as needed (for weight gain > 3 lb in 24 hrs or 5 lb over dry weight). 05/13/22   Bensimhon, Bevelyn Buckles, MD    Family History Family History  Problem Relation Age of Onset   Heart disease Father 19       MI    Social History Social History   Tobacco Use   Smoking status: Never   Smokeless tobacco: Current    Types: Chew  Vaping Use   Vaping status: Never Used  Substance Use Topics   Alcohol use: Yes    Alcohol/week: 17.0 standard drinks of alcohol    Types: 17 Shots of liquor per week    Comment: drinks fifth of liquor per week x 1 year.   Drug use: No     Allergies   Patient has no known allergies.   Review of Systems Review of Systems   Physical Exam Triage Vital Signs ED Triage Vitals  Encounter Vitals Group     BP 01/22/23 1445 124/81     Systolic BP Percentile --      Diastolic BP Percentile --      Pulse Rate 01/22/23 1445 61     Resp 01/22/23 1445 20     Temp 01/22/23 1445 97.8 F (36.6 C)     Temp Source 01/22/23 1445 Oral     SpO2 01/22/23 1445 98 %     Weight --      Height --      Head Circumference --      Peak Flow --      Pain Score 01/22/23 1442 9     Pain Loc --      Pain Education --      Exclude from Growth Chart --    No data found.  Updated Vital Signs BP 124/81 (BP Location: Right Arm) Comment (BP Location): large cuff  Pulse 61   Temp 97.8 F (36.6 C) (Oral)   Resp 20   SpO2 98%   Visual Acuity Right Eye Distance:   Left Eye Distance:   Bilateral Distance:    Right Eye  Near:   Left Eye Near:    Bilateral Near:     Physical Exam Vitals reviewed.  Constitutional:      General: He is not in acute distress.    Appearance: He is not toxic-appearing.  HENT:     Ears:  Comments: Right tympanic membrane is obscured by dark brown cerumen.  There is a small amount of cerumen in the left ear canal, but is nonobstructive and the tympanic membrane is normal there.    Nose: Nose normal.     Mouth/Throat:     Mouth: Mucous membranes are moist.  Eyes:     Extraocular Movements: Extraocular movements intact.     Pupils: Pupils are equal, round, and reactive to light.  Skin:    Coloration: Skin is not pale.  Neurological:     Mental Status: He is alert and oriented to person, place, and time.  Psychiatric:        Behavior: Behavior normal.      UC Treatments / Results  Labs (all labs ordered are listed, but only abnormal results are displayed) Labs Reviewed - No data to display  EKG   Radiology No results found.  Procedures Procedures (including critical care time)  Medications Ordered in UC Medications - No data to display  Initial Impression / Assessment and Plan / UC Course  I have reviewed the triage vital signs and the nursing notes.  Pertinent labs & imaging results that were available during my care of the patient were reviewed by me and considered in my medical decision making (see chart for details).     I discussed with the patient that I do not think his burning pains in his legs are anything typical of gout.  He has not had any trauma so I did not do any evaluation of the symptoms today.  He will be seeing his primary care later this week and he can discuss this further with them.   After ear lavage his right ear is clear and he can hear better.  We discussed how to clean his ears without Q-tips.   Final Clinical Impressions(s) / UC Diagnoses   Final diagnoses:  Impacted cerumen of right ear  Neuropathic pain      Discharge Instructions      Lavage to right ear and obtained some cerumen.  I am glad you are going to get to see your primary care this week.  Please discuss these more chronic symptoms with them.       ED Prescriptions   None    PDMP not reviewed this encounter.   Zenia Resides, MD 01/22/23 [phone]

## 2023-01-22 NOTE — Discharge Instructions (Signed)
Lavage to right ear and obtained some cerumen.  I am glad you are going to get to see your primary care this week.  Please discuss these more chronic symptoms with them.

## 2023-01-22 NOTE — ED Triage Notes (Signed)
For 1 to 1 1/2 weeks has had right ear issues.  Used q-tip and water to clean ear. But hearing greatly decreased.  Says equilibrium is off.    Burning pain in bilateral knees for 4-5 months.  Now burning pain has moved int thighs.   Patient does have a pcp.

## 2023-01-23 ENCOUNTER — Other Ambulatory Visit (INDEPENDENT_AMBULATORY_CARE_PROVIDER_SITE_OTHER): Payer: Managed Care, Other (non HMO)

## 2023-01-23 DIAGNOSIS — Z0000 Encounter for general adult medical examination without abnormal findings: Secondary | ICD-10-CM | POA: Diagnosis not present

## 2023-01-23 DIAGNOSIS — R739 Hyperglycemia, unspecified: Secondary | ICD-10-CM | POA: Diagnosis not present

## 2023-01-23 LAB — HEMOGLOBIN A1C: Hgb A1c MFr Bld: 5.7 % (ref 4.6–6.5)

## 2023-01-23 LAB — LIPID PANEL
Cholesterol: 211 mg/dL — ABNORMAL HIGH (ref 0–200)
HDL: 85.5 mg/dL (ref >39.00)
LDL Cholesterol: 96 mg/dL (ref 0–99)
NonHDL: 125.09
Total CHOL/HDL Ratio: 2
Triglycerides: 146 mg/dL (ref 0.0–149.0)
VLDL: 29.2 mg/dL (ref 0.0–40.0)

## 2023-01-23 LAB — CBC WITH DIFFERENTIAL/PLATELET
Basophils Absolute: 0 10*3/uL (ref 0.0–0.1)
Basophils Relative: 0.7 % (ref 0.0–3.0)
Eosinophils Absolute: 0.3 10*3/uL (ref 0.0–0.7)
Eosinophils Relative: 6 % — ABNORMAL HIGH (ref 0.0–5.0)
HCT: 44.4 % (ref 39.0–52.0)
Hemoglobin: 15 g/dL (ref 13.0–17.0)
Lymphocytes Relative: 21.3 % (ref 12.0–46.0)
Lymphs Abs: 1.1 10*3/uL (ref 0.7–4.0)
MCHC: 33.8 g/dL (ref 30.0–36.0)
MCV: 94.6 fL (ref 78.0–100.0)
Monocytes Absolute: 0.7 10*3/uL (ref 0.1–1.0)
Monocytes Relative: 13.9 % — ABNORMAL HIGH (ref 3.0–12.0)
Neutro Abs: 3 10*3/uL (ref 1.4–7.7)
Neutrophils Relative %: 58.1 % (ref 43.0–77.0)
Platelets: 257 10*3/uL (ref 150.0–400.0)
RBC: 4.69 Mil/uL (ref 4.22–5.81)
RDW: 12 % (ref 11.5–15.5)
WBC: 5.1 10*3/uL (ref 4.0–10.5)

## 2023-01-23 LAB — URINALYSIS
Bilirubin Urine: NEGATIVE
Hgb urine dipstick: NEGATIVE
Ketones, ur: NEGATIVE
Leukocytes,Ua: NEGATIVE
Nitrite: NEGATIVE
Specific Gravity, Urine: >=1.03 — AB (ref 1.000–1.030)
Total Protein, Urine: NEGATIVE
Urine Glucose: NEGATIVE
Urobilinogen, UA: 0.2 (ref 0.0–1.0)
pH: 6 (ref 5.0–8.0)

## 2023-01-23 LAB — COMPREHENSIVE METABOLIC PANEL
ALT: 19 U/L (ref 0–53)
AST: 20 U/L (ref 0–37)
Albumin: 4.5 g/dL (ref 3.5–5.2)
Alkaline Phosphatase: 50 U/L (ref 39–117)
BUN: 22 mg/dL (ref 6–23)
CO2: 28 meq/L (ref 19–32)
Calcium: 9.6 mg/dL (ref 8.4–10.5)
Chloride: 103 meq/L (ref 96–112)
Creatinine, Ser: 1.21 mg/dL (ref 0.40–1.50)
GFR: 67.63 mL/min (ref >60.00)
Glucose, Bld: 89 mg/dL (ref 70–99)
Potassium: 4.7 meq/L (ref 3.5–5.1)
Sodium: 140 meq/L (ref 135–145)
Total Bilirubin: 0.6 mg/dL (ref 0.2–1.2)
Total Protein: 7 g/dL (ref 6.0–8.3)

## 2023-01-24 LAB — TSH: TSH: 0.97 u[IU]/mL (ref 0.35–5.50)

## 2023-01-24 LAB — PSA: PSA: 0.45 ng/mL (ref 0.10–4.00)

## 2023-01-26 ENCOUNTER — Encounter: Payer: Managed Care, Other (non HMO) | Admitting: Internal Medicine

## 2023-02-07 ENCOUNTER — Encounter: Payer: Self-pay | Admitting: Internal Medicine

## 2023-02-07 ENCOUNTER — Ambulatory Visit (INDEPENDENT_AMBULATORY_CARE_PROVIDER_SITE_OTHER): Payer: Managed Care, Other (non HMO) | Admitting: Internal Medicine

## 2023-02-07 VITALS — BP 120/70 | HR 70 | Temp 98.6°F | Ht 69.0 in | Wt 232.0 lb

## 2023-02-07 DIAGNOSIS — E782 Mixed hyperlipidemia: Secondary | ICD-10-CM

## 2023-02-07 DIAGNOSIS — T7840XA Allergy, unspecified, initial encounter: Secondary | ICD-10-CM | POA: Diagnosis not present

## 2023-02-07 DIAGNOSIS — I4891 Unspecified atrial fibrillation: Secondary | ICD-10-CM

## 2023-02-07 DIAGNOSIS — Z0000 Encounter for general adult medical examination without abnormal findings: Secondary | ICD-10-CM

## 2023-02-07 DIAGNOSIS — Z1211 Encounter for screening for malignant neoplasm of colon: Secondary | ICD-10-CM

## 2023-02-07 NOTE — Assessment & Plan Note (Signed)
 Pt had a reaction to bourbon a couple of months ago: He had a small drink of bourbon and developed severe face flushing and pain in both legs with stiffness (thighs, shins) that lasted for several days. Avoid Bourbon Keep Benadryl on hand to use as needed

## 2023-02-07 NOTE — Assessment & Plan Note (Signed)
 On atorvastatin

## 2023-02-07 NOTE — Assessment & Plan Note (Signed)
On Eliquis, Farxiga 

## 2023-02-07 NOTE — Progress Notes (Signed)
 Subjective:  Patient ID: Eddie Dixon, male    DOB: 03/02/1965  Age: 58 y.o. MRN: [id number]  CC: Annual Exam   HPI Eddie Dixon presents for a well exam A well exam Pt had a reaction to bourbon a couple of months ago: He had a small drink of bourbon and developed severe face flushing and pain in both legs with stiffness (thighs, shins) that lasted for several days.  He has not drank any alcohol since then  Outpatient Medications Prior to Visit  Medication Sig Dispense Refill   apixaban  (ELIQUIS ) 5 MG TABS tablet Take 1 tablet (5 mg total) by mouth 2 (two) times daily. 180 tablet 0   atorvastatin  (LIPITOR) 40 MG tablet TAKE 1 TABLET (40 MG TOTAL) BY MOUTH DAILY. NEEDS FOLLOW UP APPOINTMENT FOR MORE REFILLS 90 tablet 0   carvedilol  (COREG ) 6.25 MG tablet Take 1 tablet (6.25 mg total) by mouth 2 (two) times daily with a meal. 60 tablet 3   Cholecalciferol (VITAMIN D3) 50 MCG (2000 UT) capsule Take 1 capsule (2,000 Units total) by mouth daily. 100 capsule 3   co-enzyme Q-10 30 MG capsule Take 3 capsules (90 mg total) by mouth 2 (two) times daily. 1 capsule 1   dapagliflozin  propanediol (FARXIGA ) 10 MG TABS tablet Take 1 tablet (10 mg total) by mouth daily. 90 tablet 3   losartan  (COZAAR ) 50 MG tablet Take 1 tablet (50 mg total) by mouth daily. 90 tablet 3   metoprolol  succinate (TOPROL -XL) 25 MG 24 hr tablet Take 25 mg by mouth daily.     Multiple Vitamins-Minerals (MULTIVITAMIN ADULTS) TABS Take 1 tablet by mouth daily.     spironolactone  (ALDACTONE ) 25 MG tablet Take 0.5 tablets (12.5 mg total) by mouth daily. NEEDS FOLLOW UP APPOINTMENT FOR MORE REFILLS 45 tablet 0   tadalafil  (CIALIS ) 5 MG tablet Take 1 tablet (5 mg total) by mouth daily. 30 tablet 11   torsemide  (DEMADEX ) 20 MG tablet Take 1 tablet (20 mg total) by mouth daily as needed (for weight gain > 3 lb in 24 hrs or 5 lb over dry weight). 30 tablet 3   amiodarone  (PACERONE ) 200 MG tablet Take 1 tablet (200 mg total) by mouth  daily for 15 days. Then STOP 15 tablet 0   No facility-administered medications prior to visit.    ROS: Review of Systems  Constitutional:  Negative for appetite change, fatigue and unexpected weight change.  HENT:  Negative for congestion, nosebleeds, sneezing, sore throat and trouble swallowing.   Eyes:  Negative for itching and visual disturbance.  Respiratory:  Negative for cough.   Cardiovascular:  Negative for chest pain, palpitations and leg swelling.  Gastrointestinal:  Negative for abdominal distention, blood in stool, diarrhea and nausea.  Genitourinary:  Negative for frequency and hematuria.  Musculoskeletal:  Negative for back pain, gait problem, joint swelling and neck pain.  Skin:  Negative for rash.  Neurological:  Negative for dizziness, tremors, speech difficulty and weakness.  Psychiatric/Behavioral:  Negative for agitation, dysphoric mood and sleep disturbance. The patient is not nervous/anxious.     Objective:  BP 120/70 (BP Location: Left Arm, Patient Position: Sitting, Cuff Size: Normal)   Pulse 70   Temp 98.6 F (37 C) (Oral)   Ht 5' 9 (1.753 m)   Wt 232 lb (105.2 kg)   SpO2 96%   BMI 34.26 kg/m   BP Readings from Last 3 Encounters:  02/07/23 120/70  01/22/23 124/81  05/25/22 135/82    Wt  Readings from Last 3 Encounters:  02/07/23 232 lb (105.2 kg)  05/25/22 238 lb 3.2 oz (108 kg)  01/07/22 240 lb (108.9 kg)    Physical Exam Constitutional:      General: He is not in acute distress.    Appearance: He is well-developed.     Comments: NAD  Eyes:     Conjunctiva/sclera: Conjunctivae normal.     Pupils: Pupils are equal, round, and reactive to light.  Neck:     Thyroid : No thyromegaly.     Vascular: No JVD.  Cardiovascular:     Rate and Rhythm: Normal rate and regular rhythm.     Heart sounds: Normal heart sounds. No murmur heard.    No friction rub. No gallop.  Pulmonary:     Effort: Pulmonary effort is normal. No respiratory distress.      Breath sounds: Normal breath sounds. No wheezing or rales.  Chest:     Chest wall: No tenderness.  Abdominal:     General: Bowel sounds are normal. There is no distension.     Palpations: Abdomen is soft. There is no mass.     Tenderness: There is no abdominal tenderness. There is no guarding or rebound.  Musculoskeletal:        General: No tenderness. Normal range of motion.     Cervical back: Normal range of motion.  Lymphadenopathy:     Cervical: No cervical adenopathy.  Skin:    General: Skin is warm and dry.     Findings: No rash.  Neurological:     Mental Status: He is alert and oriented to person, place, and time.     Cranial Nerves: No cranial nerve deficit.     Motor: No abnormal muscle tone.     Coordination: Coordination normal.     Gait: Gait normal.     Deep Tendon Reflexes: Reflexes are normal and symmetric.  Psychiatric:        Behavior: Behavior normal.        Thought Content: Thought content normal.        Judgment: Judgment normal.     Lab Results  Component Value Date   WBC 5.1 01/23/2023   HGB 15.0 01/23/2023   HCT 44.4 01/23/2023   PLT 257.0 01/23/2023   GLUCOSE 89 01/23/2023   CHOL 211 (H) 01/23/2023   TRIG 146.0 01/23/2023   HDL 85.50 01/23/2023   LDLDIRECT 146.3 01/24/2008   LDLCALC 96 01/23/2023   ALT 19 01/23/2023   AST 20 01/23/2023   NA 140 01/23/2023   K 4.7 01/23/2023   CL 103 01/23/2023   CREATININE 1.21 01/23/2023   BUN 22 01/23/2023   CO2 28 01/23/2023   TSH 0.97 01/23/2023   PSA 0.45 01/23/2023   INR 1.1 02/13/2021   HGBA1C 5.7 01/23/2023    No results found.  Assessment & Plan:   Problem List Items Addressed This Visit     Well adult exam - Primary    We discussed age appropriate health related issues, including available/recomended screening tests and vaccinations. Labs were ordered to be later reviewed . All questions were answered. We discussed one or more of the following - seat belt use, use of sunscreen/sun  exposure exercise, fall risk reduction, second hand smoke exposure, firearm use and storage, seat belt use, a need for adhering to healthy diet and exercise. Labs were ordered.  All questions were answered. Cologuard test was ordered      Atrial fibrillation with RVR (HCC)  On Eliquis , Farxiga       Hyperlipidemia   On atorvastatin       Allergic reaction   Pt had a reaction to bourbon a couple of months ago: He had a small drink of bourbon and developed severe face flushing and pain in both legs with stiffness (thighs, shins) that lasted for several days. Avoid Bourbon Keep Benadryl on hand to use as needed      Other Visit Diagnoses       Screening for colon cancer       Relevant Orders   Cologuard         No orders of the defined types were placed in this encounter.     Follow-up: Return in about 1 year (around 02/07/2024) for Wellness Exam.  Marolyn Noel, MD

## 2023-02-07 NOTE — Assessment & Plan Note (Signed)

## 2023-03-03 ENCOUNTER — Ambulatory Visit: Payer: Self-pay | Admitting: Internal Medicine

## 2023-03-03 ENCOUNTER — Encounter (HOSPITAL_BASED_OUTPATIENT_CLINIC_OR_DEPARTMENT_OTHER): Payer: Self-pay

## 2023-03-03 ENCOUNTER — Other Ambulatory Visit: Payer: Self-pay

## 2023-03-03 ENCOUNTER — Emergency Department (HOSPITAL_BASED_OUTPATIENT_CLINIC_OR_DEPARTMENT_OTHER)
Admission: EM | Admit: 2023-03-03 | Discharge: 2023-03-03 | Disposition: A | Discharge status: Home / Self Care | Payer: Managed Care, Other (non HMO) | Attending: Emergency Medicine | Admitting: Emergency Medicine

## 2023-03-03 DIAGNOSIS — H6501 Acute serous otitis media, right ear: Secondary | ICD-10-CM | POA: Diagnosis not present

## 2023-03-03 DIAGNOSIS — H6591 Unspecified nonsuppurative otitis media, right ear: Secondary | ICD-10-CM

## 2023-03-03 DIAGNOSIS — Z79899 Other long term (current) drug therapy: Secondary | ICD-10-CM | POA: Diagnosis not present

## 2023-03-03 DIAGNOSIS — R001 Bradycardia, unspecified: Secondary | ICD-10-CM | POA: Insufficient documentation

## 2023-03-03 DIAGNOSIS — J101 Influenza due to other identified influenza virus with other respiratory manifestations: Secondary | ICD-10-CM | POA: Diagnosis not present

## 2023-03-03 DIAGNOSIS — Z7901 Long term (current) use of anticoagulants: Secondary | ICD-10-CM | POA: Insufficient documentation

## 2023-03-03 DIAGNOSIS — Z20822 Contact with and (suspected) exposure to covid-19: Secondary | ICD-10-CM | POA: Insufficient documentation

## 2023-03-03 DIAGNOSIS — R059 Cough, unspecified: Secondary | ICD-10-CM | POA: Diagnosis present

## 2023-03-03 LAB — RESP PANEL BY RT-PCR (RSV, FLU A&B, COVID)  RVPGX2
Influenza A by PCR: POSITIVE — AB
Influenza B by PCR: NEGATIVE
Resp Syncytial Virus by PCR: NEGATIVE
SARS Coronavirus 2 by RT PCR: NEGATIVE

## 2023-03-03 MED ORDER — ACETAMINOPHEN 500 MG PO TABS
1000.0000 mg | ORAL_TABLET | Freq: Once | ORAL | Status: AC
  Administered 2023-03-03: 1000 mg via ORAL
  Filled 2023-03-03: qty 2

## 2023-03-03 MED ORDER — IBUPROFEN 400 MG PO TABS
400.0000 mg | ORAL_TABLET | Freq: Once | ORAL | Status: AC
  Administered 2023-03-03: 400 mg via ORAL
  Filled 2023-03-03: qty 1

## 2023-03-03 NOTE — Discharge Instructions (Addendum)
 It was our pleasure to provide your ER care today - we hope that you feel better.  Drink plenty of fluids/stay well hydrated.  For ear fluid/fullness sensation, you may try taking an anti-histamine, decongestant such as zyrtec-d or claritin-d as need (these meds are available over the counter).   Follow up with primary care doctor in one - two weeks if symptoms fail to improve/resolve. Your heart rate is slow and has been noted to be slow previously (possible due to your medications, metoprolol , etc.) - follow up with your doctor/cardiologist.   Return to ER if worse, new symptoms, increased trouble breathing, severe ear pain, severe headache, chest pain, weak/fainting, or other emergency concern.

## 2023-03-03 NOTE — ED Triage Notes (Signed)
 Pt POV from home c/o dizziness, right ear pain, cough, fever x 1 week. Pt denies ShOB, CP, NVD. NAD during triage

## 2023-03-03 NOTE — ED Provider Notes (Signed)
 [place] EMERGENCY DEPARTMENT AT [facility] Provider Note   CSN: [id number] Arrival date & time: 03/03/23  0708     History  Chief Complaint  Patient presents with   Cough   right ear fullness    Eddie Dixon is a 58 y.o. male.  Pt c/o non prod cough, right ear fullness, in past few days. No specific known ill contacts. No headache. No sinus pain or purulent drainage. No sore throat or trouble swallowing. No ear pain, tinnitus or hearing loss. No drainage from ear. No ear trauma. Hx cerumen impaction. Denies chest pain or sob. No abd pain or nvd. No extremity pain or swelling. Subjective fever.   The history is provided by the patient and medical records.  Dizziness Associated symptoms: no chest pain, no diarrhea, no headaches, no shortness of breath, no tinnitus, no vomiting and no weakness        Home Medications Prior to Admission medications   Medication Sig Start Date End Date Taking? Authorizing Provider  amiodarone  (PACERONE ) 200 MG tablet Take 1 tablet (200 mg total) by mouth daily for 15 days. Then STOP 11/23/21 01/07/22  Milford, Harlene HERO, FNP  apixaban  (ELIQUIS ) 5 MG TABS tablet Take 1 tablet (5 mg total) by mouth 2 (two) times daily. 03/18/22   Court Dorn PARAS, MD  atorvastatin  (LIPITOR) 40 MG tablet TAKE 1 TABLET (40 MG TOTAL) BY MOUTH DAILY. NEEDS FOLLOW UP APPOINTMENT FOR MORE REFILLS 08/05/22   Glena Harlene HERO, FNP  carvedilol  (COREG ) 6.25 MG tablet Take 1 tablet (6.25 mg total) by mouth 2 (two) times daily with a meal. 11/23/21   Milford, Harlene HERO, FNP  Cholecalciferol (VITAMIN D3) 50 MCG (2000 UT) capsule Take 1 capsule (2,000 Units total) by mouth daily. 01/27/21   Plotnikov, Aleksei V, MD  co-enzyme Q-10 30 MG capsule Take 3 capsules (90 mg total) by mouth 2 (two) times daily. 11/24/21   Rosemarie Eather RAMAN, MD  dapagliflozin  propanediol (FARXIGA ) 10 MG TABS tablet Take 1 tablet (10 mg total) by mouth daily. 05/16/22   Bensimhon, Toribio SAUNDERS, MD   losartan  (COZAAR ) 50 MG tablet Take 1 tablet (50 mg total) by mouth daily. 11/23/21   Milford, Harlene HERO, FNP  metoprolol  succinate (TOPROL -XL) 25 MG 24 hr tablet Take 25 mg by mouth daily. 01/27/21   [provider]  Multiple Vitamins-Minerals (MULTIVITAMIN ADULTS) TABS Take 1 tablet by mouth daily.    [provider]  spironolactone  (ALDACTONE ) 25 MG tablet Take 0.5 tablets (12.5 mg total) by mouth daily. NEEDS FOLLOW UP APPOINTMENT FOR MORE REFILLS 05/13/22   Bensimhon, Toribio SAUNDERS, MD  tadalafil  (CIALIS ) 5 MG tablet Take 1 tablet (5 mg total) by mouth daily. 11/23/22   Plotnikov, Aleksei V, MD  torsemide  (DEMADEX ) 20 MG tablet Take 1 tablet (20 mg total) by mouth daily as needed (for weight gain > 3 lb in 24 hrs or 5 lb over dry weight). 05/13/22   Bensimhon, Toribio SAUNDERS, MD      Allergies    Bourbon [alcohol]    Review of Systems   Review of Systems  Constitutional:  Negative for chills and diaphoresis.  HENT:  Negative for sinus pain, sore throat, tinnitus and trouble swallowing.   Eyes:  Negative for pain, discharge and redness.  Respiratory:  Positive for cough. Negative for shortness of breath.   Cardiovascular:  Negative for chest pain.  Gastrointestinal:  Negative for abdominal pain, diarrhea and vomiting.  Genitourinary:  Negative for flank pain.  Musculoskeletal:  Negative for back pain, neck pain and neck stiffness.  Skin:  Negative for rash.  Neurological:  Negative for dizziness, weakness, numbness and headaches.    Physical Exam Updated Vital Signs BP (!) 140/84 (BP Location: Left Arm)   Pulse (!) 43   Temp (!) 97.5 F (36.4 C) (Oral)   Ht 1.753 m (5' 9)   Wt 99.8 kg   SpO2 100%   BMI 32.49 kg/m  Physical Exam Vitals and nursing note reviewed.  Constitutional:      Appearance: Normal appearance. He is well-developed.  HENT:     Head: Atraumatic.     Comments: No sinus or temporal tenderness.     Ears:     Comments: Bilateral eacs partially  occluded with cerumen. No acute om or acute otitis externa.  Some clear fluid behind tms.     Nose: Nose normal.     Mouth/Throat:     Mouth: Mucous membranes are moist.     Pharynx: Oropharynx is clear. No oropharyngeal exudate or posterior oropharyngeal erythema.  Eyes:     General: No scleral icterus.    Conjunctiva/sclera: Conjunctivae normal.     Pupils: Pupils are equal, round, and reactive to light.  Neck:     Trachea: No tracheal deviation.     Comments: No stiffness or rigidity Cardiovascular:     Rate and Rhythm: Regular rhythm. Bradycardia present.     Pulses: Normal pulses.     Heart sounds: Normal heart sounds. No murmur heard.    No friction rub. No gallop.  Pulmonary:     Effort: Pulmonary effort is normal. No accessory muscle usage or respiratory distress.     Breath sounds: Normal breath sounds.  Abdominal:     General: Bowel sounds are normal. There is no distension.     Palpations: Abdomen is soft. There is no mass.     Tenderness: There is no abdominal tenderness. There is no guarding.  Genitourinary:    Comments: No cva tenderness. Musculoskeletal:        General: No swelling or tenderness.     Cervical back: Normal range of motion and neck supple. No rigidity.     Right lower leg: No edema.     Left lower leg: No edema.  Lymphadenopathy:     Cervical: No cervical adenopathy.  Skin:    General: Skin is warm and dry.     Findings: No rash.  Neurological:     Mental Status: He is alert.     Comments: Alert, speech clear. Motor/sens grossly intact bil. Steady gait, no ataxia.   Psychiatric:        Mood and Affect: Mood normal.     ED Results / Procedures / Treatments   Labs (all labs ordered are listed, but only abnormal results are displayed) Results for orders placed or performed during the hospital encounter of 03/03/23  Resp panel by RT-PCR (RSV, Flu A&B, Covid) Anterior Nasal Swab   Collection Time: 03/03/23  8:04 AM   Specimen: Anterior Nasal  Swab  Result Value Ref Range   SARS Coronavirus 2 by RT PCR NEGATIVE NEGATIVE   Influenza A by PCR POSITIVE (A) NEGATIVE   Influenza B by PCR NEGATIVE NEGATIVE   Resp Syncytial Virus by PCR NEGATIVE NEGATIVE    EKG EKG Interpretation Date/Time:  Friday March 03 2023 08:02:34 EST Ventricular Rate:  44 PR Interval:  163 QRS Duration:  83 QT Interval:  454 QTC Calculation: 389 R  Axis:   69  Text Interpretation: Sinus bradycardia Confirmed by Bernard Drivers ([id number]) on 03/03/2023 8:46:53 AM  Radiology No results found.  Procedures Procedures    Medications Ordered in ED Medications - No data to display  ED Course/ Medical Decision Making/ A&P                                 Medical Decision Making Problems Addressed: Influenza A: acute illness or injury with systemic symptoms that poses a threat to life or bodily functions Right serous otitis media, unspecified chronicity: acute illness or injury Sinus bradycardia: chronic illness or injury  Amount and/or Complexity of Data Reviewed External Data Reviewed: notes. Labs: ordered. Decision-making details documented in ED Course. ECG/medicine tests: ordered and independent interpretation performed. Decision-making details documented in ED Course.  Risk OTC drugs. Prescription drug management.   Labs sent.   Reviewed nursing notes and prior charts for additional history.   Hx/exam felt most c/w uri, serous om.   Labs reviewed/interpreted by me - flu positive.   Acetaminophen  po, ibuprofen  po.   Pt with sinus bradycardia, hx same noted on prior ecgs. No faintness or dizziness. No hx syncope. No chest pain. Rec pcp/card f/u.   Pt appears stable for ED d/c.   Rec pcp f/u.  Return precautions provided.         Final Clinical Impression(s) / ED Diagnoses Final diagnoses:  None    Rx / DC Orders ED Discharge Orders     None         Bernard Drivers, MD 03/03/23 [phone]

## 2023-03-03 NOTE — Telephone Encounter (Signed)
 Patient seen in ED this am for influenza A Chief Complaint: dizziness, right ear whooshing sounds s/p influenza A Symptoms: dizziness, room spinning and lightheaded at times. Can walk.   Driving at times has episodes of dizziness.  Frequency: 1 month  Pertinent Negatives: Patient denies chest pain no difficulty breathing no headaches, no blurred vision no weakness on either side of body. No vomiting  Disposition: [] ED /[] Urgent Care (no appt availability in office) / [x] Appointment(In office/virtual)/ []  [facility]/ [] Home Care/ [] Refused Recommended Disposition /[] [facility]/ []  Follow-up with PCP Additional Notes:   Patient seen in ED this am and dx with influenza A. Dizziness has been issues and patient requesting if any OTC medications will help with dizziness  until seen Monday with provider.  Please advise , can send message or recommendations via My Chart.  Recommended if sx worsen go back to ED. Increase water intake and allow extra time to change positions and stand. Recommended not to drive if dizzy until evaluated by provider.       Copied from CRM [id number]. Topic: Clinical - Red Word Triage >> Mar 03, 2023 11:23 AM Evie B wrote: [facility] that prompted transfer to Nurse Triage: vertigo, fluid in rt ear Reason for Disposition  [1] MODERATE dizziness (e.g., vertigo; feels very unsteady, interferes with normal activities) AND [2] has been evaluated by doctor (or NP/PA) for this  Answer Assessment - Initial Assessment Questions 1. DESCRIPTION: Describe your dizziness.     Lightheaded and room spinning  2. VERTIGO: Do you feel like either you or the room is spinning or tilting?      Room spinning  3. LIGHTHEADED: Do you feel lightheaded? (e.g., somewhat faint, woozy, weak upon standing)     Room spinning this am  4. SEVERITY: How bad is it?  Can you walk?   - MILD: Feels slightly dizzy and unsteady, but is walking normally.   - MODERATE: Feels  unsteady when walking, but not falling; interferes with normal activities (e.g., school, work).   - SEVERE: Unable to walk without falling, or requires assistance to walk without falling.     Can walk dizziness comes and goes  5. ONSET:  When did the dizziness begin?     Worsening this am upon awakening  6. AGGRAVATING FACTORS: Does anything make it worse? (e.g., standing, change in head position)     Change in head position 7. CAUSE: What do you think is causing the dizziness?     Fluid in right ear 8. RECURRENT SYMPTOM: Have you had dizziness before? If Yes, ask: When was the last time? What happened that time?     Yes  9. OTHER SYMPTOMS: Do you have any other symptoms? (e.g., headache, weakness, numbness, vomiting, earache)     Dizziness, room spinning . Right ear issues fluid in ear constant whooshing sound  10. PREGNANCY: Is there any chance you are pregnant? When was your last menstrual period?       na  Protocols used: Dizziness - Vertigo-A-AH

## 2023-03-05 NOTE — Telephone Encounter (Signed)
 Please use over-the-counter Antivert  as needed.  Hydrate well.  Thanks

## 2023-03-06 ENCOUNTER — Telehealth: Payer: Self-pay | Admitting: Internal Medicine

## 2023-03-06 ENCOUNTER — Ambulatory Visit (INDEPENDENT_AMBULATORY_CARE_PROVIDER_SITE_OTHER): Payer: Managed Care, Other (non HMO) | Admitting: Emergency Medicine

## 2023-03-06 ENCOUNTER — Encounter: Payer: Self-pay | Admitting: Emergency Medicine

## 2023-03-06 VITALS — BP 130/86 | HR 86 | Ht 69.0 in | Wt 230.0 lb

## 2023-03-06 DIAGNOSIS — R42 Dizziness and giddiness: Secondary | ICD-10-CM

## 2023-03-06 DIAGNOSIS — R2689 Other abnormalities of gait and mobility: Secondary | ICD-10-CM | POA: Insufficient documentation

## 2023-03-06 DIAGNOSIS — H9191 Unspecified hearing loss, right ear: Secondary | ICD-10-CM | POA: Diagnosis not present

## 2023-03-06 MED ORDER — MECLIZINE HCL 25 MG PO TABS: 25.0000 mg | ORAL_TABLET | Freq: Three times a day (TID) | ORAL | 0 refills | Status: DC | PRN

## 2023-03-06 NOTE — Assessment & Plan Note (Signed)
 Most likely secondary to inner ear problem. Labyrinthitis suspected May benefit from meclizine  25 mg every 6-8 hours as needed Recommend ENT evaluation.  Referral placed today.

## 2023-03-06 NOTE — Telephone Encounter (Signed)
 LDVM for pt of the following per the provider "Please use over-the-counter Antivert  as needed.  Hydrate well.'  I also LVM informing pt referral is being worked on for ENT.

## 2023-03-06 NOTE — Telephone Encounter (Signed)
 Copied from CRM [id number]. Topic: Referral - Status >> Mar 03, 2023  4:46 PM Corin V wrote: Reason for CRM: Patient called in for an update on his referral to an ear specialist due to his vertigo.

## 2023-03-06 NOTE — Progress Notes (Signed)
 Eddie Dixon Mar 58 y.o.   Chief Complaint  Patient presents with   Dizziness    Patient states having the dizziness and trouble hearing out of the right ear for about a month. Did go to urgent care, they flushed out his right ear. States his balance is also thrown off with the hearing issue. No pain to the left ear.    HISTORY OF PRESENT ILLNESS: Acute problem visit today.  Patient of Dr. Fabio Holts Plotnikov. This is a 58 y.o. male complaining of intermittent symptoms for the past 2 months.  Complaining of occasional dizziness and trouble hearing out of the right ear Went to urgent care center.  They flushed his ear.  Symptoms still the same spot.  Sometimes balance is off No other associated symptoms No other complaints or medical concerns today.  Dizziness Pertinent negatives include no abdominal pain, chest pain, chills, congestion, coughing, fever, headaches, nausea, sore throat or vomiting.     Prior to Admission medications   Medication Sig Start Date End Date Taking? Authorizing Provider  apixaban  (ELIQUIS ) 5 MG TABS tablet Take 1 tablet (5 mg total) by mouth 2 (two) times daily. 03/18/22  Yes Avanell Leigh, MD  atorvastatin  (LIPITOR) 40 MG tablet TAKE 1 TABLET (40 MG TOTAL) BY MOUTH DAILY. NEEDS FOLLOW UP APPOINTMENT FOR MORE REFILLS 08/05/22  Yes Lafferty, Arlice Bene, FNP  carvedilol  (COREG ) 6.25 MG tablet Take 1 tablet (6.25 mg total) by mouth 2 (two) times daily with a meal. 11/23/21  Yes Milford, Arlice Bene, FNP  Cholecalciferol (VITAMIN D3) 50 MCG (2000 UT) capsule Take 1 capsule (2,000 Units total) by mouth daily. 01/27/21  Yes Plotnikov, Aleksei V, MD  co-enzyme Q-10 30 MG capsule Take 3 capsules (90 mg total) by mouth 2 (two) times daily. 11/24/21  Yes Lisabeth Rider, MD  dapagliflozin  propanediol (FARXIGA ) 10 MG TABS tablet Take 1 tablet (10 mg total) by mouth daily. 05/16/22  Yes Bensimhon, Rheta Celestine, MD  losartan  (COZAAR ) 50 MG tablet Take 1 tablet (50 mg total) by mouth daily.  11/23/21  Yes Milford, Arlice Bene, FNP  metoprolol  succinate (TOPROL -XL) 25 MG 24 hr tablet Take 25 mg by mouth daily. 01/27/21  Yes [provider]  Multiple Vitamins-Minerals (MULTIVITAMIN ADULTS) TABS Take 1 tablet by mouth daily.   Yes [provider]  spironolactone  (ALDACTONE ) 25 MG tablet Take 0.5 tablets (12.5 mg total) by mouth daily. NEEDS FOLLOW UP APPOINTMENT FOR MORE REFILLS 05/13/22  Yes Bensimhon, Rheta Celestine, MD  tadalafil  (CIALIS ) 5 MG tablet Take 1 tablet (5 mg total) by mouth daily. 11/23/22  Yes Plotnikov, Oakley Bellman, MD  torsemide  (DEMADEX ) 20 MG tablet Take 1 tablet (20 mg total) by mouth daily as needed (for weight gain > 3 lb in 24 hrs or 5 lb over dry weight). 05/13/22  Yes Bensimhon, Rheta Celestine, MD  amiodarone  (PACERONE ) 200 MG tablet Take 1 tablet (200 mg total) by mouth daily for 15 days. Then STOP 11/23/21 01/07/22  Milford, Arlice Bene, FNP    Allergies  Allergen Reactions   Bourbon [Alcohol]     Pt had a reaction to bourbon a couple of months ago: He had a small drink of bourbon and developed severe face flushing and pain in both legs with stiffness (thighs, shins) that lasted for several days.    Patient Active Problem List   Diagnosis Date Noted   Allergic reaction 02/07/2023   BPH (benign prostatic hyperplasia) 11/23/2022   Hyperglycemia 11/23/2022   Hyperlipidemia 01/07/2022  Cramps of lower extremity 09/06/2021   History of cardioembolic cerebrovascular accident (CVA) 03/09/2021   Cerebral thrombosis with cerebral infarction 02/21/2021   New onset of congestive heart failure (HCC) 02/13/2021   Acute HFrEF (heart failure with reduced ejection fraction) (HCC) 02/13/2021   AKI (acute kidney injury) (HCC) 02/13/2021   Alcohol use 02/13/2021   Paresthesia 01/27/2021   Family history of early CAD 01/27/2021   Atrial fibrillation with RVR (HCC) 01/27/2021   Urinary tract infection 01/27/2021   Atrial fibrillation and flutter (HCC) 01/27/2021    Insomnia 11/16/2020   Exposure to STD 06/13/2017   Well adult exam 06/20/2012   Cerumen impaction 06/20/2012   ABNORMAL GLUCOSE NEC 02/01/2008   Anxiety state 04/05/2007   TOBACCO USE DISORDER/SMOKER-SMOKING CESSATION DISCUSSED 04/05/2007   ERECTILE DYSFUNCTION 12/01/2006    Past Medical History:  Diagnosis Date   A-fib [facility])    Anxiety    ED (erectile dysfunction)    Elevated blood pressure     Past Surgical History:  Procedure Laterality Date   BUBBLE STUDY  02/16/2021   Procedure: BUBBLE STUDY;  Surgeon: Jerryl Morin, DO;  Location: [facility] ENDOSCOPY;  Service: Cardiovascular;;   RIGHT/LEFT HEART CATH AND CORONARY ANGIOGRAPHY N/A 02/19/2021   Procedure: RIGHT/LEFT HEART CATH AND CORONARY ANGIOGRAPHY;  Surgeon: Mardell Shade, MD;  Location: [facility] INVASIVE CV LAB;  Service: Cardiovascular;  Laterality: N/A;   TEE WITHOUT CARDIOVERSION N/A 02/16/2021   Procedure: TRANSESOPHAGEAL ECHOCARDIOGRAM (TEE);  Surgeon: Jerryl Morin, DO;  Location: [facility] ENDOSCOPY;  Service: Cardiovascular;  Laterality: N/A;    Social History   Socioeconomic History   Marital status: Divorced    Spouse name: Not on file   Number of children: 2   Years of education: Not on file   Highest education level: Some college, no degree  Occupational History   Occupation: Merchandiser, retail    Comment: [employer]  Tobacco Use   Smoking status: Never   Smokeless tobacco: Current    Types: Chew  Vaping Use   Vaping status: Never Used  Substance and Sexual Activity   Alcohol use: Yes    Alcohol/week: 17.0 standard drinks of alcohol    Types: 17 Shots of liquor per week    Comment: drinks fifth of liquor per week x 1 year.   Drug use: No   Sexual activity: Yes  Other Topics Concern   Not on file  Social History Narrative   Regular Exercise -  YES         Social Drivers of Health   Financial Resource Strain: High Risk (02/15/2021)   Overall Financial Resource Strain (CARDIA)    Difficulty of Paying Living Expenses:  Hard  Food Insecurity: No Food Insecurity (02/15/2021)   Hunger Vital Sign    Worried About Running Out of Food in the Last Year: Never true    Ran Out of Food in the Last Year: Never true  Transportation Needs: No Transportation Needs (02/15/2021)   PRAPARE - Administrator, Civil Service (Medical): No    Lack of Transportation (Non-Medical): No  Physical Activity: Not on file  Stress: Not on file  Social Connections: Not on file  Intimate Partner Violence: Not on file    Family History  Problem Relation Age of Onset   Heart disease Father 3       MI     Review of Systems  Constitutional: Negative.  Negative for chills and fever.  HENT:  Positive for hearing loss and tinnitus.  Negative for congestion and sore throat.   Respiratory: Negative.  Negative for cough and shortness of breath.   Cardiovascular: Negative.  Negative for chest pain and palpitations.  Gastrointestinal:  Negative for abdominal pain, diarrhea, nausea and vomiting.  Genitourinary: Negative.  Negative for dysuria and hematuria.  Neurological:  Positive for dizziness. Negative for sensory change, speech change, focal weakness, loss of consciousness and headaches.  All other systems reviewed and are negative.   Vitals:   03/06/23 1504  BP: 130/86  Pulse: 86  SpO2: 96%    Physical Exam Vitals reviewed.  Constitutional:      Appearance: Normal appearance.  HENT:     Head: Normocephalic.     Right Ear: Tympanic membrane, ear canal and external ear normal.     Left Ear: Tympanic membrane, ear canal and external ear normal.     Mouth/Throat:     Mouth: Mucous membranes are moist.     Pharynx: Oropharynx is clear.  Eyes:     Extraocular Movements: Extraocular movements intact.     Pupils: Pupils are equal, round, and reactive to light.  Neck:     Vascular: No carotid bruit.  Cardiovascular:     Rate and Rhythm: Normal rate and regular rhythm.     Pulses: Normal pulses.     Heart sounds:  Normal heart sounds.  Pulmonary:     Effort: Pulmonary effort is normal.     Breath sounds: Normal breath sounds.  Musculoskeletal:     Cervical back: No tenderness.  Lymphadenopathy:     Cervical: No cervical adenopathy.  Skin:    General: Skin is warm and dry.  Neurological:     General: No focal deficit present.     Mental Status: He is alert and oriented to person, place, and time.  Psychiatric:        Mood and Affect: Mood normal.        Behavior: Behavior normal.      ASSESSMENT & PLAN: A total of 32 minutes was spent with the patient and counseling/coordination of care regarding preparing for this visit, review of most recent office visit notes, review of multiple chronic medical conditions and their management, review of all medications, differential diagnosis of hearing loss and need for ENT evaluation, review of most recent bloodwork results, review of health maintenance items, education on nutrition, prognosis, documentation, and need for follow up.   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  May be related to activities of daily life.  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  Referral placed today.      Relevant Medications   meclizine  (ANTIVERT ) 25 MG tablet   Other Relevant Orders   Ambulatory referral to ENT     Other   Dizziness   Most likely secondary to inner ear problem. Labyrinthitis suspected May benefit from meclizine  25 mg every 6-8 hours as needed Recommend ENT evaluation.  Referral placed today.      Relevant Medications   meclizine  (ANTIVERT ) 25 MG tablet   Other Relevant Orders   Ambulatory referral to ENT   Balance problem   May benefit from meclizine  25 mg every 6-8 hours as needed      Relevant Medications   meclizine  (ANTIVERT ) 25 MG tablet   Other Relevant Orders   Ambulatory referral to ENT   Patient  Instructions  Dizziness Dizziness is a common problem. It makes you feel unsteady or light-headed. You may feel like you're about to faint. Dizziness can lead to getting hurt if you stumble or fall. It's more common to feel dizzy if you're an older adult. Many things can cause you to feel dizzy. These include: Medicines. Dehydration. This is when there's not enough water in your body. Illness. Follow these instructions at home: Eating and drinking  Drink enough fluid to keep your pee (urine) pale yellow. This helps keep you from getting dehydrated. Try to drink more clear fluids, such as water. Do not drink alcohol. Try to limit how much caffeine you take in. Try to limit how much salt, also called sodium, you take in. Activity Try not to make quick movements. Stand up slowly from sitting in a chair. Steady yourself until you feel okay. In the morning, first sit up on the side of the bed. When you feel okay, hold onto something and slowly stand up. Do this until you know that your balance is okay. If you need to stand in one place for a long time, move your legs often. Tighten and relax the muscles in your legs while you're standing. Do not drive or use machines if you feel dizzy. Avoid bending down if you feel dizzy. Place items in your home so you can reach them without leaning over. Lifestyle Do not smoke, vape, or use products with nicotine or tobacco in them. If you need help quitting, talk with your health care provider. Try to lower your stress level. You can do this by using methods like yoga or meditation. Talk with your provider if you need help. General instructions Watch your dizziness for any changes. Take your medicines only as told by your provider. Talk with your provider if you think you're dizzy because of a medicine you're taking. Tell a friend or a family member that you're feeling dizzy. If they spot any changes in your behavior, have them call your  provider. Contact a health care provider if: Your dizziness doesn't go away, or you have new symptoms. Your dizziness gets worse. You feel like you may vomit. You have trouble hearing. You have a fever. You have neck pain or a stiff neck. You fall or get hurt. Get help right away if: You vomit each time you eat or drink. You have watery poop and can't eat or drink. You have trouble talking, walking, swallowing, or using your arms, hands, or legs. You feel very weak. You're bleeding. You're not thinking clearly, or you have trouble forming sentences. A friend or family member may spot this. Your vision changes, or you get a very bad headache. These symptoms may be an emergency. Call 911 right away. Do not wait to see if the symptoms will go away. Do not drive yourself to the hospital. This information is not intended to replace advice given to you by your health care provider. Make sure you discuss any questions you have with your health care provider. Document Revised: 10/13/2022 Document Reviewed: 02/24/2022 Elsevier Patient Education  2024 Elsevier Inc.    Maryagnes Small, MD [facility]

## 2023-03-06 NOTE — Assessment & Plan Note (Signed)
 May benefit from meclizine  25 mg every 6-8 hours as needed

## 2023-03-06 NOTE — Patient Instructions (Signed)
 Dizziness Dizziness is a common problem. It makes you feel unsteady or light-headed. You may feel like you're about to faint. Dizziness can lead to getting hurt if you stumble or fall. It's more common to feel dizzy if you're an older adult. Many things can cause you to feel dizzy. These include: Medicines. Dehydration. This is when there's not enough water in your body. Illness. Follow these instructions at home: Eating and drinking  Drink enough fluid to keep your pee (urine) pale yellow. This helps keep you from getting dehydrated. Try to drink more clear fluids, such as water. Do not drink alcohol. Try to limit how much caffeine you take in. Try to limit how much salt, also called sodium, you take in. Activity Try not to make quick movements. Stand up slowly from sitting in a chair. Steady yourself until you feel okay. In the morning, first sit up on the side of the bed. When you feel okay, hold onto something and slowly stand up. Do this until you know that your balance is okay. If you need to stand in one place for a long time, move your legs often. Tighten and relax the muscles in your legs while you're standing. Do not drive or use machines if you feel dizzy. Avoid bending down if you feel dizzy. Place items in your home so you can reach them without leaning over. Lifestyle Do not smoke, vape, or use products with nicotine or tobacco in them. If you need help quitting, talk with your health care provider. Try to lower your stress level. You can do this by using methods like yoga or meditation. Talk with your provider if you need help. General instructions Watch your dizziness for any changes. Take your medicines only as told by your provider. Talk with your provider if you think you're dizzy because of a medicine you're taking. Tell a friend or a family member that you're feeling dizzy. If they spot any changes in your behavior, have them call your provider. Contact a health care  provider if: Your dizziness doesn't go away, or you have new symptoms. Your dizziness gets worse. You feel like you may vomit. You have trouble hearing. You have a fever. You have neck pain or a stiff neck. You fall or get hurt. Get help right away if: You vomit each time you eat or drink. You have watery poop and can't eat or drink. You have trouble talking, walking, swallowing, or using your arms, hands, or legs. You feel very weak. You're bleeding. You're not thinking clearly, or you have trouble forming sentences. A friend or family member may spot this. Your vision changes, or you get a very bad headache. These symptoms may be an emergency. Call 911 right away. Do not wait to see if the symptoms will go away. Do not drive yourself to the hospital. This information is not intended to replace advice given to you by your health care provider. Make sure you discuss any questions you have with your health care provider. Document Revised: 10/13/2022 Document Reviewed: 02/24/2022 Elsevier Patient Education  2024 [employer].

## 2023-03-06 NOTE — Telephone Encounter (Signed)
 Patient called in for an update on his referral to an ear specialist due to his vertigo.

## 2023-03-06 NOTE — Assessment & Plan Note (Signed)
 Fairly recent onset.  May be related to activities of daily life.  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  Referral placed today.

## 2023-03-07 NOTE — Telephone Encounter (Addendum)
Dr. Alvy Bimler has already referred to ENT.  Thanks

## 2023-03-08 ENCOUNTER — Other Ambulatory Visit (HOSPITAL_COMMUNITY): Payer: Self-pay | Admitting: Family Medicine

## 2023-03-14 NOTE — Progress Notes (Incomplete)
Expand All Collapse All     ADVANCED HF CLINIC NOTE     Plotnikov, Georgina Quint, MD @[facility] @ None HF Cardiologist: Dr. Gala Romney  Chief Complaint: Chronic systolic CHF with recovered EF  HPI: Eddie Dixon is a 58 y.o. male  with chronic systolic CHF, PAF, hx ETOH abuse.   Admitted 01/23 with new CHF and Afib with RVR. Echo with EF 20-25%, RV moderately reduced, mild to moderate MR, moderate TR. 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 R/LHC with no CAD, well-compensated filling pressures and preserved CO. cMRI with LVEF 21%, RVEF 25%, no LGE. Course futher c/b cardioembolic stroke.  He later converted to SR after discharge on po amiodarone.  Echo 05/23: EF 50-55%  Sleep study 05/23 negative for sleep apnea  He was last seen in the clinic in 10/23. He is here today for overdue CHF follow-up.  ROS: All systems negative except as listed in HPI, PMH and Problem List.  SH:  Social History   Socioeconomic History   Marital status: Divorced    Spouse name: Not on file   Number of children: 2   Years of education: Not on file   Highest education level: Some college, no degree  Occupational History   Occupation: Merchandiser, retail    Comment: [employer]  Tobacco Use   Smoking status: Never   Smokeless tobacco: Current    Types: Chew  Vaping Use   Vaping status: Never Used  Substance and Sexual Activity   Alcohol use: Yes    Alcohol/week: 17.0 standard drinks of alcohol    Types: 17 Shots of liquor per week    Comment: drinks fifth of liquor per week x 1 year.   Drug use: No   Sexual activity: Yes  Other Topics Concern   Not on file  Social History Narrative   Regular Exercise -  YES         Social Drivers of Health   Financial Resource Strain: High Risk (02/15/2021)   Overall Financial Resource Strain (CARDIA)    Difficulty of Paying Living Expenses: Hard  Food Insecurity: No Food Insecurity (02/15/2021)   Hunger Vital Sign    Worried About Running Out of Food in the  Last Year: Never true    Ran Out of Food in the Last Year: Never true  Transportation Needs: No Transportation Needs (02/15/2021)   PRAPARE - Administrator, Civil Service (Medical): No    Lack of Transportation (Non-Medical): No  Physical Activity: Not on file  Stress: Not on file  Social Connections: Not on file  Intimate Partner Violence: Not on file    FH:  Family History  Problem Relation Age of Onset   Heart disease Father 77       MI    Past Medical History:  Diagnosis Date   A-fib [facility])    Anxiety    ED (erectile dysfunction)    Elevated blood pressure     Current Outpatient Medications  Medication Sig Dispense Refill   amiodarone (PACERONE) 200 MG tablet Take 1 tablet (200 mg total) by mouth daily for 15 days. Then STOP 15 tablet 0   apixaban (ELIQUIS) 5 MG TABS tablet Take 1 tablet (5 mg total) by mouth 2 (two) times daily. 180 tablet 0   atorvastatin (LIPITOR) 40 MG tablet TAKE 1 TABLET (40 MG TOTAL) BY MOUTH DAILY. NEEDS FOLLOW UP APPOINTMENT FOR MORE REFILLS 90 tablet 0   carvedilol (COREG) 6.25 MG tablet Take  1 tablet (6.25 mg total) by mouth 2 (two) times daily with a meal. 60 tablet 3   Cholecalciferol (VITAMIN D3) 50 MCG (2000 UT) capsule Take 1 capsule (2,000 Units total) by mouth daily. 100 capsule 3   co-enzyme Q-10 30 MG capsule Take 3 capsules (90 mg total) by mouth 2 (two) times daily. 1 capsule 1   dapagliflozin propanediol (FARXIGA) 10 MG TABS tablet Take 1 tablet (10 mg total) by mouth daily. 90 tablet 3   losartan (COZAAR) 50 MG tablet TAKE 1 TABLET BY MOUTH EVERY DAY 90 tablet 0   meclizine (ANTIVERT) 25 MG tablet Take 1 tablet (25 mg total) by mouth 3 (three) times daily as needed for dizziness. 30 tablet 0   metoprolol succinate (TOPROL-XL) 25 MG 24 hr tablet Take 25 mg by mouth daily.     Multiple Vitamins-Minerals (MULTIVITAMIN ADULTS) TABS Take 1 tablet by mouth daily.     spironolactone (ALDACTONE) 25 MG tablet Take 0.5 tablets (12.5  mg total) by mouth daily. NEEDS FOLLOW UP APPOINTMENT FOR MORE REFILLS 45 tablet 0   tadalafil (CIALIS) 5 MG tablet Take 1 tablet (5 mg total) by mouth daily. 30 tablet 11   torsemide (DEMADEX) 20 MG tablet Take 1 tablet (20 mg total) by mouth daily as needed (for weight gain > 3 lb in 24 hrs or 5 lb over dry weight). 30 tablet 3   No current facility-administered medications for this visit.    There were no vitals filed for this visit.  PHYSICAL EXAM:  General:  Well appearing. No resp difficulty HEENT: normal Neck: supple. JVP flat. Carotids 2+ bilaterally; no bruits. No lymphadenopathy or thryomegaly appreciated. Cor: PMI normal. Regular rate & rhythm. No rubs, gallops or murmurs. Lungs: clear Abdomen: soft, nontender, nondistended. No hepatosplenomegaly. No bruits or masses. Good bowel sounds. Extremities: no cyanosis, clubbing, rash, edema Neuro: alert & orientedx3, cranial nerves grossly intact. Moves all 4 extremities w/o difficulty. Affect pleasant.   ECG:    ASSESSMENT & PLAN:  Chronic systolic CHF: - Suspect tachymediated +- ETOH.  - Echo (1/23): EF 20-25%, RV moderately reduced, biatrial enlargement, mild to moderate MR, moderate TR - TEE (1/23): EF 30-35%, RV mildly reduced, LAA thrombus, possible  - cMRI (1/23): LVEF 21% RV 25%. LAA thrombus. No scar. NICM Tachy mediated + ETOH.  - R/LHC (1/23): minimal CAD, well compensated filling pressures and normal output.  - Echo (5/23), EF improved 50-55% - NYHA I-II ***. Volume *** *** - Continue losartan 50 mg daily - Continue Coreg 6.25 mg BID - Continue spiro 12.5 mg daily - Continue Farxiga 10 mg daily - Labs today.   2. Atrial fibrillation  - Newly diagnosed 01/23. In setting of ETOH use. No longer drinking bourbon. - LAA thrombus noted on TEE 01/23, DCCV deferred. Converted to NSR on amio. - Sleep study 5/23 negative for OSA, AHI 3.9/hr - Amiodarone stopped in 10/23. *** Rhythm - Continue Eliquis 5 mg bid.  -  ETOH ***   3. ETOH abuse - Discussed importance of cessation.  - He has cut down more, now 2 beers/day. No bourbon.   4. 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 Felt to be cardioembolic from Afib and cardiomyopathy - No deficits. On Eliquis. - Follows with Dr. Pearlean Brownie.    Follow-up:   Anna Genre, PA-C

## 2023-03-15 ENCOUNTER — Encounter (HOSPITAL_COMMUNITY): Payer: Managed Care, Other (non HMO)

## 2023-03-16 ENCOUNTER — Encounter (HOSPITAL_COMMUNITY): Payer: Managed Care, Other (non HMO)

## 2023-03-22 ENCOUNTER — Encounter (HOSPITAL_COMMUNITY): Payer: Managed Care, Other (non HMO)

## 2023-04-07 IMAGING — CR DG CHEST 2V
2 series · 2 of 2 positions shown · non-contrast
Comparison: None.

CLINICAL DATA: Atrial fibrillation

EXAM:
CHEST - 2 VIEW

[chest pa]
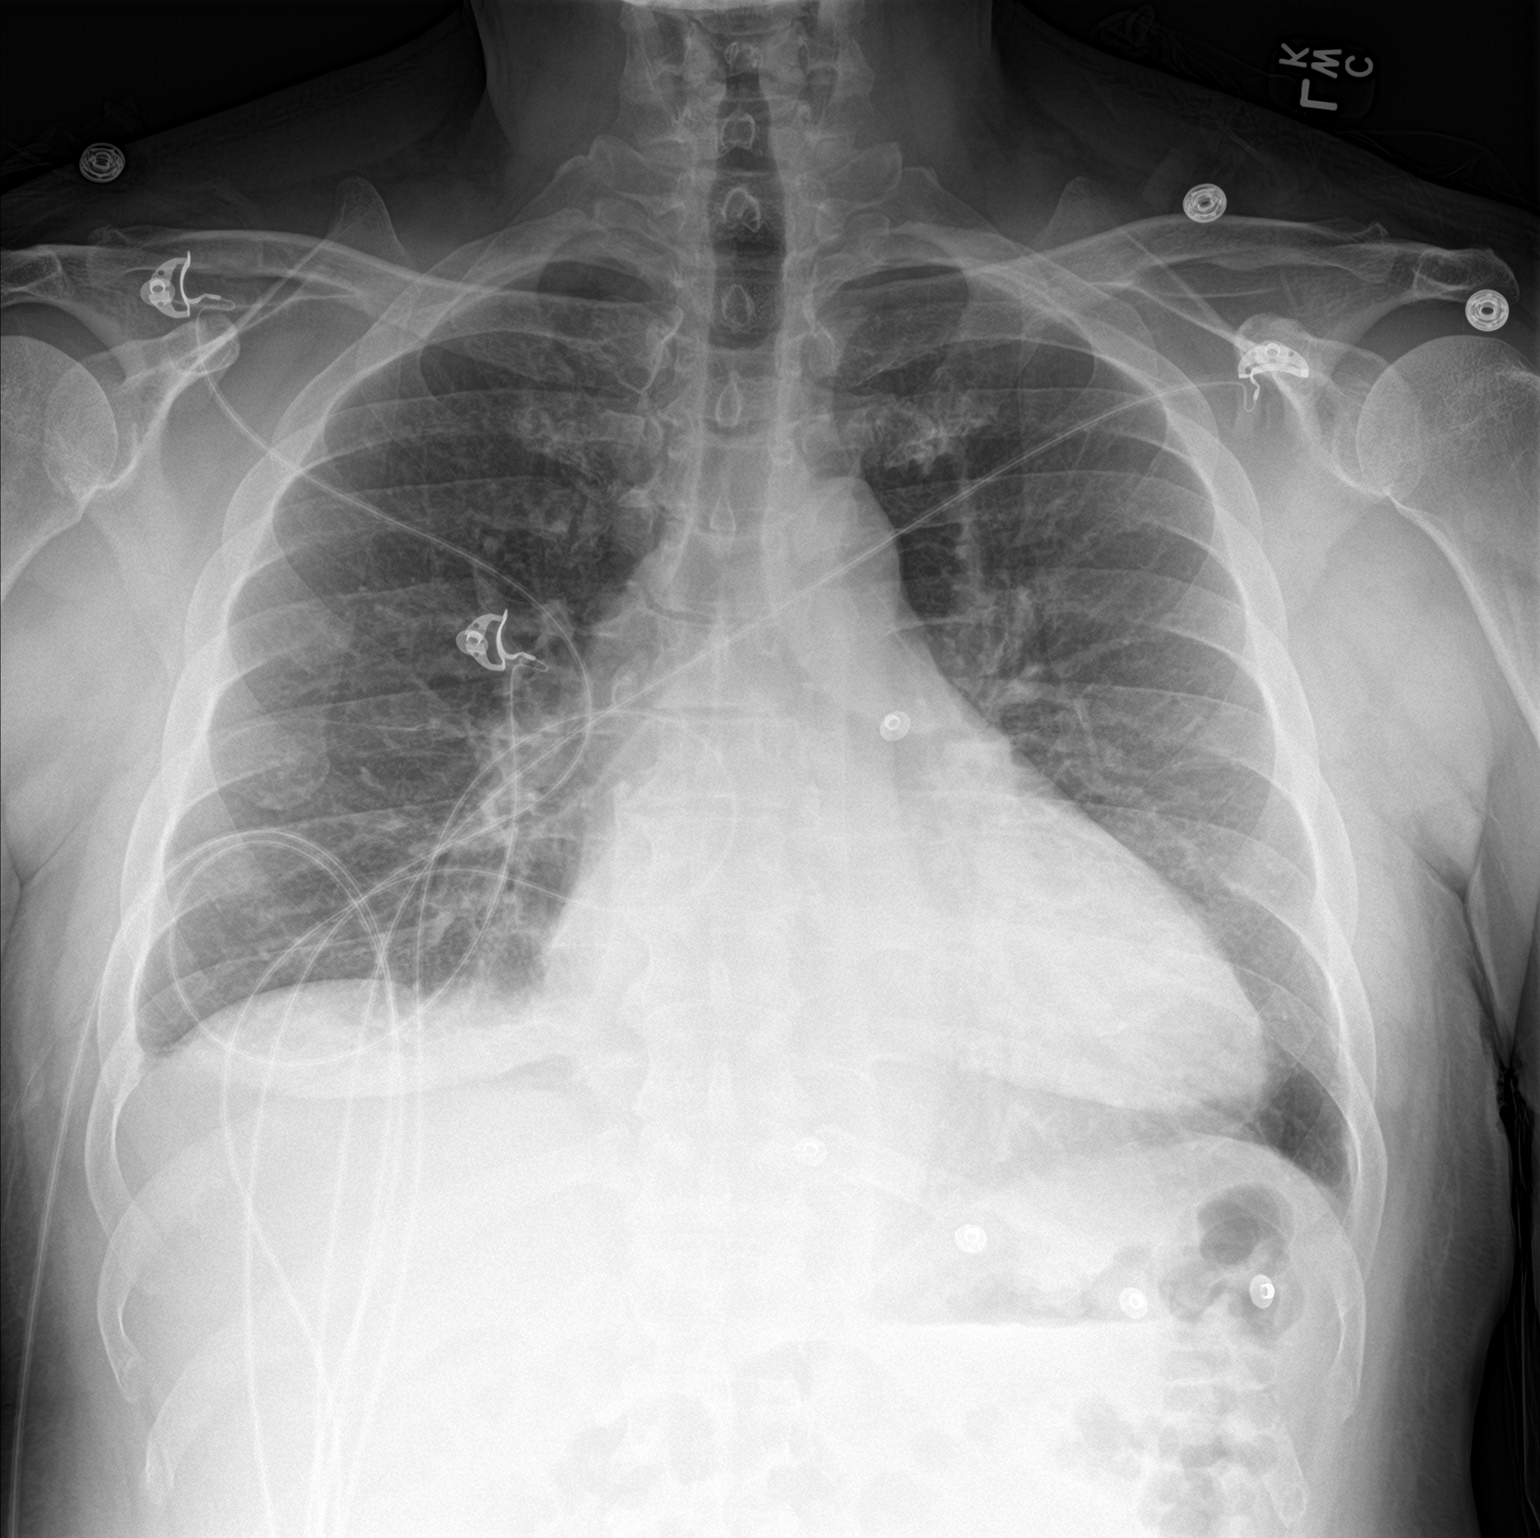

[chest lat]
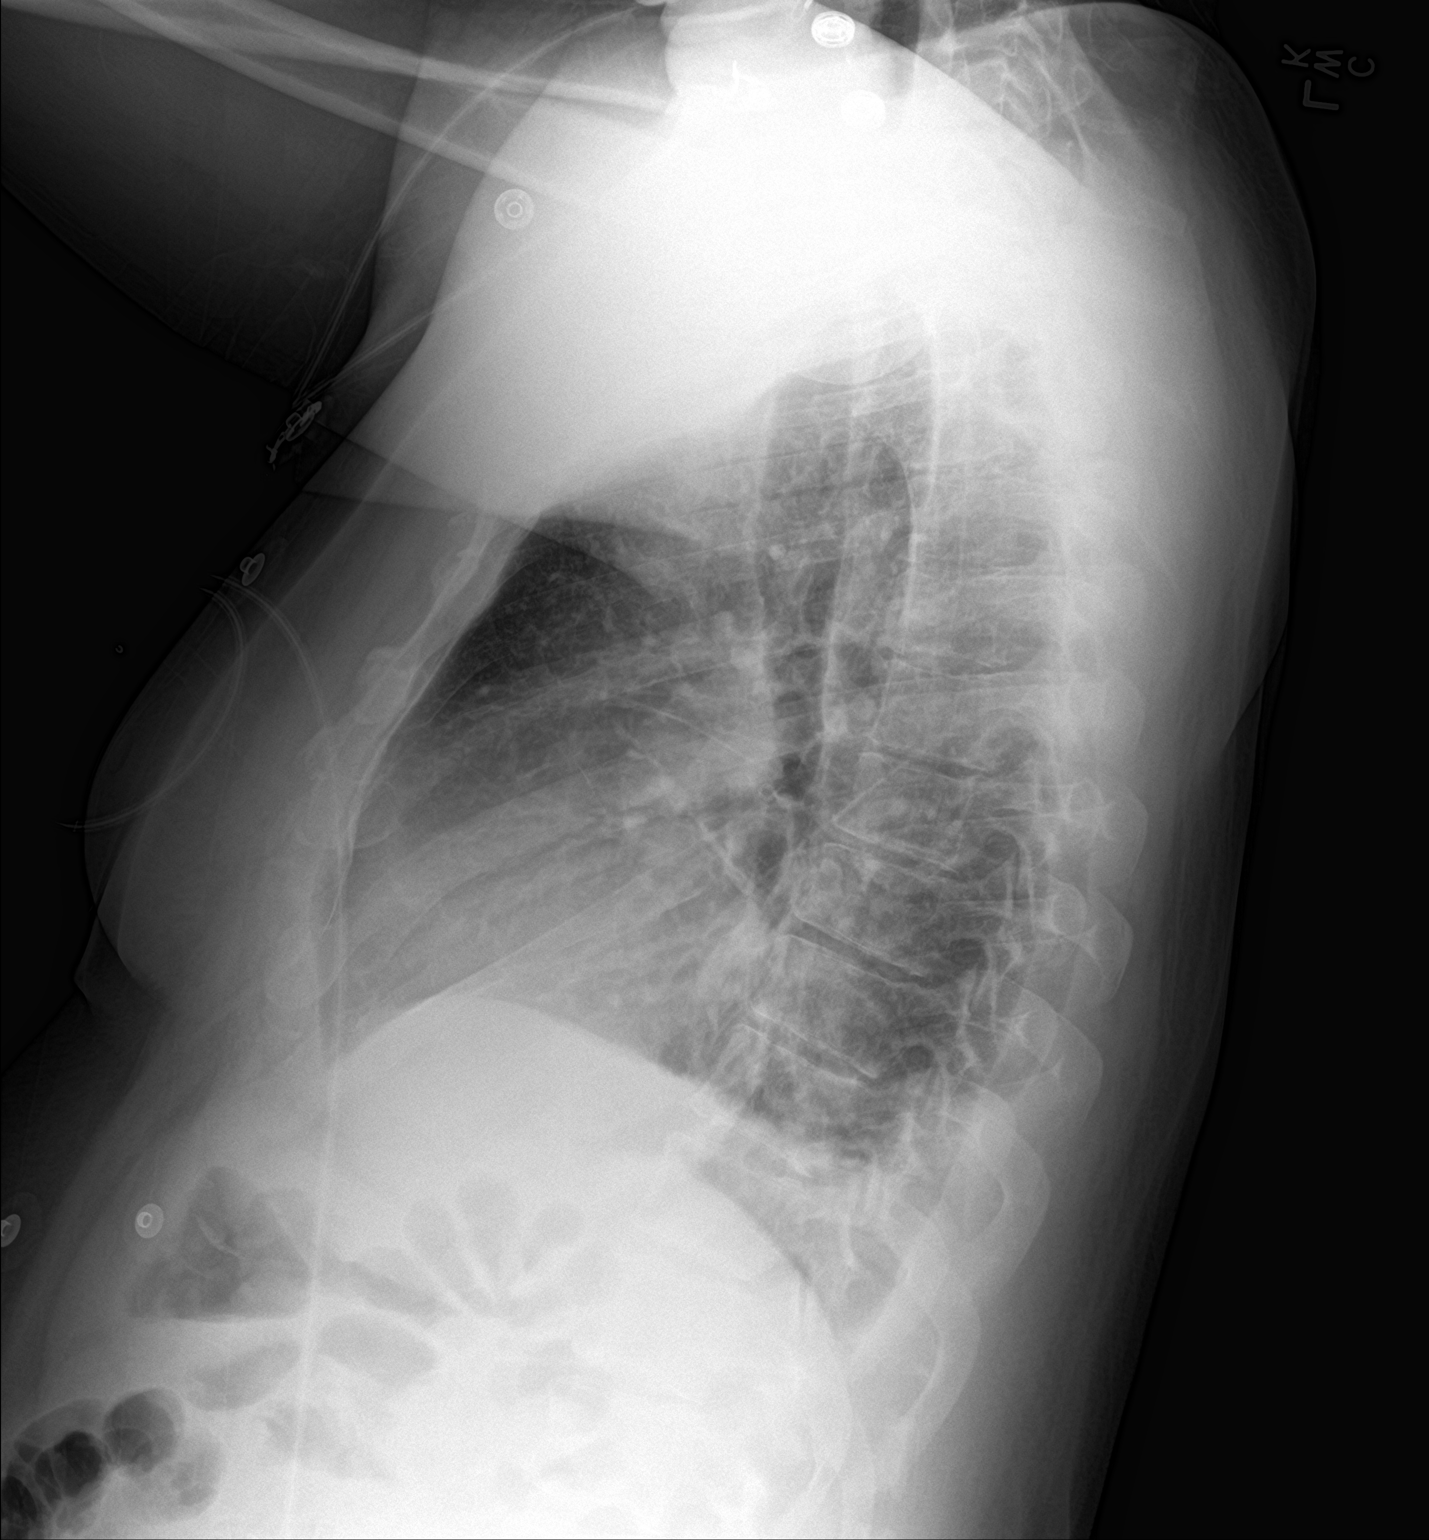

[2 of 2 positions shown; findings below may reference images not displayed]

FINDINGS: Cardiomegaly noted without CHF. Increased basilar streaky opacities
favored to be atelectasis. No definite focal pneumonia, collapse or
consolidation. No large effusion or pneumothorax. Trachea midline.
Minor endplate degenerative changes of the spine. Monitor leads
overlie the chest.
IMPRESSION: Cardiomegaly and bibasilar atelectasis.

## 2023-04-07 IMAGING — CT CT ANGIO CHEST
2 of 7 series · 19 of 46 positions shown · IV contrast (APPLIED)
Comparison: None.

CLINICAL DATA: Pulmonary embolism (PE) suspected, positive D-dimer

EXAM:
CT ANGIOGRAPHY CHEST WITH CONTRAST
TECHNIQUE: Multidetector CT imaging of the chest was performed using the
standard protocol during bolus administration of intravenous
contrast. Multiplanar CT image reconstructions and MIPs were
obtained to evaluate the vascular anatomy.

[Series 7: thins · axial · 0.76mm/px · z∈[+1131,+1433]mm · 16 of 488 slices shown]
[im 28/488  lung]
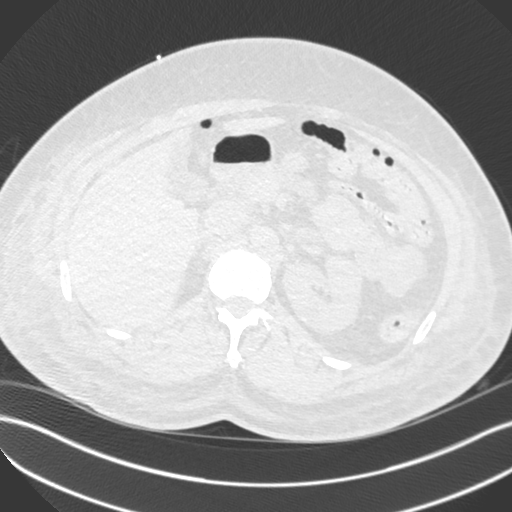
[im 55/488  soft-tissue]
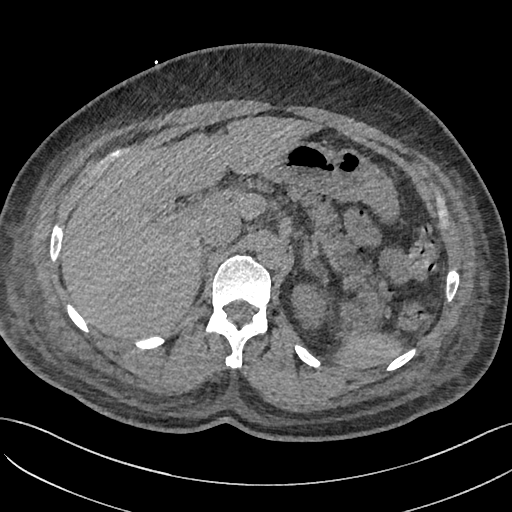
[im 82/488  lung]
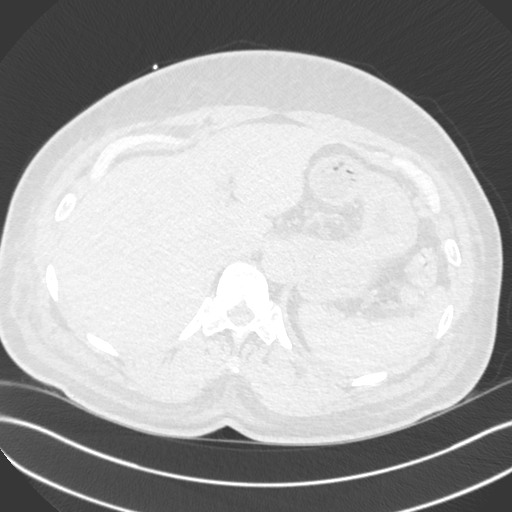
[im 109/488  soft-tissue]
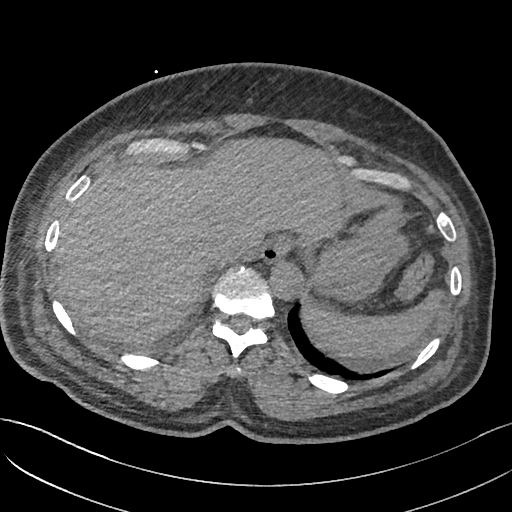
[im 136/488  lung]
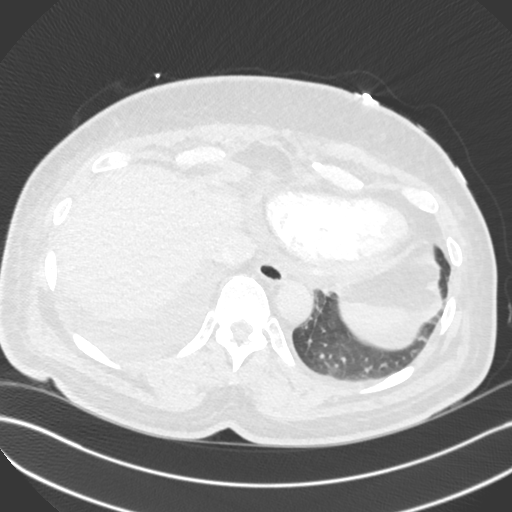
[im 163/488  soft-tissue]
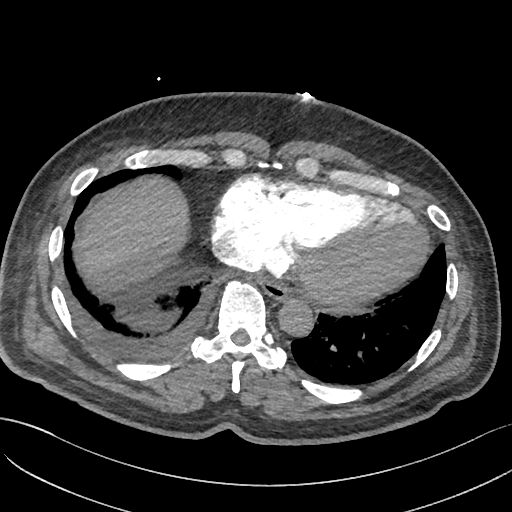
[im 190/488  lung]
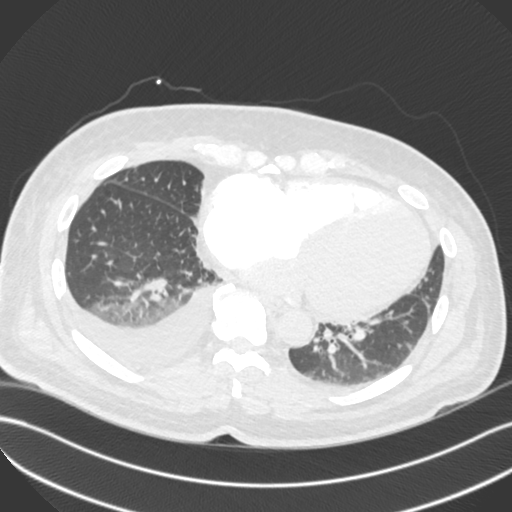
[im 217/488  soft-tissue]
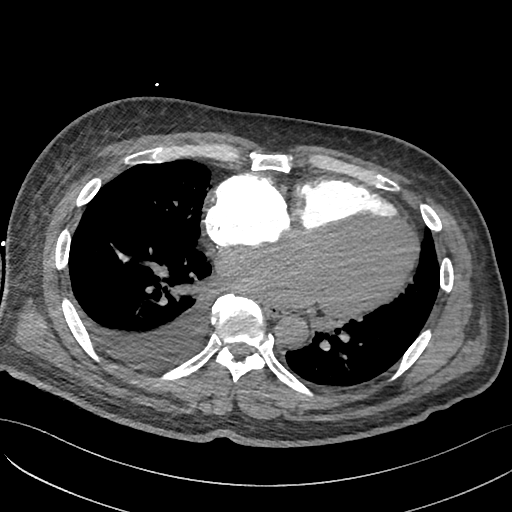
[im 271/488  lung]
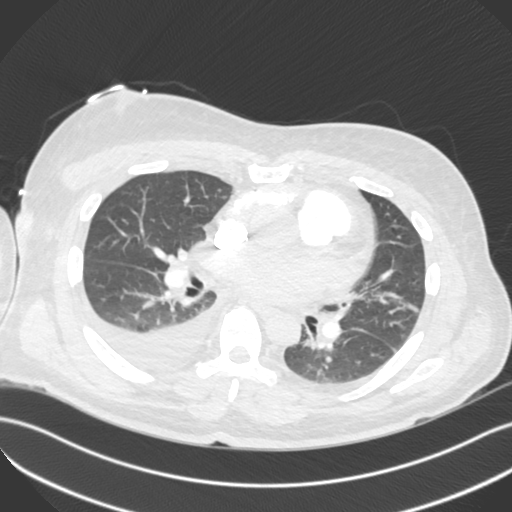
[im 298/488  soft-tissue]
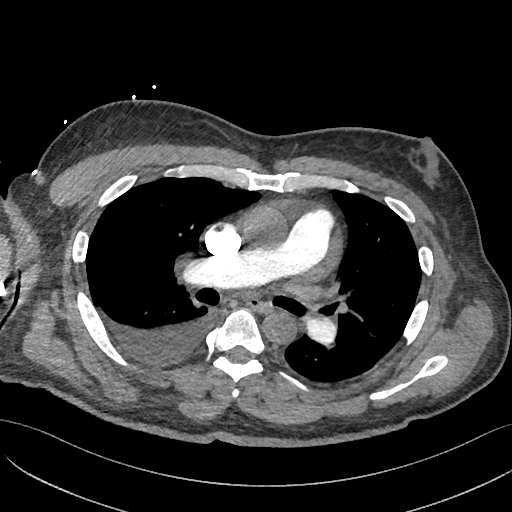
[im 325/488  lung]
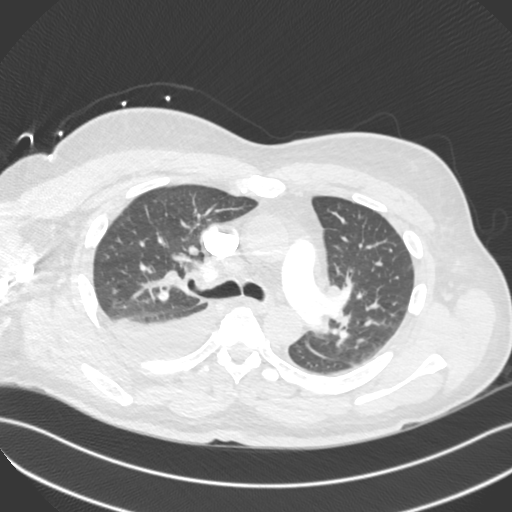
[im 352/488  soft-tissue]
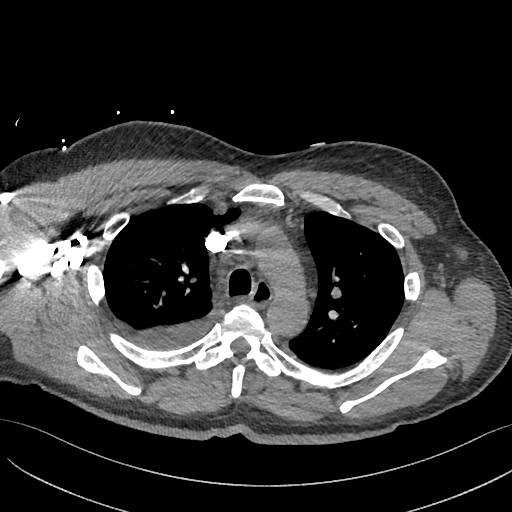
[im 379/488  lung]
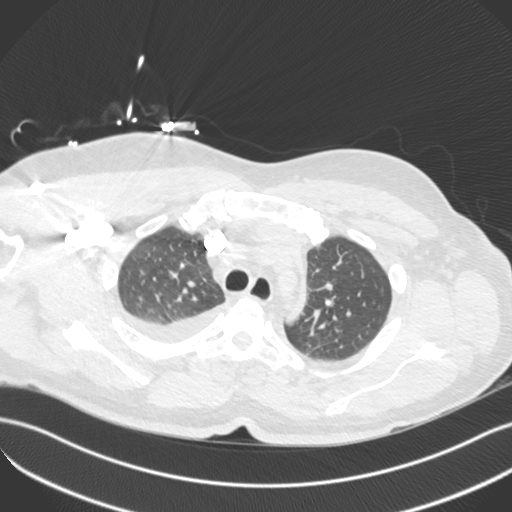
[im 406/488  soft-tissue]
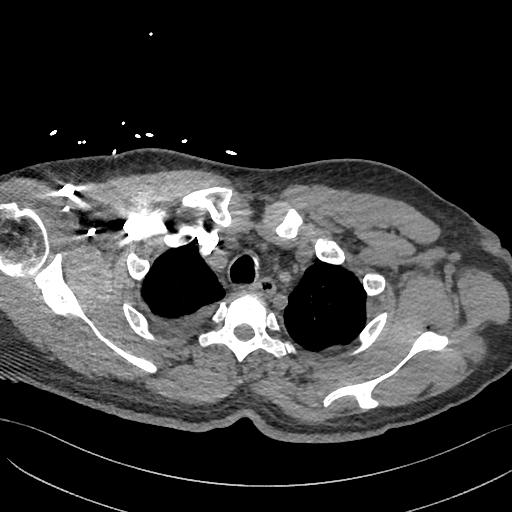
[im 433/488  lung]
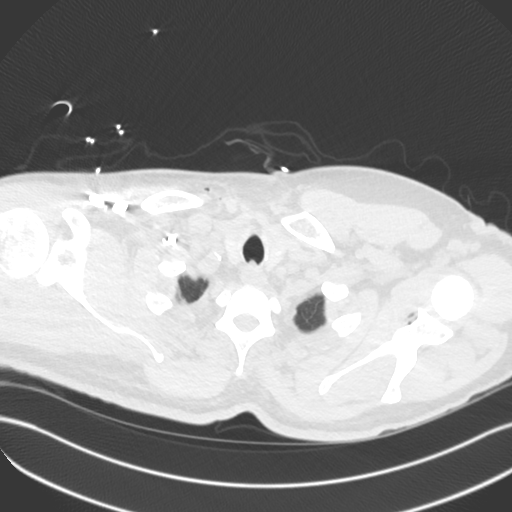
[im 460/488  soft-tissue]
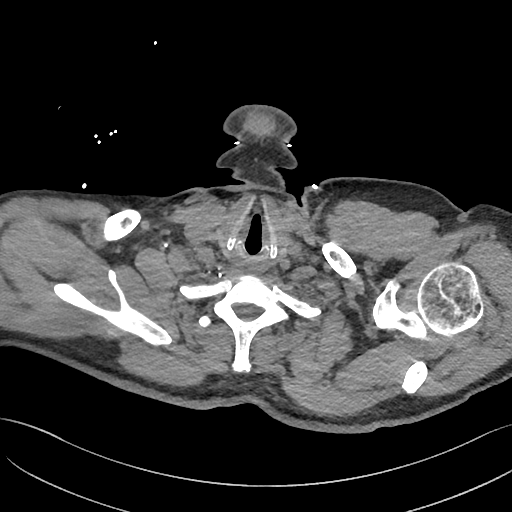

[Series 8: cor · coronal · 0.70mm/px · 3 of 141 slices shown]
[im 36/141  soft-tissue]
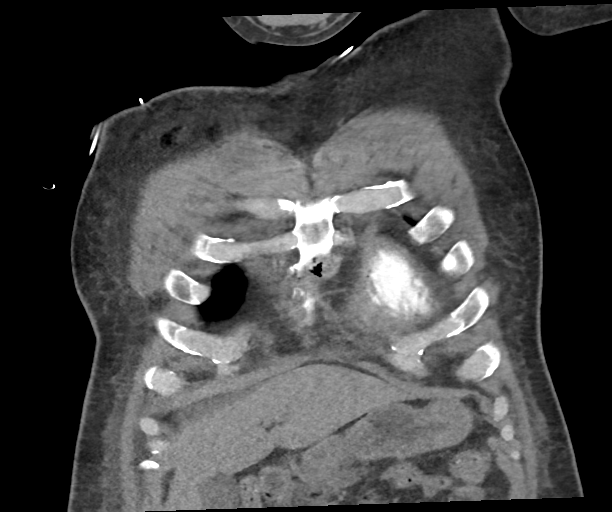
[im 71/141  soft-tissue]
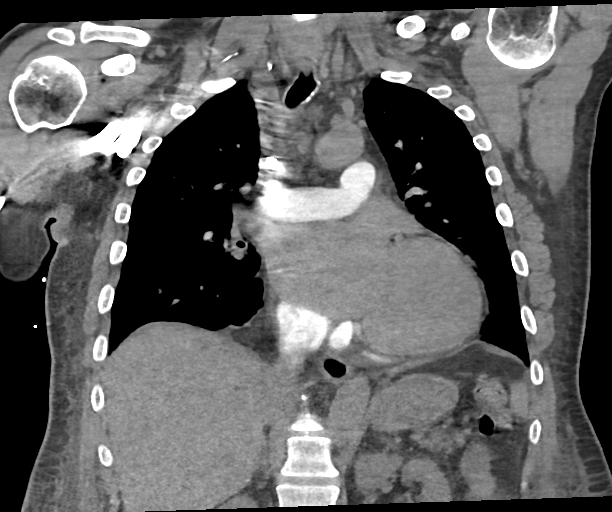
[im 106/141  soft-tissue]
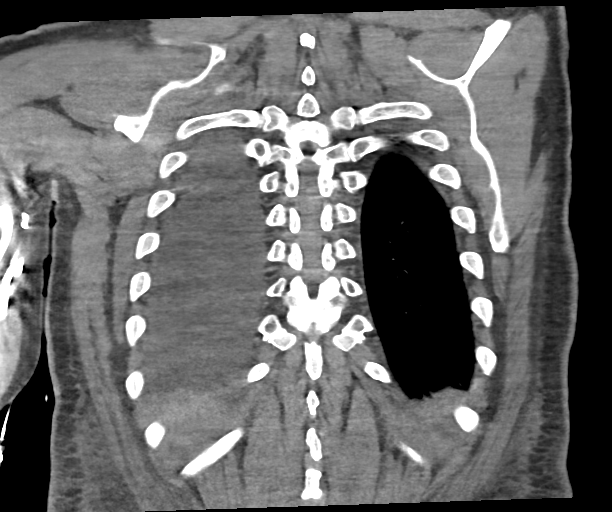

[19 of 46 positions shown; findings below may reference images not displayed]

RADIATION DOSE REDUCTION: This exam was performed according to the
departmental dose-optimization program which includes automated
exposure control, adjustment of the mA and/or kV according to
patient size and/or use of iterative reconstruction technique.

CONTRAST:  80mL OMNIPAQUE IOHEXOL 350 MG/ML SOLN
FINDINGS: Cardiovascular: Cardiomegaly. No filling defects in the pulmonary
arteries to suggest pulmonary emboli. No aortic aneurysm.

Mediastinum/Nodes: No mediastinal, hilar, or axillary adenopathy.
Trachea and esophagus are unremarkable. Thyroid unremarkable.

Lungs/Pleura: Moderate right pleural effusion. No confluent airspace
opacities.

Upper Abdomen: Imaging into the upper abdomen demonstrates no acute
findings.

Musculoskeletal: Chest wall soft tissues are unremarkable. No acute
bony abnormality.

Review of the MIP images confirms the above findings.
IMPRESSION: Moderate right pleural effusion.

Cardiomegaly.

No evidence of pulmonary embolus.

## 2023-04-11 IMAGING — DX DG CHEST 1V PORT
1 series · 1 of 1 positions shown · non-contrast
Comparison: Chest x-ray 02/13/2021

CLINICAL DATA: PICC placement

EXAM:
PORTABLE CHEST 1 VIEW

[chest]
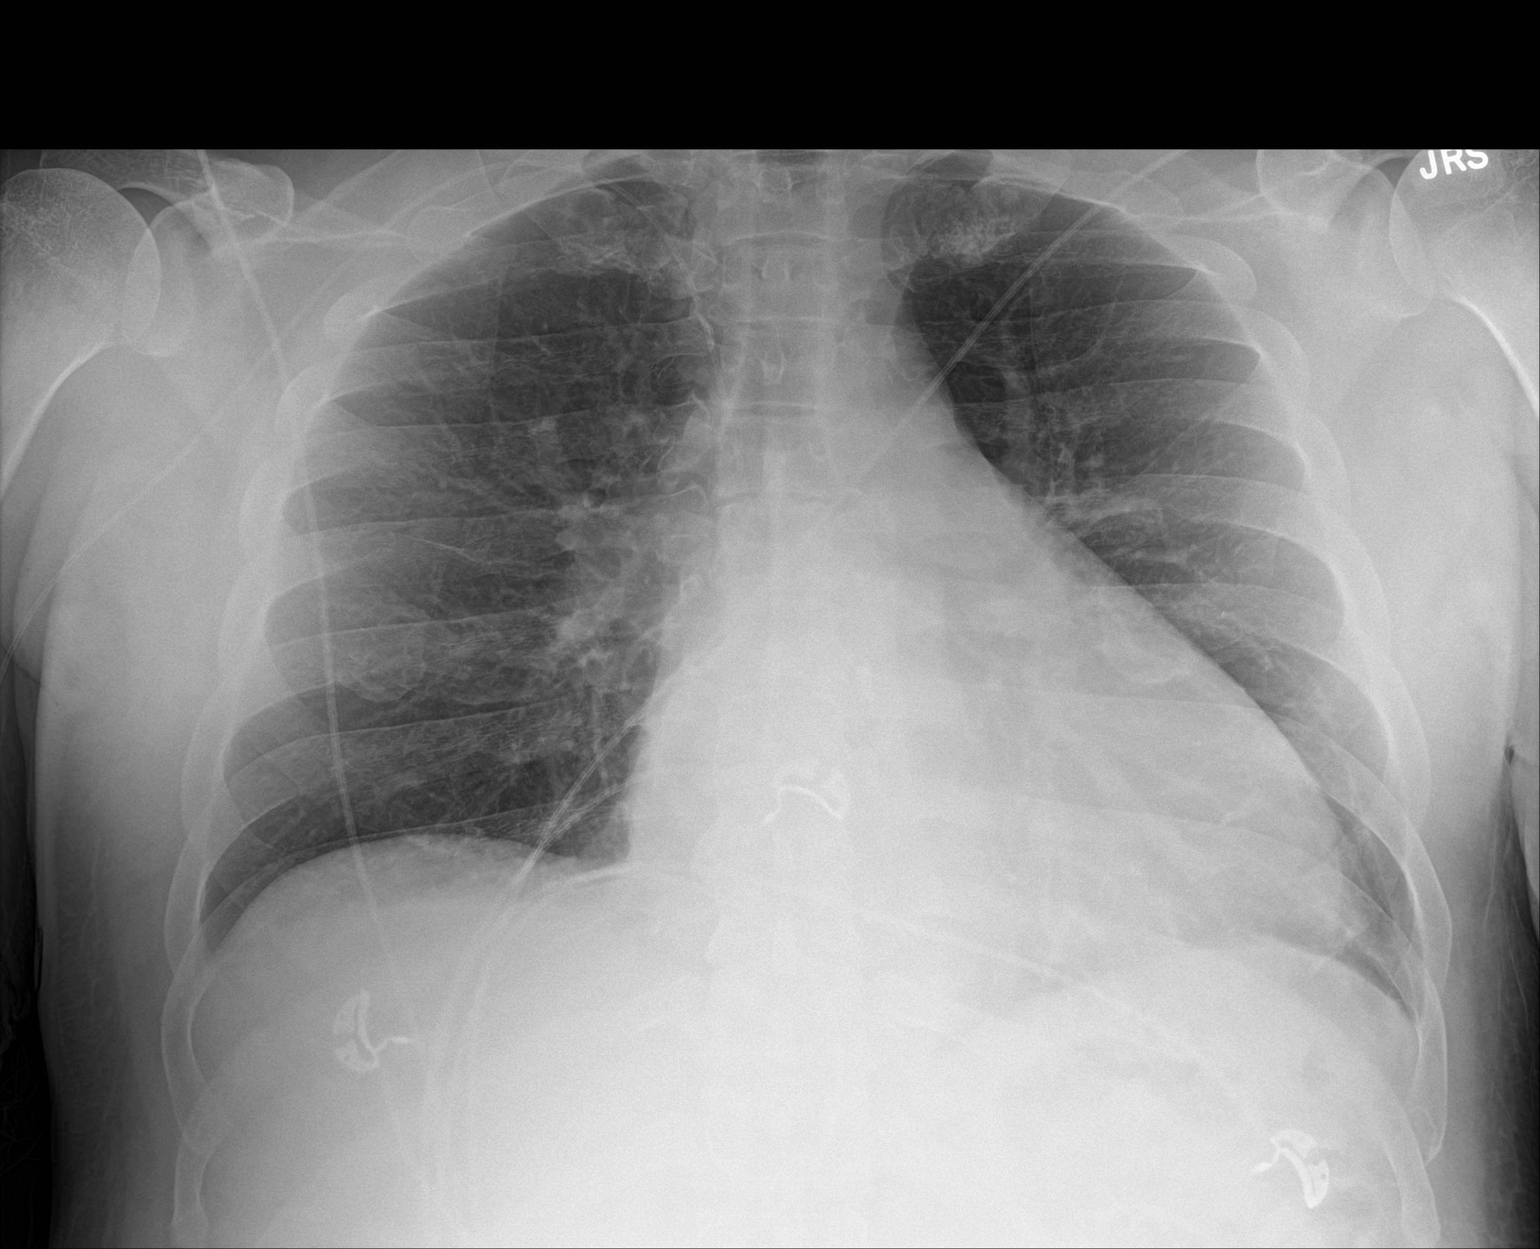

[1 of 1 positions shown; findings below may reference images not displayed]

FINDINGS: Right PICC placement with the tip in the region of the SVC.
Cardiomegaly and mediastinum appear unchanged. Pulmonary vasculature
within normal limits. Mild likely subsegmental atelectasis at the
left lung base. No pleural effusion or pneumothorax identified.
IMPRESSION: Right PICC placement with the tip in the region of the SVC. No
pneumothorax.

## 2023-04-24 NOTE — Progress Notes (Signed)
 ADVANCED HF CLINIC NOTE  Primary Care: Plotnikov, Georgina Quint, MD HF Cardiologist: Dr. Gala Romney  HPI: Eddie Dixon is a 58 y.o.male with newly diagnosed CHF and Afib. Has hx of ETOH abuse. He established care with Dr. Allyson Sabal on 01/28/21 for Afib with RVR diagnosed at PCP's office the day prior. Noted increasing dyspnea with exertion for several months. He was started on Xarelto and beta blocker.    Admitted 1/23 with a/c CHF and with AF with RVR. Beside echo with LVEF 20-25%. Started on IV lasix and carvedilol. Initially added amiodarone gtt but later stopped d/t concern for chemical conversion with nonadherence with anticoagulation. Formal echo showed EF 20-25%, RV moderately reduced, biatrial enlargement, mild to moderate MR, moderate TR. TEE showed LAA thrombus so unable to proceed with DCCV. BP were soft with sub-optimal UOP, and AHF consulted d/t concern for low-output CHF and to assess for advanced therapies. PICC placed, Co-Ox monitored. Underwent R/LHC showing minimal CAD, well compensated filling pressures and normal output. 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 cMRI showed EF 21% RV 25%. LAA thrombus. No scar. Had RUE weakness, [facility] called. CTA head and neck normal, MRI with small acute infarcts and he was placed on Eliquis. GDMT titrated and he was discharged home, weight 222 lbs.   Hospital follow up, losartan started and DC-CV arranged, but subsequently cancelled after spontaneously converting to NSR.   Echo 5/23 showed EF 50-55%. Dig stopped with improved EF. Losartan increased to 50 daily.  Sleep study 5/23 negative for OSA.  Today he returns for HF follow up. Overall feeling fine, still with daytime fatigue. Noticed weight going up and starting taking PRN torsemide yesterday. 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  Denies palpitations, CP, dizziness, edema, or PND/Orthopnea. Appetite ok. No fever or chills. Weight at home 245 pounds. PCP stopped statin  due to myalgias, with instructions to restart in 1-2 weeks but he never restarted. Myalgias resolved. Drinks 2 beers/night, no further bourbon; chew tobacco.   Cardiac Studies: - Echo (5/23) EF 50-55%, mild LVH, grade II DD, normal RV.  - Echo (1/23): EF 20-25%, RV moderately reduced, biatrial enlargement, mild to moderate MR, moderate TR  - TEE (1/23): EF 30-35%, RV mildly reduced, LAA thrombus, possible   - cMRI (1/23): LVEF 21% RV 25%. LAA thrombus. No scar. NICM Tachy mediated + ETOH.   - R/LHC (1/23):    Prox RCA lesion is 20% stenosed.   The left ventricular ejection fraction is less than 25% by visual estimate.   Ao = 96/73 (83) LV = 97/11 RA = 6 RV = 31/7 PA = 30/5 (22) PCW = 17 Fick cardiac output/index = 6.4/3.0 PVR = 0.80 WU SVR = 960 Ao sat = 95%  PA sat = 73%, 77%  SVC sat = 73%  1. Minimal nonobstructive CAD 2. Severe NICM EF ~20% 3. 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    Past Medical History:  Diagnosis Date   A-fib [facility])    Anxiety    ED (erectile dysfunction)    Elevated blood pressure    Current Outpatient Medications  Medication Sig Dispense Refill   amiodarone (PACERONE) 200 MG tablet Take 1 tablet (200 mg total) by mouth daily for 15 days. Then STOP 15 tablet 0   apixaban (ELIQUIS) 5 MG TABS tablet Take 1 tablet (5 mg total) by mouth 2 (two) times daily. 180 tablet 0   atorvastatin (LIPITOR) 40 MG tablet TAKE  1 TABLET (40 MG TOTAL) BY MOUTH DAILY. NEEDS FOLLOW UP APPOINTMENT FOR MORE REFILLS 90 tablet 0   carvedilol (COREG) 6.25 MG tablet Take 1 tablet (6.25 mg total) by mouth 2 (two) times daily with a meal. 60 tablet 3   Cholecalciferol (VITAMIN D3) 50 MCG (2000 UT) capsule Take 1 capsule (2,000 Units total) by mouth daily. 100 capsule 3   co-enzyme Q-10 30 MG capsule Take 3 capsules (90 mg total) by mouth 2 (two) times daily. 1 capsule 1   dapagliflozin propanediol (FARXIGA) 10 MG TABS tablet Take  1 tablet (10 mg total) by mouth daily. 90 tablet 3   losartan (COZAAR) 50 MG tablet TAKE 1 TABLET BY MOUTH EVERY DAY 90 tablet 0   meclizine (ANTIVERT) 25 MG tablet Take 1 tablet (25 mg total) by mouth 3 (three) times daily as needed for dizziness. 30 tablet 0   metoprolol succinate (TOPROL-XL) 25 MG 24 hr tablet Take 25 mg by mouth daily.     Multiple Vitamins-Minerals (MULTIVITAMIN ADULTS) TABS Take 1 tablet by mouth daily.     spironolactone (ALDACTONE) 25 MG tablet Take 0.5 tablets (12.5 mg total) by mouth daily. NEEDS FOLLOW UP APPOINTMENT FOR MORE REFILLS 45 tablet 0   tadalafil (CIALIS) 5 MG tablet Take 1 tablet (5 mg total) by mouth daily. 30 tablet 11   torsemide (DEMADEX) 20 MG tablet Take 1 tablet (20 mg total) by mouth daily as needed (for weight gain > 3 lb in 24 hrs or 5 lb over dry weight). 30 tablet 3   No current facility-administered medications for this visit.   Allergies  Allergen Reactions   Bourbon [Alcohol]     Pt had a reaction to bourbon a couple of months ago: He had a small drink of bourbon and developed severe face flushing and pain in both legs with stiffness (thighs, shins) that lasted for several days.    Social History   Socioeconomic History   Marital status: Divorced    Spouse name: Not on file   Number of children: 2   Years of education: Not on file   Highest education level: Some college, no degree  Occupational History   Occupation: Merchandiser, retail    Comment: [employer]  Tobacco Use   Smoking status: Never   Smokeless tobacco: Current    Types: Chew  Vaping Use   Vaping status: Never Used  Substance and Sexual Activity   Alcohol use: Yes    Alcohol/week: 17.0 standard drinks of alcohol    Types: 17 Shots of liquor per week    Comment: drinks fifth of liquor per week x 1 year.   Drug use: No   Sexual activity: Yes  Other Topics Concern   Not on file  Social History Narrative   Regular Exercise -  YES         Social Drivers of Health    Financial Resource Strain: High Risk (02/15/2021)   Overall Financial Resource Strain (CARDIA)    Difficulty of Paying Living Expenses: Hard  Food Insecurity: No Food Insecurity (02/15/2021)   Hunger Vital Sign    Worried About Running Out of Food in the Last Year: Never true    Ran Out of Food in the Last Year: Never true  Transportation Needs: No Transportation Needs (02/15/2021)   PRAPARE - Administrator, Civil Service (Medical): No    Lack of Transportation (Non-Medical): No  Physical Activity: Not on file  Stress: Not on file  Social Connections: Not on file  Intimate Partner Violence: Not on file   Family History  Problem Relation Age of Onset   Heart disease Father 27       MI   There were no vitals taken for this visit.  Wt Readings from Last 3 Encounters:  03/06/23 104.3 kg (230 lb)  03/03/23 99.8 kg (220 lb)  02/07/23 105.2 kg (232 lb)   PHYSICAL EXAM: General:  NAD. No resp difficulty HEENT: Normal Neck: Supple. No JVD. Carotids 2+ bilat; no bruits. No lymphadenopathy or thryomegaly appreciated. Cor: PMI nondisplaced. Regular rate & rhythm. No rubs, gallops or murmurs. Lungs: Clear Abdomen: Soft, nontender, nondistended. No hepatosplenomegaly. No bruits or masses. Good bowel sounds. Extremities: No cyanosis, clubbing, rash, edema Neuro: Alert & oriented x 3, cranial nerves grossly intact. Moves all 4 extremities w/o difficulty. Affect pleasant.  ECG (personally reviewed): SB 56 bpm  ASSESSMENT & PLAN: Chronic systolic CHF: - Suspect tachymediated +- ETOH.  - Echo (1/23): EF 20-25%, RV moderately reduced, biatrial enlargement, mild to moderate MR, moderate TR - TEE (1/23): EF 30-35%, RV mildly reduced, LAA thrombus, possible  - cMRI (1/23): LVEF 21% RV 25%. LAA thrombus. No scar. NICM Tachy mediated + ETOH.  - R/LHC (1/23): minimal CAD, well compensated filling pressures and normal output.  - Echo (5/23), EF improved 50-55% - NYHA I-II (mainly  daytime fatigue), volume looks good today though weight up. - OK to continue torsemide for the rest of the week (20 mg daily), then change to PRN. - Continue losartan to 50 mg daily. - Continue Coreg 6.25 mg bid.  - Continue spiro 12.5 mg daily - Continue Farxiga 10 mg daily.  - BMET & BNP today.   2. Atrial fibrillation  - Newly diagnosed 2/23. In setting of ETOH use. No longer drinking bourbon. - LAA thrombus noted on TEE, DCCV deferred. Spontaneously converted to NSR on amio. - Sleep study 5/23 negative for OSA, AHI 3.9/hr - Remains in SR today on ECG. - Continue Eliquis 5 mg bid. No bleeding issues. - Decrease amiodarone to 100 mg daily x 14 days, then stop. Call if palpitations/increase heart rate recurs.  Will then refer back to EP for possible ablation. - Continue to cut back on ETOH.  - LFTs and TSH OK 8/23.   3. ETOH abuse - Discussed importance of cessation.  - He has cut down more, now 2 beers/day. No bourbon.  4. LAA Clot  - Continue Eliquis 5 mg bid. No bleeding issues. - As above discussed.   5. Stroke - MRI (1/23) new left MCA embolic - No deficits. On Eliquis. - Follows with Dr. Pearlean Brownie. - Restart atorvastatin 40 mg qod. Call if myalgias return.  6. Prediabetes - Last A1c 6.0 - Continue SGLT2i. - Restart statin as above.   Follow up in 6 months with Dr. Gala Romney. Refill all HF meds today.   Prince Rome, FNP-BC 04/24/23

## 2023-04-27 ENCOUNTER — Telehealth (HOSPITAL_COMMUNITY): Payer: Self-pay

## 2023-04-27 NOTE — Telephone Encounter (Signed)
 Called to confirm/remind patient of their appointment at the [facility] on 04/28/23***.   Appointment:   [x] Confirmed  [] Left mess   [] No answer/No voice mail  [] Phone not in service  Patient reminded to bring all medications and/or complete list.  Confirmed patient has transportation. Gave directions, instructed to utilize valet parking.

## 2023-04-28 ENCOUNTER — Ambulatory Visit (HOSPITAL_COMMUNITY)
Admission: RE | Admit: 2023-04-28 | Discharge: 2023-04-28 | Disposition: A | Discharge status: Home / Self Care | Source: Ambulatory Visit | Attending: Family Medicine | Admitting: Family Medicine

## 2023-04-28 ENCOUNTER — Other Ambulatory Visit (HOSPITAL_COMMUNITY): Payer: Self-pay

## 2023-04-28 ENCOUNTER — Encounter (HOSPITAL_COMMUNITY): Payer: Self-pay

## 2023-04-28 ENCOUNTER — Telehealth (HOSPITAL_COMMUNITY): Payer: Self-pay

## 2023-04-28 VITALS — BP 118/78 | HR 119 | Wt 236.2 lb

## 2023-04-28 DIAGNOSIS — I4891 Unspecified atrial fibrillation: Secondary | ICD-10-CM | POA: Insufficient documentation

## 2023-04-28 DIAGNOSIS — I428 Other cardiomyopathies: Secondary | ICD-10-CM | POA: Insufficient documentation

## 2023-04-28 DIAGNOSIS — Z8673 Personal history of transient ischemic attack (TIA), and cerebral infarction without residual deficits: Secondary | ICD-10-CM | POA: Insufficient documentation

## 2023-04-28 DIAGNOSIS — I5022 Chronic systolic (congestive) heart failure: Secondary | ICD-10-CM | POA: Insufficient documentation

## 2023-04-28 DIAGNOSIS — F1010 Alcohol abuse, uncomplicated: Secondary | ICD-10-CM | POA: Insufficient documentation

## 2023-04-28 DIAGNOSIS — I509 Heart failure, unspecified: Secondary | ICD-10-CM | POA: Diagnosis present

## 2023-04-28 DIAGNOSIS — F1090 Alcohol use, unspecified, uncomplicated: Secondary | ICD-10-CM | POA: Diagnosis not present

## 2023-04-28 DIAGNOSIS — I513 Intracardiac thrombosis, not elsewhere classified: Secondary | ICD-10-CM | POA: Diagnosis not present

## 2023-04-28 DIAGNOSIS — Z79899 Other long term (current) drug therapy: Secondary | ICD-10-CM | POA: Diagnosis not present

## 2023-04-28 DIAGNOSIS — Z7901 Long term (current) use of anticoagulants: Secondary | ICD-10-CM | POA: Insufficient documentation

## 2023-04-28 DIAGNOSIS — I63419 Cerebral infarction due to embolism of unspecified middle cerebral artery: Secondary | ICD-10-CM

## 2023-04-28 DIAGNOSIS — F17220 Nicotine dependence, chewing tobacco, uncomplicated: Secondary | ICD-10-CM | POA: Diagnosis not present

## 2023-04-28 LAB — BASIC METABOLIC PANEL WITH GFR
Anion gap: 9 (ref 5–15)
BUN: 19 mg/dL (ref 6–20)
CO2: 23 mmol/L (ref 22–32)
Calcium: 9.7 mg/dL (ref 8.9–10.3)
Chloride: 110 mmol/L (ref 98–111)
Creatinine, Ser: 1.26 mg/dL — ABNORMAL HIGH (ref 0.61–1.24)
GFR, Estimated: >60 mL/min (ref >60)
Glucose, Bld: 93 mg/dL (ref 70–99)
Potassium: 4.5 mmol/L (ref 3.5–5.1)
Sodium: 142 mmol/L (ref 135–145)

## 2023-04-28 LAB — BRAIN NATRIURETIC PEPTIDE: B Natriuretic Peptide: 313.1 pg/mL — ABNORMAL HIGH (ref 0.0–100.0)

## 2023-04-28 MED ORDER — TORSEMIDE 20 MG PO TABS: 20.0000 mg | ORAL_TABLET | Freq: Every day | ORAL | 3 refills | Status: DC | PRN

## 2023-04-28 MED ORDER — APIXABAN 5 MG PO TABS: 5.0000 mg | ORAL_TABLET | Freq: Two times a day (BID) | ORAL | 11 refills | Status: DC

## 2023-04-28 MED ORDER — AMIODARONE HCL 200 MG PO TABS: 200.0000 mg | ORAL_TABLET | Freq: Every day | ORAL | 3 refills | Status: DC

## 2023-04-28 MED ORDER — DAPAGLIFLOZIN PROPANEDIOL 10 MG PO TABS: 10.0000 mg | ORAL_TABLET | Freq: Every day | ORAL | 11 refills | Status: DC

## 2023-04-28 MED ORDER — AMIODARONE HCL 200 MG PO TABS
ORAL_TABLET | ORAL | 0 refills | Status: DC
Start: 2023-04-28 — End: 2023-05-17

## 2023-04-28 MED ORDER — SPIRONOLACTONE 25 MG PO TABS: 12.5000 mg | ORAL_TABLET | Freq: Every day | ORAL | 3 refills | Status: DC

## 2023-04-28 NOTE — Patient Instructions (Signed)
 Re-start Farxiga 10 mg daily - Rx sent. Coupon provided. Re-start Spiro 12.5 mg daily - Rx sent.  Re-start Eliquis 5 mg twice daily - Rx sent. Coupon provided.  Start amiodarone 200 mg twice daily for 2 weeks; then take 200 mg daily - Rx sent. Take torsemide 20 mg daily this weekend then take only as needed. Return to Heart Failure APP Clinic in 3 weeks - see below. Please call us at [phone] if any questions or concerns prior to your next appointment.

## 2023-04-28 NOTE — Telephone Encounter (Signed)
 Advanced Heart Failure Patient Advocate Encounter  Patient contacted office regarding concerns with cost of Farxiga, Eliquis.  Test claims for Marcelline Deist shows copay of $25 for 30 days or $75 for 90 days.  Test billing for Eliquis returns $60.73 for 30 days, $177.32 for 90 days   This patient is eligible to use copay savings cards with both of these medications. Information relayed to clinic staff, who will provide savings cards.  Burnell Blanks, CPhT Rx Patient Advocate Phone: [phone]

## 2023-04-28 NOTE — Progress Notes (Signed)
 ReDS Vest / Clip - 04/28/23 1500       ReDS Vest / Clip   Station Marker C    Ruler Value 30    ReDS Value Range Moderate volume overload    ReDS Actual Value 40

## 2023-05-03 ENCOUNTER — Other Ambulatory Visit (HOSPITAL_COMMUNITY): Payer: Self-pay

## 2023-05-15 NOTE — H&P (View-Only) (Signed)
 ADVANCED HF CLINIC NOTE  Primary Care: Plotnikov, Oakley Bellman, MD HF Cardiologist: Dr. Julane Ny  Reason for Visit: Heart Failure Follow-up HPI: Eddie Dixon is a 58 y.o. male with CHF and Afib. Has hx of ETOH abuse. He established care with Dr. Katheryne Pane on 01/28/21 for Afib with RVR diagnosed at PCP's office the day prior. Noted increasing dyspnea with exertion for several months. He was started on Xarelto  and beta blocker.    Admitted 1/23 with a/c CHF and with AF with RVR. Beside echo with LVEF 20-25%. Initially added amiodarone  gtt but later stopped d/t concern for chemical conversion with nonadherence with anticoagulation. Formal echo showed EF 20-25%, RV moderately reduced, BAE, mild/mod MR, mod TR. TEE showed LAA thrombus so unable to proceed with DCCV. BP were soft with sub-optimal UOP, and AHF consulted d/t concern for low-output CHF and to assess for advanced therapies. Underwent R/LHC showing minimal CAD, well compensated filling pressures and normal output. 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 cMRI showed EF 21% RV 25%. LAA thrombus. No scar. Had RUE weakness, [facility] Stroke called. CTA head and neck normal, MRI with small acute infarcts and he was placed on Eliquis . GDMT titrated and he was discharged home, weight 222 lbs.   Hospital follow up, losartan  started and DC-CV arranged, but subsequently cancelled after spontaneously converting to NSR. Echo 5/23 showed EF 50-55%. Dig stopped with improved EF. Losartan  increased to 50 daily.  Sleep study 5/23 negative for OSA.  Last seen 10/2021.  He returns today for heart failure follow up. Overall feeling poor. NYHA II-III. Reports fatigue with occasional lightheadedness. Feeling more tired and worn out at work doing normal tasks, he works as a Psychologist, occupational. Denies chest pain, near-syncope, orthopnea, palpitations, and abnormal bleeding. Able to perform ADLs.  Appetite okay. Drinking 1-2 light beers/day, no liquor. Does not smoke. Chews tobacco. Compliant  with all medications.  Cardiac Studies: - Echo (5/23) EF 50-55%, mild LVH, grade II DD, normal RV. - Echo (1/23): EF 20-25%, RV moderately reduced, biatrial enlargement, mild to moderate MR, moderate TR - TEE (1/23): EF 30-35%, RV mildly reduced, LAA thrombus, possible  - cMRI (1/23): LVEF 21% RV 25%. LAA thrombus. No scar. NICM Tachy mediated + ETOH.  - R/LHC (1/23):   Prox RCA lesion is 20% stenosed.   The left ventricular ejection fraction is less than 25% by visual estimate. Ao = 96/73 (83) LV = 97/11 RA = 6 RV = 31/7 PA = 30/5 (22) PCW = 17 Fick cardiac output/index = 6.4/3.0 PVR = 0.80 WU SVR = 960 Ao sat = 95%  PA sat = 73%, 77%  SVC sat = 73%  1. Minimal nonobstructive CAD 2. Severe NICM EF ~20% 3. 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    Past Medical History:  Diagnosis Date   A-fib [facility])    Anxiety    ED (erectile dysfunction)    Elevated blood pressure    Current Outpatient Medications  Medication Sig Dispense Refill   amiodarone  (PACERONE ) 200 MG tablet Take 1 tablet (200 mg total) by mouth daily. 90 tablet 3   apixaban  (ELIQUIS ) 5 MG TABS tablet Take 1 tablet (5 mg total) by mouth 2 (two) times daily. 60 tablet 11   atorvastatin  (LIPITOR) 40 MG tablet TAKE 1 TABLET (40 MG TOTAL) BY MOUTH DAILY. NEEDS FOLLOW UP APPOINTMENT FOR MORE REFILLS 90 tablet 0   Cholecalciferol (VITAMIN D3) 50 MCG (2000 UT) capsule Take 1 capsule (2,000 Units total) by  mouth daily. 100 capsule 3   losartan  (COZAAR ) 50 MG tablet TAKE 1 TABLET BY MOUTH EVERY DAY 90 tablet 0   Multiple Vitamins-Minerals (MULTIVITAMIN ADULTS) TABS Take 1 tablet by mouth daily.     spironolactone  (ALDACTONE ) 25 MG tablet Take 0.5 tablets (12.5 mg total) by mouth daily. 45 tablet 3   torsemide  (DEMADEX ) 20 MG tablet Take 1 tablet (20 mg total) by mouth daily as needed (for weight gain > 3 lb in 24 hrs or 5 lb over dry weight). 30 tablet 3   amiodarone  (PACERONE )  200 MG tablet Take 200 mg bid for 2 weeks (Patient not taking: Reported on 05/16/2023) 28 tablet 0   carvedilol  (COREG ) 6.25 MG tablet Take 1 tablet (6.25 mg total) by mouth 2 (two) times daily with a meal. (Patient not taking: Reported on 05/16/2023) 60 tablet 3   dapagliflozin  propanediol (FARXIGA ) 10 MG TABS tablet Take 1 tablet (10 mg total) by mouth daily before breakfast. (Patient not taking: Reported on 05/16/2023) 30 tablet 11   meclizine  (ANTIVERT ) 25 MG tablet Take 1 tablet (25 mg total) by mouth 3 (three) times daily as needed for dizziness. (Patient not taking: Reported on 05/16/2023) 30 tablet 0   No current facility-administered medications for this encounter.   Allergies  Allergen Reactions   Bourbon [Alcohol]     Pt had a reaction to bourbon a couple of months ago: He had a small drink of bourbon and developed severe face flushing and pain in both legs with stiffness (thighs, shins) that lasted for several days.    Social History   Socioeconomic History   Marital status: Divorced    Spouse name: Not on file   Number of children: 2   Years of education: Not on file   Highest education level: Some college, no degree  Occupational History   Occupation: Merchandiser, retail    Comment: [employer]  Tobacco Use   Smoking status: Never   Smokeless tobacco: Current    Types: Chew  Vaping Use   Vaping status: Never Used  Substance and Sexual Activity   Alcohol use: Yes    Alcohol/week: 17.0 standard drinks of alcohol    Types: 17 Shots of liquor per week    Comment: drinks fifth of liquor per week x 1 year.   Drug use: No   Sexual activity: Yes  Other Topics Concern   Not on file  Social History Narrative   Regular Exercise -  YES         Social Drivers of Health   Financial Resource Strain: High Risk (02/15/2021)   Overall Financial Resource Strain (CARDIA)    Difficulty of Paying Living Expenses: Hard  Food Insecurity: No Food Insecurity (02/15/2021)   Hunger Vital Sign     Worried About Running Out of Food in the Last Year: Never true    Ran Out of Food in the Last Year: Never true  Transportation Needs: No Transportation Needs (02/15/2021)   PRAPARE - Administrator, Civil Service (Medical): No    Lack of Transportation (Non-Medical): No  Physical Activity: Not on file  Stress: Not on file  Social Connections: Not on file  Intimate Partner Violence: Not on file   Family History  Problem Relation Age of Onset   Heart disease Father 13       MI   BP (!) 140/92 (BP Location: Right Arm, Patient Position: Sitting, Cuff Size: Normal)   Pulse (!) 111   Ht  5\' 9"  (1.753 m)   Wt 101.9 kg (224 lb 9.6 oz)   SpO2 98%   BMI 33.17 kg/m   Wt Readings from Last 3 Encounters:  05/16/23 101.9 kg (224 lb 9.6 oz)  04/28/23 107.1 kg (236 lb 3.2 oz)  03/06/23 104.3 kg (230 lb)   PHYSICAL EXAM: General: Well appearing. Walked into clinic Cardiac: JVP ~8cm. S1 and S2 present. No murmurs or rub. Extremities: Warm and dry.  No peripheral edema.  Neuro: Alert and oriented x3. Affect pleasant. Moves all extremities without difficulty.  ECG (personally reviewed): AF RVR 137 bpm   ReDs reading: 38 %, abnormal  ASSESSMENT & PLAN: Chronic systolic CHF: - NICM. Tachy-mediated +/- ETOH.  - Echo (1/23): EF 20-25%, RV moderately reduced, biatrial enlargement, mild to moderate MR, moderate TR - TEE (1/23): EF 30-35%, RV mildly reduced, LAA thrombus, possible  - cMRI (1/23): LVEF 21% RV 25%. LAA thrombus. No scar.  - R/LHC (1/23): minimal CAD, well compensated filling pressures and normal output.  - Echo (5/23), EF improved 50-55% - NYHA I-II usually, now II-III. Volume improving since last visit, ReDs 40>38. Weight significantly down ~12lbs.  - Take torsemide  20 mg daily x1 dose tomorrow, then PRN. - Restart Farxiga  10 mg daily. Cost is a barrier, will give co-pay card.  - Continue spiro 12.5 mg daily. - Continue losartan  50 mg daily. - Restart Coreg  6.25 mg  bid, he is unsure why he wasn't taking just got confused about this medication.  - Will need echo when diuresed and back in SR. - BMET today   2. Atrial fibrillation  - Diagnosed 2/23. In setting of ETOH use.  - LAA thrombus noted on TEE, DCCV deferred. Spontaneously converted to NSR on amio. - Sleep study 5/23 negative for OSA, AHI 3.9/hr. - OSA negative; TSH normal; cut back ETOH significantly - Seen in clinic last month with AF RVR in 120s, asymptomatic - Remains in AF RVR today, feeling more run-down - Continue amiodarone  200 mg daily - Continue Eliquis  5 mg bid, has not missed any doses - Scheduled for DCCV with Dr. Bensimhon on Friday - Refer to EP for ablation eval given that he has significant symptoms and cardiomyopathy with AF.    3. ETOH abuse - Discussed importance of cessation.  - He has cut down more, now 2 beers/day. No bourbon.  4. H/o LAA Clot  - not seen on last Echo 5/23 - Continue Eliquis  as above   5. Stroke - MRI (1/23) new left MCA embolic - No deficits.  - Follows with Dr. Janett Medin. - Continue statin + Eliquis   DCCV Friday with Dr. Julane Ny  Follow up with APP in 2 weeks post- DCCV   [place] Allanah Mcfarland, NP 05/16/23

## 2023-05-15 NOTE — Progress Notes (Incomplete)
 ADVANCED HF CLINIC NOTE  Primary Care: Plotnikov, Oakley Bellman, MD HF Cardiologist: Dr. Julane Ny  Reason for Visit: Heart Failure Follow-up HPI: Eddie Dixon is a 58 y.o. male with CHF and Afib. Has hx of ETOH abuse. He established care with Dr. Katheryne Pane on 01/28/21 for Afib with RVR diagnosed at PCP's office the day prior. Noted increasing dyspnea with exertion for several months. He was started on Xarelto  and beta blocker.    Admitted 1/23 with a/c CHF and with AF with RVR. Beside echo with LVEF 20-25%. Initially added amiodarone  gtt but later stopped d/t concern for chemical conversion with nonadherence with anticoagulation. Formal echo showed EF 20-25%, RV moderately reduced, BAE, mild/mod MR, mod TR. TEE showed LAA thrombus so unable to proceed with DCCV. BP were soft with sub-optimal UOP, and AHF consulted d/t concern for low-output CHF and to assess for advanced therapies. Underwent R/LHC showing minimal CAD, well compensated filling pressures and normal output. 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 cMRI showed EF 21% RV 25%. LAA thrombus. No scar. Had RUE weakness, [facility] Stroke called. CTA head and neck normal, MRI with small acute infarcts and he was placed on Eliquis . GDMT titrated and he was discharged home, weight 222 lbs.   Hospital follow up, losartan  started and DC-CV arranged, but subsequently cancelled after spontaneously converting to NSR. Echo 5/23 showed EF 50-55%. Dig stopped with improved EF. Losartan  increased to 50 daily.  Sleep study 5/23 negative for OSA.  Last seen 10/2021.  He returns today for heart failure follow up. Overall feeling poor. NYHA II-III. Reports fatigue with occasional lightheadedness. Feeling more tired and worn out at work doing normal tasks, he works as a Psychologist, occupational. Denies chest pain, near-syncope, orthopnea, palpitations, and abnormal bleeding. Able to perform ADLs.  Appetite okay. Drinking 1-2 light beers/day, no liquor. Does not smoke. Chews tobacco. Compliant  with all medications.  Cardiac Studies: - Echo (5/23) EF 50-55%, mild LVH, grade II DD, normal RV. - Echo (1/23): EF 20-25%, RV moderately reduced, biatrial enlargement, mild to moderate MR, moderate TR - TEE (1/23): EF 30-35%, RV mildly reduced, LAA thrombus, possible  - cMRI (1/23): LVEF 21% RV 25%. LAA thrombus. No scar. NICM Tachy mediated + ETOH.  - R/LHC (1/23):   Prox RCA lesion is 20% stenosed.   The left ventricular ejection fraction is less than 25% by visual estimate. Ao = 96/73 (83) LV = 97/11 RA = 6 RV = 31/7 PA = 30/5 (22) PCW = 17 Fick cardiac output/index = 6.4/3.0 PVR = 0.80 WU SVR = 960 Ao sat = 95%  PA sat = 73%, 77%  SVC sat = 73%  1. Minimal nonobstructive CAD 2. Severe NICM EF ~20% 3. 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    Past Medical History:  Diagnosis Date   A-fib [facility])    Anxiety    ED (erectile dysfunction)    Elevated blood pressure    Current Outpatient Medications  Medication Sig Dispense Refill   amiodarone  (PACERONE ) 200 MG tablet Take 1 tablet (200 mg total) by mouth daily. 90 tablet 3   apixaban  (ELIQUIS ) 5 MG TABS tablet Take 1 tablet (5 mg total) by mouth 2 (two) times daily. 60 tablet 11   atorvastatin  (LIPITOR) 40 MG tablet TAKE 1 TABLET (40 MG TOTAL) BY MOUTH DAILY. NEEDS FOLLOW UP APPOINTMENT FOR MORE REFILLS 90 tablet 0   Cholecalciferol (VITAMIN D3) 50 MCG (2000 UT) capsule Take 1 capsule (2,000 Units total) by  mouth daily. 100 capsule 3   losartan  (COZAAR ) 50 MG tablet TAKE 1 TABLET BY MOUTH EVERY DAY 90 tablet 0   Multiple Vitamins-Minerals (MULTIVITAMIN ADULTS) TABS Take 1 tablet by mouth daily.     spironolactone  (ALDACTONE ) 25 MG tablet Take 0.5 tablets (12.5 mg total) by mouth daily. 45 tablet 3   torsemide  (DEMADEX ) 20 MG tablet Take 1 tablet (20 mg total) by mouth daily as needed (for weight gain > 3 lb in 24 hrs or 5 lb over dry weight). 30 tablet 3   amiodarone  (PACERONE )  200 MG tablet Take 200 mg bid for 2 weeks (Patient not taking: Reported on 05/16/2023) 28 tablet 0   carvedilol  (COREG ) 6.25 MG tablet Take 1 tablet (6.25 mg total) by mouth 2 (two) times daily with a meal. (Patient not taking: Reported on 05/16/2023) 60 tablet 3   dapagliflozin  propanediol (FARXIGA ) 10 MG TABS tablet Take 1 tablet (10 mg total) by mouth daily before breakfast. (Patient not taking: Reported on 05/16/2023) 30 tablet 11   meclizine  (ANTIVERT ) 25 MG tablet Take 1 tablet (25 mg total) by mouth 3 (three) times daily as needed for dizziness. (Patient not taking: Reported on 05/16/2023) 30 tablet 0   No current facility-administered medications for this encounter.   Allergies  Allergen Reactions   Bourbon [Alcohol]     Pt had a reaction to bourbon a couple of months ago: He had a small drink of bourbon and developed severe face flushing and pain in both legs with stiffness (thighs, shins) that lasted for several days.    Social History   Socioeconomic History   Marital status: Divorced    Spouse name: Not on file   Number of children: 2   Years of education: Not on file   Highest education level: Some college, no degree  Occupational History   Occupation: Merchandiser, retail    Comment: [employer]  Tobacco Use   Smoking status: Never   Smokeless tobacco: Current    Types: Chew  Vaping Use   Vaping status: Never Used  Substance and Sexual Activity   Alcohol use: Yes    Alcohol/week: 17.0 standard drinks of alcohol    Types: 17 Shots of liquor per week    Comment: drinks fifth of liquor per week x 1 year.   Drug use: No   Sexual activity: Yes  Other Topics Concern   Not on file  Social History Narrative   Regular Exercise -  YES         Social Drivers of Health   Financial Resource Strain: High Risk (02/15/2021)   Overall Financial Resource Strain (CARDIA)    Difficulty of Paying Living Expenses: Hard  Food Insecurity: No Food Insecurity (02/15/2021)   Hunger Vital Sign     Worried About Running Out of Food in the Last Year: Never true    Ran Out of Food in the Last Year: Never true  Transportation Needs: No Transportation Needs (02/15/2021)   PRAPARE - Administrator, Civil Service (Medical): No    Lack of Transportation (Non-Medical): No  Physical Activity: Not on file  Stress: Not on file  Social Connections: Not on file  Intimate Partner Violence: Not on file   Family History  Problem Relation Age of Onset   Heart disease Father 45       MI   BP (!) 140/92 (BP Location: Right Arm, Patient Position: Sitting, Cuff Size: Normal)   Pulse (!) 111   Ht  5\' 9"  (1.753 m)   Wt 101.9 kg (224 lb 9.6 oz)   SpO2 98%   BMI 33.17 kg/m   Wt Readings from Last 3 Encounters:  05/16/23 101.9 kg (224 lb 9.6 oz)  04/28/23 107.1 kg (236 lb 3.2 oz)  03/06/23 104.3 kg (230 lb)   PHYSICAL EXAM: General: Well appearing. Walked into clinic Cardiac: JVP ~8cm. S1 and S2 present. No murmurs or rub. Extremities: Warm and dry.  No peripheral edema.  Neuro: Alert and oriented x3. Affect pleasant. Moves all extremities without difficulty.  ECG (personally reviewed): AF RVR 137 bpm   ReDs reading: 38 %, abnormal  ASSESSMENT & PLAN: Chronic systolic CHF: - NICM. Tachy-mediated +/- ETOH.  - Echo (1/23): EF 20-25%, RV moderately reduced, biatrial enlargement, mild to moderate MR, moderate TR - TEE (1/23): EF 30-35%, RV mildly reduced, LAA thrombus, possible  - cMRI (1/23): LVEF 21% RV 25%. LAA thrombus. No scar.  - R/LHC (1/23): minimal CAD, well compensated filling pressures and normal output.  - Echo (5/23), EF improved 50-55% - NYHA I-II usually, now II-III. Volume improving since last visit, ReDs 40>38. Weight significantly down ~12lbs.  - Take torsemide  20 mg daily x1 dose tomorrow, then PRN. - Restart Farxiga  10 mg daily. Cost is a barrier, will give co-pay card.  - Continue spiro 12.5 mg daily. - Continue losartan  50 mg daily. - Restart Coreg  6.25 mg  bid, he is unsure why he wasn't taking just got confused about this medication.  - Will need echo when diuresed and back in SR. - BMET today   2. Atrial fibrillation  - Diagnosed 2/23. In setting of ETOH use.  - LAA thrombus noted on TEE, DCCV deferred. Spontaneously converted to NSR on amio. - Sleep study 5/23 negative for OSA, AHI 3.9/hr. - OSA negative; TSH normal; cut back ETOH significantly - Seen in clinic last month with AF RVR in 120s, asymptomatic - Remains in AF RVR today, feeling more run-down - Continue amiodarone  200 mg daily - Continue Eliquis  5 mg bid, has not missed any doses - Scheduled for DCCV with Dr. Bensimhon on Friday - Refer to EP for ablation eval given that he has significant symptoms and cardiomyopathy with AF.    3. ETOH abuse - Discussed importance of cessation.  - He has cut down more, now 2 beers/day. No bourbon.  4. H/o LAA Clot  - not seen on last Echo 5/23 - Continue Eliquis  as above   5. Stroke - MRI (1/23) new left MCA embolic - No deficits.  - Follows with Dr. Janett Medin. - Continue statin + Eliquis   DCCV Friday with Dr. Julane Ny  Follow up with APP in 2 weeks post- DCCV   [place] Hiliary Osorto, NP 05/16/23

## 2023-05-16 ENCOUNTER — Other Ambulatory Visit (HOSPITAL_COMMUNITY): Payer: Self-pay

## 2023-05-16 ENCOUNTER — Ambulatory Visit (HOSPITAL_COMMUNITY)
Admission: RE | Admit: 2023-05-16 | Discharge: 2023-05-16 | Disposition: A | Discharge status: Home / Self Care | Source: Ambulatory Visit | Attending: Cardiology | Admitting: Cardiology

## 2023-05-16 ENCOUNTER — Encounter (HOSPITAL_COMMUNITY): Payer: Self-pay

## 2023-05-16 VITALS — BP 140/92 | HR 111 | Ht 69.0 in | Wt 224.6 lb

## 2023-05-16 DIAGNOSIS — F1010 Alcohol abuse, uncomplicated: Secondary | ICD-10-CM | POA: Insufficient documentation

## 2023-05-16 DIAGNOSIS — I428 Other cardiomyopathies: Secondary | ICD-10-CM | POA: Diagnosis not present

## 2023-05-16 DIAGNOSIS — Z7901 Long term (current) use of anticoagulants: Secondary | ICD-10-CM | POA: Diagnosis not present

## 2023-05-16 DIAGNOSIS — F17220 Nicotine dependence, chewing tobacco, uncomplicated: Secondary | ICD-10-CM | POA: Insufficient documentation

## 2023-05-16 DIAGNOSIS — I4891 Unspecified atrial fibrillation: Secondary | ICD-10-CM

## 2023-05-16 DIAGNOSIS — Z79899 Other long term (current) drug therapy: Secondary | ICD-10-CM | POA: Insufficient documentation

## 2023-05-16 DIAGNOSIS — Z8673 Personal history of transient ischemic attack (TIA), and cerebral infarction without residual deficits: Secondary | ICD-10-CM | POA: Diagnosis not present

## 2023-05-16 DIAGNOSIS — I4892 Unspecified atrial flutter: Secondary | ICD-10-CM | POA: Diagnosis not present

## 2023-05-16 DIAGNOSIS — F1011 Alcohol abuse, in remission: Secondary | ICD-10-CM | POA: Diagnosis not present

## 2023-05-16 DIAGNOSIS — I5022 Chronic systolic (congestive) heart failure: Secondary | ICD-10-CM | POA: Insufficient documentation

## 2023-05-16 DIAGNOSIS — I5032 Chronic diastolic (congestive) heart failure: Secondary | ICD-10-CM | POA: Diagnosis not present

## 2023-05-16 DIAGNOSIS — I509 Heart failure, unspecified: Secondary | ICD-10-CM | POA: Diagnosis present

## 2023-05-16 LAB — BASIC METABOLIC PANEL WITH GFR
Anion gap: 13 (ref 5–15)
BUN: 20 mg/dL (ref 6–20)
CO2: 25 mmol/L (ref 22–32)
Calcium: 9.7 mg/dL (ref 8.9–10.3)
Chloride: 102 mmol/L (ref 98–111)
Creatinine, Ser: 1.59 mg/dL — ABNORMAL HIGH (ref 0.61–1.24)
GFR, Estimated: 51 mL/min — ABNORMAL LOW (ref >60)
Glucose, Bld: 68 mg/dL — ABNORMAL LOW (ref 70–99)
Potassium: 4 mmol/L (ref 3.5–5.1)
Sodium: 140 mmol/L (ref 135–145)

## 2023-05-16 LAB — CBC
HCT: 50.5 % (ref 39.0–52.0)
Hemoglobin: 16.6 g/dL (ref 13.0–17.0)
MCH: 30.6 pg (ref 26.0–34.0)
MCHC: 32.9 g/dL (ref 30.0–36.0)
MCV: 93 fL (ref 80.0–100.0)
Platelets: 167 10*3/uL (ref 150–400)
RBC: 5.43 MIL/uL (ref 4.22–5.81)
RDW: 12 % (ref 11.5–15.5)
WBC: 3.2 10*3/uL — ABNORMAL LOW (ref 4.0–10.5)
nRBC: 0 % (ref 0.0–0.2)

## 2023-05-16 MED ORDER — CARVEDILOL 6.25 MG PO TABS: 6.2500 mg | ORAL_TABLET | Freq: Two times a day (BID) | ORAL | 3 refills | Status: DC

## 2023-05-16 NOTE — Progress Notes (Signed)
 ReDS Vest / Clip - 05/16/23 0840       ReDS Vest / Clip   Station Marker C    Ruler Value 30    ReDS Value Range Moderate volume overload    ReDS Actual Value 38

## 2023-05-16 NOTE — Progress Notes (Signed)
 Orders placed for DCCV on Friday 4/25 with Dr. Bensimhon.

## 2023-05-16 NOTE — Patient Instructions (Addendum)
 Medication Changes:  RESTART CARVEDILOL  6.25MG  TWICE DAILY   TAKE 1 DOSE OF TORSEMIDE  TOMORROW MORNING  RESTART FARXIGA  AFTER YOUR CARDIOVERSION ON FRIDAY- COPAY CARD GIVEN  Lab Work:  Labs done today, your results will be available in MyChart, we will contact you for abnormal readings.  Testing/Procedures:  Your physician has recommended that you have a Cardioversion (DCCV). Electrical Cardioversion uses a jolt of electricity to your heart either through paddles or wired patches attached to your chest. This is a controlled, usually prescheduled, procedure. Defibrillation is done under light anesthesia in the hospital, and you usually go home the day of the procedure. This is done to get your heart back into a normal rhythm. You are not awake for the procedure. Please see the instruction sheet given to you today.  Referrals:  YOU HAVE BEEN REFERRED TO ELECTROPHYSIOLOGY THEY WILL REACH OUT TO YOU OR CALL TO ARRANGE THIS. PLEASE CALL US  WITH ANY CONCERNS    Special Instructions // Education:     Dear Eddie Dixon  You are scheduled for a Cardioversion on Friday, April 25 with Dr. Julane Ny.  Please arrive at the [facility] (Main Entrance A) at [facility]: [address] [place], [place] [address] at 11:00 AM (This time is 1 hour(s) before your procedure to ensure your preparation).   Free valet parking service is available. You will check in at ADMITTING.   *Please Note: You will receive a call the day before your procedure to confirm the appointment time. That time may have changed from the original time based on the schedule for that day.*   DIET:  Nothing to eat or drink after midnight except a sip of water with medications (see medication instructions below)  MEDICATION INSTRUCTIONS: !!IF ANY NEW MEDICATIONS ARE STARTED AFTER TODAY, PLEASE NOTIFY YOUR PROVIDER AS SOON AS POSSIBLE!!  FYI: Medications such as Semaglutide (Ozempic, [place]), Tirzepatide (Mounjaro,  Zepbound), Dulaglutide (Trulicity), etc ("GLP1 agonists") AND Canagliflozin (Invokana), Dapagliflozin  (Farxiga ), Empagliflozin  (Jardiance ), Ertugliflozin (Steglatro), Bexagliflozin [employer]) or any combination with one of these drugs such as Invokamet (Canagliflozin/Metformin), Synjardy (Empagliflozin /Metformin), etc ("SGLT2 inhibitors") must be held around the time of a procedure. This is not a comprehensive list of all of these drugs. Please review all of your medications and talk to your provider if you take any one of these. If you are not sure, ask your provider.   HOLD: Dapagliflozin  (Farxiga ) for 3 days prior to the procedure. Last dose on Monday, April 21.   DO NOT TAKE TORSEMIDE  THE MORNING OF PROCEDURE   DO NOT TAKE SPIRONOLACTONE  THE MORNING OF PROCEDURE   Continue taking your anticoagulant (blood thinner): Apixaban  (Eliquis ).   You will need to continue this after your procedure until you are told by your provider that it is safe to stop.    LABS: TODAY  FYI:  For your safety, and to allow us  to monitor your vital signs accurately during the surgery/procedure we request: If you have artificial nails, gel coating, SNS etc, please have those removed prior to your surgery/procedure. Not having the nail coverings /polish removed may result in cancellation or delay of your surgery/procedure.  Your support person will be asked to wait in the waiting room during your procedure.  It is OK to have someone drop you off and come back when you are ready to be discharged.  You cannot drive after the procedure and will need someone to drive you home.  Bring your insurance cards.  *Special Note: Every effort is  made to have your procedure done on time. Occasionally there are emergencies that occur at the hospital that may cause delays. Please be patient if a delay does occur.   Follow-Up in: 3 WEEKS AS SCHEDULED   At the [facility], you and your health needs are our  priority. We have a designated team specialized in the treatment of Heart Failure. This Care Team includes your primary Heart Failure Specialized Cardiologist (physician), Advanced Practice Providers (APPs- Physician Assistants and Nurse Practitioners), and Pharmacist who all work together to provide you with the care you need, when you need it.   You may see any of the following providers on your designated Care Team at your next follow up:  Dr. Jules Oar Dr. Peder Bourdon Dr. Alwin Baars Dr. Judyth Nunnery Nieves Bars, NP Ruddy Corral, [place] [facility] [place], [place] Dennise Fitz, NP [place] Lee, NP Luster Salters, PharmD   Please be sure to bring in all your medications bottles to every appointment.   Need to Contact Us :  If you have any questions or concerns before your next appointment please send us  a message through [place] or call our office at [phone].    TO LEAVE A MESSAGE FOR THE NURSE SELECT OPTION 2, PLEASE LEAVE A MESSAGE INCLUDING: YOUR NAME DATE OF BIRTH CALL BACK NUMBER REASON FOR CALL**this is important as we prioritize the call backs  YOU WILL RECEIVE A CALL BACK THE SAME DAY AS LONG AS YOU CALL BEFORE 4:00 PM

## 2023-05-18 ENCOUNTER — Institutional Professional Consult (permissible substitution) (INDEPENDENT_AMBULATORY_CARE_PROVIDER_SITE_OTHER): Payer: Managed Care, Other (non HMO)

## 2023-05-18 NOTE — Progress Notes (Signed)
 VM with no name. Left VM for patient to return call for appointment instructions. Did advise on message to arrive at 1030.

## 2023-05-19 ENCOUNTER — Other Ambulatory Visit: Payer: Self-pay

## 2023-05-19 ENCOUNTER — Encounter (HOSPITAL_COMMUNITY)
Admission: RE | Disposition: A | Discharge status: Home / Self Care | Payer: Self-pay | Source: Home / Self Care | Attending: Internal Medicine

## 2023-05-19 ENCOUNTER — Ambulatory Visit (HOSPITAL_COMMUNITY): Admitting: Anesthesiology

## 2023-05-19 ENCOUNTER — Ambulatory Visit (HOSPITAL_COMMUNITY)
Admission: RE | Admit: 2023-05-19 | Discharge: 2023-05-19 | Disposition: A | Discharge status: Home / Self Care | Attending: Internal Medicine | Admitting: Internal Medicine

## 2023-05-19 DIAGNOSIS — F1010 Alcohol abuse, uncomplicated: Secondary | ICD-10-CM | POA: Insufficient documentation

## 2023-05-19 DIAGNOSIS — Z7901 Long term (current) use of anticoagulants: Secondary | ICD-10-CM | POA: Diagnosis not present

## 2023-05-19 DIAGNOSIS — I4891 Unspecified atrial fibrillation: Secondary | ICD-10-CM

## 2023-05-19 DIAGNOSIS — I5022 Chronic systolic (congestive) heart failure: Secondary | ICD-10-CM | POA: Diagnosis not present

## 2023-05-19 DIAGNOSIS — I428 Other cardiomyopathies: Secondary | ICD-10-CM | POA: Insufficient documentation

## 2023-05-19 DIAGNOSIS — Z8673 Personal history of transient ischemic attack (TIA), and cerebral infarction without residual deficits: Secondary | ICD-10-CM | POA: Diagnosis not present

## 2023-05-19 DIAGNOSIS — E785 Hyperlipidemia, unspecified: Secondary | ICD-10-CM | POA: Diagnosis not present

## 2023-05-19 DIAGNOSIS — I5021 Acute systolic (congestive) heart failure: Secondary | ICD-10-CM

## 2023-05-19 DIAGNOSIS — Z79899 Other long term (current) drug therapy: Secondary | ICD-10-CM | POA: Diagnosis not present

## 2023-05-19 DIAGNOSIS — I110 Hypertensive heart disease with heart failure: Secondary | ICD-10-CM

## 2023-05-19 HISTORY — PX: CARDIOVERSION: EP1203

## 2023-05-19 SURGERY — CARDIOVERSION (CATH LAB)
Anesthesia: Monitor Anesthesia Care

## 2023-05-19 MED ORDER — LIDOCAINE 2% (20 MG/ML) 5 ML SYRINGE
INTRAMUSCULAR | Status: DC | PRN
  Administered 2023-05-19: 60 mg via INTRAVENOUS

## 2023-05-19 MED ORDER — PROPOFOL 10 MG/ML IV BOLUS
INTRAVENOUS | Status: DC | PRN
  Administered 2023-05-19: 10 mg via INTRAVENOUS
  Administered 2023-05-19: 60 mg via INTRAVENOUS

## 2023-05-19 MED ORDER — SODIUM CHLORIDE 0.9 % IV SOLN
INTRAVENOUS | Status: DC
Start: 2023-05-19 — End: 2023-05-19

## 2023-05-19 MED ORDER — PHENYLEPHRINE HCL (PRESSORS) 10 MG/ML IV SOLN
INTRAVENOUS | Status: DC | PRN
  Administered 2023-05-19 (×2): 80 ug via INTRAVENOUS

## 2023-05-19 SURGICAL SUPPLY — 1 items: PAD DEFIB RADIO PHYSIO CONN (PAD) ×1 IMPLANT

## 2023-05-19 NOTE — Transfer of Care (Signed)
 Immediate Anesthesia Transfer of Care Note  Patient: Eddie Dixon  Procedure(s) Performed: CARDIOVERSION  Patient Location: Cath Lab  Anesthesia Type:MAC  Level of Consciousness: drowsy and patient cooperative  Airway & Oxygen Therapy: Patient Spontanous Breathing  Post-op Assessment: Report given to RN and Post -op Vital signs reviewed and stable  Post vital signs: Reviewed and stable  Last Vitals:  Vitals Value Taken Time  BP 122/91 (100)   Temp    Pulse 59   Resp 18   SpO2 98     Last Pain:  Vitals:   05/19/23 1025  TempSrc: Temporal         Complications: No notable events documented.

## 2023-05-19 NOTE — CV Procedure (Signed)
    DIRECT CURRENT CARDIOVERSION  NAME:  Eddie Dixon   MRN: [id number] DOB:  1965-08-29   ADMIT DATE: 05/19/2023   INDICATIONS: Atrial fibrillation    PROCEDURE:   Informed consent was obtained prior to the procedure. The risks, benefits and alternatives for the procedure were discussed and the patient comprehended these risks. Once an appropriate time out was taken, the patient had the defibrillator pads placed in the anterior and posterior position. The patient then underwent sedation by the anesthesia service. Once an appropriate level of sedation was achieved, the patient received a  biphasic, synchronized 200J shock without conversion to sinus rhythm. We then increased to 225J and I held manual pressure on anterior pad while delivering a second shock andd there was prompt conversion to sinus rhythm. No apparent complications.  Jules Oar, MD  11:45 AM

## 2023-05-19 NOTE — Interval H&P Note (Signed)
 History and Physical Interval Note:  05/19/2023 11:39 AM  Eddie Dixon  has presented today for surgery, with the diagnosis of AFIB.  The various methods of treatment have been discussed with the patient and family. After consideration of risks, benefits and other options for treatment, the patient has consented to  Procedure(s): CARDIOVERSION (N/A) as a surgical intervention.  The patient's history has been reviewed, patient examined, no change in status, stable for surgery.  I have reviewed the patient's chart and labs.  Questions were answered to the patient's satisfaction.     Damico Partin

## 2023-05-19 NOTE — Anesthesia Preprocedure Evaluation (Signed)
 Anesthesia Evaluation  Patient identified by MRN, date of birth, ID band Patient awake    Reviewed: Allergy & Precautions, NPO status , Patient's Chart, lab work & pertinent test results  Airway Mallampati: III  TM Distance: >3 FB Neck ROM: Full    Dental no notable dental hx.    Pulmonary neg pulmonary ROS   Pulmonary exam normal        Cardiovascular hypertension, Pt. on medications and Pt. on home beta blockers  Rhythm:Irregular Rate:Tachycardia     Neuro/Psych   Anxiety     CVA    GI/Hepatic negative GI ROS,,,(+)     substance abuse    Endo/Other  negative endocrine ROS    Renal/GU Renal disease     Musculoskeletal negative musculoskeletal ROS (+)    Abdominal  (+) + obese  Peds  Hematology  (+) Blood dyscrasia (Eliquis )   Anesthesia Other Findings A-FIB with RVR  Reproductive/Obstetrics                              Anesthesia Physical Anesthesia Plan  ASA: 4  Anesthesia Plan: MAC   Post-op Pain Management:    Induction:   PONV Risk Score and Plan: 1 and Propofol  infusion and Treatment may vary due to age or medical condition  Airway Management Planned: Nasal Cannula  Additional Equipment:   Intra-op Plan:   Post-operative Plan:   Informed Consent: I have reviewed the patients History and Physical, chart, labs and discussed the procedure including the risks, benefits and alternatives for the proposed anesthesia with the patient or authorized representative who has indicated his/her understanding and acceptance.     Dental advisory given  Plan Discussed with: CRNA  Anesthesia Plan Comments:          Anesthesia Quick Evaluation

## 2023-05-20 ENCOUNTER — Encounter (HOSPITAL_COMMUNITY): Payer: Self-pay | Admitting: Internal Medicine

## 2023-05-22 NOTE — Anesthesia Postprocedure Evaluation (Signed)
 Anesthesia Post Note  Patient: Eddie Dixon  Procedure(s) Performed: CARDIOVERSION     Patient location during evaluation: Cath Lab Anesthesia Type: MAC Level of consciousness: awake Pain management: pain level controlled Vital Signs Assessment: post-procedure vital signs reviewed and stable Respiratory status: spontaneous breathing, nonlabored ventilation and respiratory function stable Cardiovascular status: blood pressure returned to baseline and stable Postop Assessment: no apparent nausea or vomiting Anesthetic complications: no   No notable events documented.  Last Vitals:  Vitals:   05/19/23 1210 05/19/23 1220  BP: (!) 129/91 100/70  Pulse: (!) 53 64  Resp: (!) 22 14  Temp:    SpO2: 100% 99%    Last Pain:  Vitals:   05/19/23 1210  TempSrc:   PainSc: 0-No pain                 Adrina Armijo P Ola Fawver

## 2023-05-23 ENCOUNTER — Encounter (HOSPITAL_COMMUNITY)

## 2023-05-25 ENCOUNTER — Ambulatory Visit: Payer: Managed Care, Other (non HMO) | Admitting: Adult Health

## 2023-05-31 ENCOUNTER — Ambulatory Visit (INDEPENDENT_AMBULATORY_CARE_PROVIDER_SITE_OTHER): Admitting: Adult Health

## 2023-05-31 ENCOUNTER — Encounter: Payer: Self-pay | Admitting: Adult Health

## 2023-05-31 VITALS — BP 122/81 | HR 54 | Ht 69.0 in | Wt 233.0 lb

## 2023-05-31 DIAGNOSIS — E559 Vitamin D deficiency, unspecified: Secondary | ICD-10-CM | POA: Diagnosis not present

## 2023-05-31 DIAGNOSIS — G4719 Other hypersomnia: Secondary | ICD-10-CM | POA: Diagnosis not present

## 2023-05-31 DIAGNOSIS — I480 Paroxysmal atrial fibrillation: Secondary | ICD-10-CM | POA: Diagnosis not present

## 2023-05-31 DIAGNOSIS — I6300 Cerebral infarction due to thrombosis of unspecified precerebral artery: Secondary | ICD-10-CM | POA: Diagnosis not present

## 2023-05-31 DIAGNOSIS — E538 Deficiency of other specified B group vitamins: Secondary | ICD-10-CM

## 2023-05-31 NOTE — Progress Notes (Signed)
 [facility] [address] [place]. [place] [address] [phone]       OFFICE FOLLOW-UP NOTE  Eddie Dixon Date of Birth:  08-May-1965 Medical Record Number:  [id number]    Primary neurologist: Dr. Janett Dixon Reason for visit: Stroke follow-up   Chief Complaint  Patient presents with   Cerebrovascular Accident    Rm 3 alone Pt is well and stable, reports no CVA concerns since last visit. Had recent cardioversion in April      HPI:   Update 05/31/2023 JM: Patient returns for follow-up visit.  Overall stable from neurological standpoint without new or reoccurring stroke/TIA symptoms.  Continues on Eliquis  and atorvastatin .  Routinely follows with PCP for stroke risk factor management.  Routinely follows with cardiology and underwent cardioversion 4/25.  He notes fatigue has improved some but still has a couple days where he feels overly fatigued.  He questions if this is due to his cardiac medications, currently on amiodarone , carvedilol , farxiga , losartan , spironolactone  and torsemide . He does have scheduled f/u visit with cardiology next week.  No further questions or concerns at this time.      History provided for reference purposes only Update 05/25/2022 Dr. Janett Dixon: He returns for follow-up after last visit 6 months ago.  He continues to do well.  He has not had recurrent stroke or TIA symptoms.  He remains on Eliquis  and is tolerating well with minor bruising but no bleeding or strokes.  Blood pressure under good control.  He is tolerating Lipitor well without any side effects.  He has no new complaints.   Update 11/24/2021 Dr. Janett Dixon: He returns for follow-up after last visit 6 months ago.  He is doing well.  He has not had any recurrent or new stroke or TIA symptoms.  He remains on Eliquis  which is tolerating well without bleeding or bruising.  His blood pressure is under good control and today it is 121/75.  He has some trouble with Lipitor due to muscle aches  and pains and has stopped it briefly for a few weeks upon advice from his primary care physician but is now back on it.  He has no new complaints.  Initial visit 05/24/2021 Dr. Janett Dixon: [name] is a 58 year old male seen today for initial office follow-up visit following hospital admission for stroke in January 2023.  History is obtained from the patient and review of electronic medical records and personally reviewed pertinent available imaging films in PACS.  He has past medical history of chronic atrial fibrillation on Xarelto , congestive heart failure who is admitted with A-fib with rapid ventricular rate, acute kidney injury and lower extremity edema.  He had shortly been started on Xarelto  prior to this but had not been taking it due to the cost.  He was in the hospital and had recently undergone a TEE which had shown left atrial appendage clot and hence was not cardioverted.  He was on IV heparin .  He developed sudden onset of right-sided weakness and slurred speech.  He was seen by neuro hospitalist on-call and had an NIH of 9.  He was not a candidate for TNK due to being on heparin  and no LVO was found on the CT angio which showed no large vessel stenosis or occlusion.  MRI scan of the brain showed high left parietal cortical as well as coronary radiator embolic infarcts.  2D echo had shown ejection fraction of 20-25% and TEE ejection fraction of 30/35% with positive bubble study.  Cardiac MRI on  02/19/2021 showed LAA thrombus present with mild biatrial enlargement and small possible PFO.  Moderate global hypokinesis with ejection fraction 21%.  LDL cholesterol was 93 mg percent hemoglobin A1c was 5.6.  Patient's weakness improved shortly thereafter during the hospitalization itself.  He was switched to Eliquis  and Lipitor was added.  Patient states is done well since discharge.  He is made full recovery.  He has had no recurrent stroke or TIA symptoms.  He is tolerating Eliquis  well without bleeding or  bruising.  Blood pressure is usually well controlled at home though today it is elevated in office at 170/95.  He remains on Lipitor 40 mg daily which is tolerating well without muscle aches and pains.  He has not had a follow-up lipid profile checked.  He has no new complaints today.  He did follow-up with his cardiologist Dr. Katheryne Dixon in February.      ROS:   14 system review of systems is positive for those listed in HPI and all other systems negative  PMH:  Past Medical History:  Diagnosis Date   A-fib (HCC)    Anxiety    ED (erectile dysfunction)    Elevated blood pressure     Social History:  Social History   Socioeconomic History   Marital status: Divorced    Spouse name: Not on file   Number of children: 2   Years of education: Not on file   Highest education level: Some college, no degree  Occupational History   Occupation: Merchandiser, retail    Comment: [employer]  Tobacco Use   Smoking status: Never   Smokeless tobacco: Current    Types: Chew  Vaping Use   Vaping status: Never Used  Substance and Sexual Activity   Alcohol use: Yes    Alcohol/week: 17.0 standard drinks of alcohol    Types: 17 Shots of liquor per week    Comment: drinks fifth of liquor per week x 1 year.   Drug use: No   Sexual activity: Yes  Other Topics Concern   Not on file  Social History Narrative   Regular Exercise -  YES         Social Drivers of Health   Financial Resource Strain: High Risk (02/15/2021)   Overall Financial Resource Strain (CARDIA)    Difficulty of Paying Living Expenses: Hard  Food Insecurity: No Food Insecurity (02/15/2021)   Hunger Vital Sign    Worried About Running Out of Food in the Last Year: Never true    Ran Out of Food in the Last Year: Never true  Transportation Needs: No Transportation Needs (02/15/2021)   PRAPARE - Administrator, Civil Service (Medical): No    Lack of Transportation (Non-Medical): No  Physical Activity: Not on file  Stress: Not  on file  Social Connections: Not on file  Intimate Partner Violence: Not on file    Medications:   Current Outpatient Medications on File Prior to Visit  Medication Sig Dispense Refill   amiodarone  (PACERONE ) 200 MG tablet Take 1 tablet (200 mg total) by mouth daily. 90 tablet 3   apixaban  (ELIQUIS ) 5 MG TABS tablet Take 1 tablet (5 mg total) by mouth 2 (two) times daily. 60 tablet 11   atorvastatin  (LIPITOR) 40 MG tablet TAKE 1 TABLET (40 MG TOTAL) BY MOUTH DAILY. NEEDS FOLLOW UP APPOINTMENT FOR MORE REFILLS 90 tablet 0   b complex vitamins capsule Take 1 capsule by mouth daily.     carvedilol  (COREG ) 6.25  MG tablet Take 1 tablet (6.25 mg total) by mouth 2 (two) times daily with a meal. 60 tablet 3   Cholecalciferol (VITAMIN D3) 50 MCG (2000 UT) capsule Take 1 capsule (2,000 Units total) by mouth daily. 100 capsule 3   dapagliflozin  propanediol (FARXIGA ) 10 MG TABS tablet Take 1 tablet (10 mg total) by mouth daily before breakfast. 30 tablet 11   losartan  (COZAAR ) 50 MG tablet TAKE 1 TABLET BY MOUTH EVERY DAY 90 tablet 0   magnesium oxide (MAG-OX) 400 (240 Mg) MG tablet Take 400 mg by mouth daily as needed (cramps).     Multiple Vitamins-Minerals (MULTIVITAMIN ADULTS) TABS Take 1 tablet by mouth daily.     spironolactone  (ALDACTONE ) 25 MG tablet Take 0.5 tablets (12.5 mg total) by mouth daily. 45 tablet 3   torsemide  (DEMADEX ) 20 MG tablet Take 1 tablet (20 mg total) by mouth daily as needed (for weight gain > 3 lb in 24 hrs or 5 lb over dry weight). 30 tablet 3   zinc gluconate 50 MG tablet Take 50 mg by mouth daily.     No current facility-administered medications on file prior to visit.    Allergies:   Allergies  Allergen Reactions   Bourbon [Alcohol]     Pt had a reaction to bourbon a couple of months ago: He had a small drink of bourbon and developed severe face flushing and pain in both legs with stiffness (thighs, shins) that lasted for several days.    Physical Exam Today's  Vitals   05/31/23 1538  BP: 122/81  Pulse: (!) 54  Weight: 233 lb (105.7 kg)  Height: 5\' 9"  (1.753 m)   Body mass index is 34.41 kg/m.  General: well developed, well nourished pleasant middle-age male, seated, in no evident distress Head: head normocephalic and atraumatic.  Neck: supple with no carotid or supraclavicular bruits Cardiovascular: regular rate and rhythm Musculoskeletal: no deformity Skin:  no rash/petichiae Vascular:  Normal pulses all extremities  Neurologic Exam Mental Status: Awake and fully alert.  Fluent speech and language oriented to place and time. Recent and remote memory intact. Attention span, concentration and fund of knowledge appropriate. Mood and affect appropriate.  Cranial Nerves: Pupils equal, briskly reactive to light. Extraocular movements full without nystagmus. Visual fields full to confrontation. Hearing intact. Facial sensation intact. Face, tongue, palate moves normally and symmetrically.  Motor: Normal bulk and tone. Normal strength in all tested extremity muscles. Sensory.: intact to touch ,pinprick .position and vibratory sensation.  Coordination: Rapid alternating movements normal in all extremities. Finger-to-nose and heel-to-shin performed accurately bilaterally. Gait and Station: Arises from chair without difficulty. Stance is normal. Gait demonstrates normal stride length and balance . Able to heel, toe and tandem walk without difficulty.  Reflexes: 1+ and symmetric. Toes downgoing.       ASSESSMENT: 58 year male with left pareital MCA branch infarct in January 2023 of cardioembolic etiology  from AFIB and cardiomyopathy.  He is doing well without recurrent symptoms and no residual deficits. Recently underwent cardioversion on 05/19/2023. Complains of continued occasional fatigue although did improve some post cardioversion.     PLAN:  1.  Left MCA stroke - Continue Eliquis  and atorvastatin  for secondary stroke prevention  manner/prescribed by PCP/cardiology -Continue routine cardiology follow-up for atrial fibrillation and Eliquis  management - Continue close PCP follow-up for aggressive stroke risk factor management  2.  Excessive daytime fatigue - Check lab work today such as TSH, B12 and vitamin D  to look for reversible  causes - Possibly medication side effect from multiple cardiac medications, he plans on further discussing with cardiology next week, will route them today's note as well  - Prior sleep study 05/2021 without evidence of sleep apnea, denies any weight changes since that time    No further recommendations from neurological standpoint and is closely being followed by PCP and cardiology.  He can follow-up on an as-needed basis.    I spent 25 minutes of face-to-face and non-face-to-face time with patient.  This included previsit chart review, lab review, study review, order entry, electronic health record documentation, patient education and discussion regarding above diagnoses and treatment plan and answered all other questions to patient's satisfaction  Eddie Dixon, [facility]  [facility] [address] [place], [place] [address]  Phone [phone] Fax [phone] Note: This document was prepared with digital dictation and possible smart phrase technology. Any transcriptional errors that result from this process are unintentional.

## 2023-05-31 NOTE — Patient Instructions (Addendum)
 Your Plan:  Continue Eliquis  and atorvastatin  for secondary stroke prevention and continue routine follow up with your PCP for stroke risk factor management   We will check lab work today to look for other causes of fatigue  Please follow up with cardiology to discuss possible medication side effects contributing        Thank you for coming to see us  at [facility]. I hope we have been able to provide you high quality care today.  You may receive a patient satisfaction survey over the next few weeks. We would appreciate your feedback and comments so that we may continue to improve ourselves and the health of our patients.

## 2023-06-01 ENCOUNTER — Encounter: Payer: Self-pay | Admitting: Adult Health

## 2023-06-01 LAB — TSH: TSH: 1.27 u[IU]/mL (ref 0.450–4.500)

## 2023-06-01 LAB — VITAMIN B12: Vitamin B-12: 929 pg/mL (ref 232–1245)

## 2023-06-01 LAB — VITAMIN D 25 HYDROXY (VIT D DEFICIENCY, FRACTURES): Vit D, 25-Hydroxy: 35.1 ng/mL (ref 30.0–100.0)

## 2023-06-05 ENCOUNTER — Telehealth (HOSPITAL_COMMUNITY): Payer: Self-pay

## 2023-06-05 NOTE — Progress Notes (Signed)
 ADVANCED HF CLINIC NOTE  Primary Care: Plotnikov, Oakley Bellman, MD HF Cardiologist: Dr. Julane Ny  Reason for Visit: Heart Failure Follow-up HPI: Eddie Dixon is a 58 y.o. male with CHF and Afib. Has hx of ETOH abuse. He established care with Dr. Katheryne Pane on 01/28/21 for Afib with RVR diagnosed at PCP's office the day prior. Noted increasing dyspnea with exertion for several months. He was started on Xarelto  and beta blocker.    Admitted 1/23 with a/c CHF and with AF with RVR. Beside echo with LVEF 20-25%. Initially added amiodarone  gtt but later stopped d/t concern for chemical conversion with nonadherence with anticoagulation. Formal echo showed EF 20-25%, RV moderately reduced, BAE, mild/mod MR, mod TR. TEE showed LAA thrombus so unable to proceed with DCCV. BP were soft with sub-optimal UOP, and AHF consulted d/t concern for low-output CHF and to assess for advanced therapies. Underwent R/LHC showing minimal CAD, well compensated filling pressures and normal output. 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 cMRI showed EF 21% RV 25%. LAA thrombus. No scar. Had RUE weakness, [facility] called. CTA head and neck normal, MRI with small acute infarcts and he was placed on Eliquis . GDMT titrated and he was discharged home, weight 222 lbs.   Hospital follow up, losartan  started and DC-CV arranged, but subsequently cancelled after spontaneously converting to NSR. Echo 5/23 showed EF 50-55%. Dig stopped with improved EF. Losartan  increased to 50 daily.  Sleep study 5/23 negative for OSA.  Last seen 10/2021.  He returns today for heart failure follow up. Overall feeling poor. NYHA II-III. Reports fatigue with occasional lightheadedness. Feeling more tired and worn out at work doing normal tasks, he works as a Psychologist, occupational. Denies chest pain, near-syncope, orthopnea, palpitations, and abnormal bleeding. Able to perform ADLs.  Appetite okay. Drinking 1-2 light beers/day, no liquor. Does not smoke. Chews tobacco. Compliant  with all medications.  Cardiac Studies: - Echo (5/23) EF 50-55%, mild LVH, grade II DD, normal RV. - Echo (1/23): EF 20-25%, RV moderately reduced, biatrial enlargement, mild to moderate MR, moderate TR - TEE (1/23): EF 30-35%, RV mildly reduced, LAA thrombus, possible  - cMRI (1/23): LVEF 21% RV 25%. LAA thrombus. No scar. NICM Tachy mediated + ETOH.  - R/LHC (1/23):   Prox RCA lesion is 20% stenosed.   The left ventricular ejection fraction is less than 25% by visual estimate. Ao = 96/73 (83) LV = 97/11 RA = 6 RV = 31/7 PA = 30/5 (22) PCW = 17 Fick cardiac output/index = 6.4/3.0 PVR = 0.80 WU SVR = 960 Ao sat = 95%  PA sat = 73%, 77%  SVC sat = 73%  1. Minimal nonobstructive CAD 2. Severe NICM EF ~20% 3. 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    Past Medical History:  Diagnosis Date   A-fib [facility]    Anxiety    ED (erectile dysfunction)    Elevated blood pressure    Current Outpatient Medications  Medication Sig Dispense Refill   amiodarone  (PACERONE ) 200 MG tablet Take 1 tablet (200 mg total) by mouth daily. 90 tablet 3   apixaban  (ELIQUIS ) 5 MG TABS tablet Take 1 tablet (5 mg total) by mouth 2 (two) times daily. 60 tablet 11   atorvastatin  (LIPITOR) 40 MG tablet TAKE 1 TABLET (40 MG TOTAL) BY MOUTH DAILY. NEEDS FOLLOW UP APPOINTMENT FOR MORE REFILLS 90 tablet 0   b complex vitamins capsule Take 1 capsule by mouth daily.     carvedilol  (  COREG ) 6.25 MG tablet Take 1 tablet (6.25 mg total) by mouth 2 (two) times daily with a meal. 60 tablet 3   Cholecalciferol (VITAMIN D3) 50 MCG (2000 UT) capsule Take 1 capsule (2,000 Units total) by mouth daily. 100 capsule 3   dapagliflozin  propanediol (FARXIGA ) 10 MG TABS tablet Take 1 tablet (10 mg total) by mouth daily before breakfast. 30 tablet 11   losartan  (COZAAR ) 50 MG tablet TAKE 1 TABLET BY MOUTH EVERY DAY 90 tablet 0   magnesium oxide (MAG-OX) 400 (240 Mg) MG tablet Take 400 mg by  mouth daily as needed (cramps).     Multiple Vitamins-Minerals (MULTIVITAMIN ADULTS) TABS Take 1 tablet by mouth daily.     spironolactone  (ALDACTONE ) 25 MG tablet Take 0.5 tablets (12.5 mg total) by mouth daily. 45 tablet 3   torsemide  (DEMADEX ) 20 MG tablet Take 1 tablet (20 mg total) by mouth daily as needed (for weight gain > 3 lb in 24 hrs or 5 lb over dry weight). 30 tablet 3   zinc gluconate 50 MG tablet Take 50 mg by mouth daily.     No current facility-administered medications for this visit.   Allergies  Allergen Reactions   Bourbon [Alcohol]     Pt had a reaction to bourbon a couple of months ago: He had a small drink of bourbon and developed severe face flushing and pain in both legs with stiffness (thighs, shins) that lasted for several days.    Social History   Socioeconomic History   Marital status: Divorced    Spouse name: Not on file   Number of children: 2   Years of education: Not on file   Highest education level: Some college, no degree  Occupational History   Occupation: Merchandiser, retail    Comment: [employer]  Tobacco Use   Smoking status: Never   Smokeless tobacco: Current    Types: Chew  Vaping Use   Vaping status: Never Used  Substance and Sexual Activity   Alcohol use: Yes    Alcohol/week: 17.0 standard drinks of alcohol    Types: 17 Shots of liquor per week    Comment: drinks fifth of liquor per week x 1 year.   Drug use: No   Sexual activity: Yes  Other Topics Concern   Not on file  Social History Narrative   Regular Exercise -  YES         Social Drivers of Health   Financial Resource Strain: High Risk (02/15/2021)   Overall Financial Resource Strain (CARDIA)    Difficulty of Paying Living Expenses: Hard  Food Insecurity: No Food Insecurity (02/15/2021)   Hunger Vital Sign    Worried About Running Out of Food in the Last Year: Never true    Ran Out of Food in the Last Year: Never true  Transportation Needs: No Transportation Needs (02/15/2021)    PRAPARE - Administrator, Civil Service (Medical): No    Lack of Transportation (Non-Medical): No  Physical Activity: Not on file  Stress: Not on file  Social Connections: Not on file  Intimate Partner Violence: Not on file   Family History  Problem Relation Age of Onset   Heart disease Father 27       MI   There were no vitals taken for this visit.  Wt Readings from Last 3 Encounters:  05/31/23 105.7 kg (233 lb)  05/16/23 101.9 kg (224 lb 9.6 oz)  04/28/23 107.1 kg (236 lb 3.2 oz)  PHYSICAL EXAM: General: Well appearing. Walked into clinic Cardiac: JVP ~8cm. S1 and S2 present. No murmurs or rub. Extremities: Warm and dry.  No peripheral edema.  Neuro: Alert and oriented x3. Affect pleasant. Moves all extremities without difficulty.  ECG (personally reviewed): AF RVR 137 bpm   ReDs reading: 38 %, abnormal  ASSESSMENT & PLAN: Chronic systolic CHF: - NICM. Tachy-mediated +/- ETOH.  - Echo (1/23): EF 20-25%, RV moderately reduced, biatrial enlargement, mild to moderate MR, moderate TR - TEE (1/23): EF 30-35%, RV mildly reduced, LAA thrombus, possible  - cMRI (1/23): LVEF 21% RV 25%. LAA thrombus. No scar.  - R/LHC (1/23): minimal CAD, well compensated filling pressures and normal output.  - Echo (5/23), EF improved 50-55% - NYHA I-II usually, now II-III. Volume improving since last visit, ReDs 40>38. Weight significantly down ~12lbs.  - Take torsemide  20 mg daily x1 dose tomorrow, then PRN. - Restart Farxiga  10 mg daily. Cost is a barrier, will give co-pay card.  - Continue spiro 12.5 mg daily. - Continue losartan  50 mg daily. - Restart Coreg  6.25 mg bid, he is unsure why he wasn't taking just got confused about this medication.  - Will need echo when diuresed and back in SR. - BMET today   2. Atrial fibrillation  - Diagnosed 2/23. In setting of ETOH use.  - LAA thrombus noted on TEE, DCCV deferred. Spontaneously converted to NSR on amio. - Sleep study  5/23 negative for OSA, AHI 3.9/hr. - OSA negative; TSH normal; cut back ETOH significantly - Seen in clinic last month with AF RVR in 120s, asymptomatic - Remains in AF RVR today, feeling more run-down - Continue amiodarone  200 mg daily - Continue Eliquis  5 mg bid, has not missed any doses - Scheduled for DCCV with Dr. Bensimhon on Friday - Refer to EP for ablation eval given that he has significant symptoms and cardiomyopathy with AF.    3. ETOH abuse - Discussed importance of cessation.  - He has cut down more, now 2 beers/day. No bourbon.  4. H/o LAA Clot  - not seen on last Echo 5/23 - Continue Eliquis  as above   5. Stroke - MRI (1/23) new left MCA embolic - No deficits.  - Follows with Dr. Janett Medin. - Continue statin + Eliquis   DCCV Friday with Dr. Julane Ny  Follow up with APP in 2 weeks post- DCCV   Eddie Dixon, [place] 06/05/23

## 2023-06-05 NOTE — Telephone Encounter (Signed)
 Called to confirm/remind patient of their appointment at the [facility] on 06/06/2023 1530.   Appointment:    [x] Left mess    Patient reminded to bring all medications and/or complete list.  Confirmed patient has transportation. Gave directions, instructed to utilize valet parking.

## 2023-06-06 ENCOUNTER — Telehealth (HOSPITAL_COMMUNITY): Payer: Self-pay

## 2023-06-06 ENCOUNTER — Other Ambulatory Visit (HOSPITAL_COMMUNITY): Payer: Self-pay

## 2023-06-06 ENCOUNTER — Ambulatory Visit (HOSPITAL_COMMUNITY)
Admission: RE | Admit: 2023-06-06 | Discharge: 2023-06-06 | Disposition: A | Discharge status: Home / Self Care | Source: Ambulatory Visit | Attending: Family Medicine | Admitting: Family Medicine

## 2023-06-06 ENCOUNTER — Encounter (HOSPITAL_COMMUNITY): Payer: Self-pay

## 2023-06-06 VITALS — BP 142/86 | HR 51 | Wt 236.2 lb

## 2023-06-06 DIAGNOSIS — I480 Paroxysmal atrial fibrillation: Secondary | ICD-10-CM | POA: Diagnosis not present

## 2023-06-06 DIAGNOSIS — I513 Intracardiac thrombosis, not elsewhere classified: Secondary | ICD-10-CM | POA: Diagnosis not present

## 2023-06-06 DIAGNOSIS — Z7984 Long term (current) use of oral hypoglycemic drugs: Secondary | ICD-10-CM | POA: Insufficient documentation

## 2023-06-06 DIAGNOSIS — Z5986 Financial insecurity: Secondary | ICD-10-CM | POA: Insufficient documentation

## 2023-06-06 DIAGNOSIS — I63419 Cerebral infarction due to embolism of unspecified middle cerebral artery: Secondary | ICD-10-CM

## 2023-06-06 DIAGNOSIS — I10 Essential (primary) hypertension: Secondary | ICD-10-CM

## 2023-06-06 DIAGNOSIS — F1010 Alcohol abuse, uncomplicated: Secondary | ICD-10-CM | POA: Insufficient documentation

## 2023-06-06 DIAGNOSIS — R5383 Other fatigue: Secondary | ICD-10-CM | POA: Diagnosis not present

## 2023-06-06 DIAGNOSIS — R000 Tachycardia, unspecified: Secondary | ICD-10-CM | POA: Diagnosis not present

## 2023-06-06 DIAGNOSIS — Z8673 Personal history of transient ischemic attack (TIA), and cerebral infarction without residual deficits: Secondary | ICD-10-CM | POA: Insufficient documentation

## 2023-06-06 DIAGNOSIS — I110 Hypertensive heart disease with heart failure: Secondary | ICD-10-CM | POA: Diagnosis present

## 2023-06-06 DIAGNOSIS — I4891 Unspecified atrial fibrillation: Secondary | ICD-10-CM | POA: Diagnosis not present

## 2023-06-06 DIAGNOSIS — R001 Bradycardia, unspecified: Secondary | ICD-10-CM | POA: Insufficient documentation

## 2023-06-06 DIAGNOSIS — F1090 Alcohol use, unspecified, uncomplicated: Secondary | ICD-10-CM | POA: Diagnosis not present

## 2023-06-06 DIAGNOSIS — I428 Other cardiomyopathies: Secondary | ICD-10-CM | POA: Insufficient documentation

## 2023-06-06 DIAGNOSIS — Z7901 Long term (current) use of anticoagulants: Secondary | ICD-10-CM | POA: Insufficient documentation

## 2023-06-06 DIAGNOSIS — Z720 Tobacco use: Secondary | ICD-10-CM | POA: Insufficient documentation

## 2023-06-06 DIAGNOSIS — I2510 Atherosclerotic heart disease of native coronary artery without angina pectoris: Secondary | ICD-10-CM | POA: Diagnosis not present

## 2023-06-06 DIAGNOSIS — I5022 Chronic systolic (congestive) heart failure: Secondary | ICD-10-CM | POA: Diagnosis not present

## 2023-06-06 DIAGNOSIS — R0609 Other forms of dyspnea: Secondary | ICD-10-CM | POA: Insufficient documentation

## 2023-06-06 DIAGNOSIS — Z79899 Other long term (current) drug therapy: Secondary | ICD-10-CM | POA: Insufficient documentation

## 2023-06-06 LAB — IRON AND TIBC
Iron: 55 ug/dL (ref 45–182)
Saturation Ratios: 16 % — ABNORMAL LOW (ref 17.9–39.5)
TIBC: 335 ug/dL (ref 250–450)
UIBC: 280 ug/dL

## 2023-06-06 LAB — CBC
HCT: 39.7 % (ref 39.0–52.0)
Hemoglobin: 13.6 g/dL (ref 13.0–17.0)
MCH: 31.4 pg (ref 26.0–34.0)
MCHC: 34.3 g/dL (ref 30.0–36.0)
MCV: 91.7 fL (ref 80.0–100.0)
Platelets: 194 10*3/uL (ref 150–400)
RBC: 4.33 MIL/uL (ref 4.22–5.81)
RDW: 12.1 % (ref 11.5–15.5)
WBC: 4.6 10*3/uL (ref 4.0–10.5)
nRBC: 0 % (ref 0.0–0.2)

## 2023-06-06 LAB — BASIC METABOLIC PANEL WITH GFR
Anion gap: 5 (ref 5–15)
BUN: 13 mg/dL (ref 6–20)
CO2: 29 mmol/L (ref 22–32)
Calcium: 9.8 mg/dL (ref 8.9–10.3)
Chloride: 108 mmol/L (ref 98–111)
Creatinine, Ser: 1.1 mg/dL (ref 0.61–1.24)
GFR, Estimated: >60 mL/min (ref >60)
Glucose, Bld: 90 mg/dL (ref 70–99)
Potassium: 4.6 mmol/L (ref 3.5–5.1)
Sodium: 142 mmol/L (ref 135–145)

## 2023-06-06 LAB — FERRITIN: Ferritin: 157 ng/mL (ref 24–336)

## 2023-06-06 LAB — BRAIN NATRIURETIC PEPTIDE: B Natriuretic Peptide: 102.8 pg/mL — ABNORMAL HIGH (ref 0.0–100.0)

## 2023-06-06 MED ORDER — SPIRONOLACTONE 25 MG PO TABS: 12.5000 mg | ORAL_TABLET | Freq: Every day | ORAL | 3 refills | Status: DC

## 2023-06-06 MED ORDER — APIXABAN 5 MG PO TABS: 5.0000 mg | ORAL_TABLET | Freq: Two times a day (BID) | ORAL | 11 refills | Status: DC

## 2023-06-06 MED ORDER — AMIODARONE HCL 200 MG PO TABS: 200.0000 mg | ORAL_TABLET | Freq: Every day | ORAL | 3 refills | Status: DC

## 2023-06-06 MED ORDER — TORSEMIDE 20 MG PO TABS: 20.0000 mg | ORAL_TABLET | Freq: Every day | ORAL | 2 refills | Status: AC | PRN

## 2023-06-06 MED ORDER — CARVEDILOL 3.125 MG PO TABS: 6.2500 mg | ORAL_TABLET | Freq: Two times a day (BID) | ORAL | 5 refills | Status: DC

## 2023-06-06 MED ORDER — LOSARTAN POTASSIUM 50 MG PO TABS: 50.0000 mg | ORAL_TABLET | Freq: Every day | ORAL | 0 refills | Status: DC

## 2023-06-06 MED ORDER — ATORVASTATIN CALCIUM 40 MG PO TABS: 40.0000 mg | ORAL_TABLET | Freq: Every day | ORAL | 0 refills | Status: DC

## 2023-06-06 NOTE — Patient Instructions (Addendum)
 Good to see you today!  DECREASE Coreg  to 3.125 mg Twice daily  START Farxiga  10 mg daily  Your physician has requested that you have an echocardiogram. Echocardiography is a painless test that uses sound waves to create images of your heart. It provides your doctor with information about the size and shape of your heart and how well your heart's chambers and valves are working. This procedure takes approximately one hour. There are no restrictions for this procedure. Please do NOT wear cologne, perfume, aftershave, or lotions (deodorant is allowed). Please arrive 15 minutes prior to your appointment time.  Please note: We ask at that you not bring children with you during ultrasound (echo/ vascular) testing. Due to room size and safety concerns, children are not allowed in the ultrasound rooms during exams. Our front office staff cannot provide observation of children in our lobby area while testing is being conducted. An adult accompanying a patient to their appointment will only be allowed in the ultrasound room at the discretion of the ultrasound technician under special circumstances. We apologize for any inconvenience.  Labs done today, your results will be available in MyChart, we will contact you for abnormal readings.  Check blood pressure daily and notify clinic if b/p consistently greater than 140( Goal is 120/80 or less) 4 months(September) Call office  in July to schedule an appointment Your physician recommends that you schedule a follow-up appointment :  If you have any questions or concerns before your next appointment please send us  a message through [facility] or call our office at [phone].    TO LEAVE A MESSAGE FOR THE NURSE SELECT OPTION 2, PLEASE LEAVE A MESSAGE INCLUDING: YOUR NAME DATE OF BIRTH CALL BACK NUMBER REASON FOR CALL**this is important as we prioritize the call backs  YOU WILL RECEIVE A CALL BACK THE SAME DAY AS LONG AS YOU CALL BEFORE 4:00 PM At the  [facility], you and your health needs are our priority. As part of our continuing mission to provide you with exceptional heart care, we have created designated Provider Care Teams. These Care Teams include your primary Cardiologist (physician) and Advanced Practice Providers (APPs- Physician Assistants and Nurse Practitioners) who all work together to provide you with the care you need, when you need it.   You may see any of the following providers on your designated Care Team at your next follow up: Dr Jules Oar Dr Peder Bourdon Dr. Alwin Baars Dr. Arta Lark Amy Marijane Shoulders, NP Ruddy Corral, [place] [facility] Leonard, [place] Dennise Fitz, NP [place] Lee, NP Shawnee Dellen, NP Luster Salters, PharmD Bevely Brush, PharmD   Please be sure to bring in all your medications bottles to every appointment.    Thank you for choosing [facility]

## 2023-06-06 NOTE — Telephone Encounter (Signed)
 Advanced Heart Failure Patient Advocate Encounter  Test billing for this patients current coverage shows that Farxiga  and Jardiance  both have copays of $25 for 30 days or $75 for 90 days.  This patient is eligible to use copay savings cards with this plan. Savings cards would cover the cost of Farxiga  to $0 anJardiance  to $10 per 30 days.  Kennis Peacock, CPhT Rx Patient Advocate Phone: [phone]

## 2023-06-06 NOTE — Progress Notes (Signed)
 ReDS Vest / Clip - 06/06/23 1600       ReDS Vest / Clip   Station Marker D    Ruler Value 36    ReDS Value Range Low volume    ReDS Actual Value 32

## 2023-06-07 ENCOUNTER — Ambulatory Visit (HOSPITAL_COMMUNITY): Payer: Self-pay | Admitting: Family Medicine

## 2023-07-18 ENCOUNTER — Other Ambulatory Visit (HOSPITAL_COMMUNITY): Payer: Self-pay | Admitting: *Deleted

## 2023-07-18 DIAGNOSIS — I5022 Chronic systolic (congestive) heart failure: Secondary | ICD-10-CM

## 2023-07-19 ENCOUNTER — Ambulatory Visit (HOSPITAL_COMMUNITY)
Admission: RE | Admit: 2023-07-19 | Discharge: 2023-07-19 | Disposition: A | Discharge status: Home / Self Care | Source: Ambulatory Visit | Attending: Internal Medicine | Admitting: Internal Medicine

## 2023-07-19 DIAGNOSIS — I5022 Chronic systolic (congestive) heart failure: Secondary | ICD-10-CM | POA: Insufficient documentation

## 2023-07-19 DIAGNOSIS — I080 Rheumatic disorders of both mitral and aortic valves: Secondary | ICD-10-CM | POA: Insufficient documentation

## 2023-07-19 DIAGNOSIS — I4891 Unspecified atrial fibrillation: Secondary | ICD-10-CM | POA: Insufficient documentation

## 2023-07-19 LAB — ECHOCARDIOGRAM COMPLETE
AR max vel: 2.46 cm2
AV Area VTI: 2.76 cm2
AV Area mean vel: 2.27 cm2
AV Mean grad: 5 mmHg
AV Peak grad: 8.3 mmHg
Ao pk vel: 1.44 m/s
Area-P 1/2: 2.66 cm2
S' Lateral: 3.5 cm

## 2023-07-21 ENCOUNTER — Ambulatory Visit (HOSPITAL_COMMUNITY): Payer: Self-pay | Admitting: Family Medicine

## 2023-07-27 NOTE — Telephone Encounter (Signed)
 Pt aware, agreeable, and verbalized understanding

## 2023-08-02 ENCOUNTER — Telehealth (HOSPITAL_COMMUNITY): Payer: Self-pay | Admitting: Vascular Surgery

## 2023-08-02 NOTE — Telephone Encounter (Signed)
 Pt need refills on atorvastatin  and Losartan 

## 2023-08-03 MED ORDER — LOSARTAN POTASSIUM 50 MG PO TABS: 50.0000 mg | ORAL_TABLET | Freq: Every day | ORAL | 3 refills | Status: DC

## 2023-08-03 MED ORDER — ATORVASTATIN CALCIUM 40 MG PO TABS: 40.0000 mg | ORAL_TABLET | Freq: Every day | ORAL | 3 refills | Status: AC

## 2023-10-06 ENCOUNTER — Telehealth (HOSPITAL_COMMUNITY): Payer: Self-pay

## 2023-10-06 NOTE — Telephone Encounter (Addendum)
 Called to confirm/remind patient of their appointment at the [facility] on 10/09/23 3:30.   Appointment:   [x] Confirmed  [] Left mess   [] No answer/No voice mail  [] VM Full/unable to leave message  [] Phone not in service  Patient reminded to bring all medications and/or complete list.  Confirmed patient has transportation. Gave directions, instructed to utilize valet parking.

## 2023-10-09 ENCOUNTER — Encounter (HOSPITAL_COMMUNITY): Payer: Self-pay

## 2023-10-09 ENCOUNTER — Ambulatory Visit (HOSPITAL_COMMUNITY)
Admission: RE | Admit: 2023-10-09 | Discharge: 2023-10-09 | Disposition: A | Discharge status: Home / Self Care | Source: Ambulatory Visit | Attending: Cardiology

## 2023-10-09 VITALS — BP 124/84 | HR 56 | Ht 69.0 in | Wt 236.0 lb

## 2023-10-09 DIAGNOSIS — I480 Paroxysmal atrial fibrillation: Secondary | ICD-10-CM | POA: Diagnosis not present

## 2023-10-09 DIAGNOSIS — F17220 Nicotine dependence, chewing tobacco, uncomplicated: Secondary | ICD-10-CM | POA: Diagnosis not present

## 2023-10-09 DIAGNOSIS — I639 Cerebral infarction, unspecified: Secondary | ICD-10-CM

## 2023-10-09 DIAGNOSIS — Z7901 Long term (current) use of anticoagulants: Secondary | ICD-10-CM | POA: Insufficient documentation

## 2023-10-09 DIAGNOSIS — I428 Other cardiomyopathies: Secondary | ICD-10-CM | POA: Insufficient documentation

## 2023-10-09 DIAGNOSIS — I5032 Chronic diastolic (congestive) heart failure: Secondary | ICD-10-CM | POA: Diagnosis present

## 2023-10-09 DIAGNOSIS — Z79899 Other long term (current) drug therapy: Secondary | ICD-10-CM | POA: Insufficient documentation

## 2023-10-09 DIAGNOSIS — F1010 Alcohol abuse, uncomplicated: Secondary | ICD-10-CM | POA: Insufficient documentation

## 2023-10-09 DIAGNOSIS — Z8673 Personal history of transient ischemic attack (TIA), and cerebral infarction without residual deficits: Secondary | ICD-10-CM | POA: Diagnosis not present

## 2023-10-09 DIAGNOSIS — I10 Essential (primary) hypertension: Secondary | ICD-10-CM

## 2023-10-09 DIAGNOSIS — I5022 Chronic systolic (congestive) heart failure: Secondary | ICD-10-CM

## 2023-10-09 DIAGNOSIS — I110 Hypertensive heart disease with heart failure: Secondary | ICD-10-CM | POA: Diagnosis not present

## 2023-10-09 DIAGNOSIS — F1011 Alcohol abuse, in remission: Secondary | ICD-10-CM

## 2023-10-09 DIAGNOSIS — I7300 Raynaud's syndrome without gangrene: Secondary | ICD-10-CM | POA: Insufficient documentation

## 2023-10-09 LAB — BASIC METABOLIC PANEL WITH GFR
Anion gap: 10 (ref 5–15)
BUN: 14 mg/dL (ref 6–20)
CO2: 23 mmol/L (ref 22–32)
Calcium: 9.4 mg/dL (ref 8.9–10.3)
Chloride: 107 mmol/L (ref 98–111)
Creatinine, Ser: 1.29 mg/dL — ABNORMAL HIGH (ref 0.61–1.24)
GFR, Estimated: >60 mL/min (ref >60)
Glucose, Bld: 89 mg/dL (ref 70–99)
Potassium: 4.4 mmol/L (ref 3.5–5.1)
Sodium: 140 mmol/L (ref 135–145)

## 2023-10-09 LAB — T4, FREE: Free T4: 1.03 ng/dL (ref 0.61–1.12)

## 2023-10-09 LAB — TSH: TSH: 1.416 u[IU]/mL (ref 0.350–4.500)

## 2023-10-09 MED ORDER — AMLODIPINE BESYLATE 5 MG PO TABS: 5.0000 mg | ORAL_TABLET | Freq: Every day | ORAL | 5 refills | Status: DC

## 2023-10-09 NOTE — Patient Instructions (Addendum)
 Medication Changes:  STOP CARVEDILOL    START AMLODIPINE  5MG  ONCE DAILY   Lab Work:  Labs done today, your results will be available in MyChart, we will contact you for abnormal readings.  Testing/Procedures:  Your provider has recommended that you have a home sleep study (Itamar Test).  We have provided you with the equipment in our office today. Please go ahead and download the app. DO NOT OPEN OR TAMPER WITH THE BOX UNTIL WE ADVISE YOU TO DO SO. Once insurance has approved the test our office will call you with PIN number and approval to proceed with testing. Once you have completed the test you just dispose of the equipment, the information is automatically uploaded to us  via blue-tooth technology. If your test is positive for sleep apnea and you need a home CPAP machine you will be contacted by Dr Dorine office [facility]) to set this up.  Follow-Up in: 4 MONTHS WITH DR. CHERRIE PLEASE CALL OUR OFFICE AROUND NOVEMBER TO GET SCHEDULED FOR YOUR JANUARY APPOINTMENT. PHONE NUMBER IS [phone] OPTION 2 R  At the [facility], you and your health needs are our priority. We have a designated team specialized in the treatment of Heart Failure. This Care Team includes your primary Heart Failure Specialized Cardiologist (physician), Advanced Practice Providers (APPs- Physician Assistants and Nurse Practitioners), and Pharmacist who all work together to provide you with the care you need, when you need it.   You may see any of the following providers on your designated Care Team at your next follow up:  Dr. Toribio CHERRIE Dr. Ezra Shuck Dr. Ria Commander Dr. Odis Brownie Greig Mosses, NP Caffie Shed, [place] [facility] [place], [place] Beckey Coe, NP [place] Lee, NP Tinnie Redman, PharmD   Please be sure to bring in all your medications bottles to every appointment.   Need to Contact Us :  If you have any questions or concerns before your next  appointment please send us  a message through [place] or call our office at [phone].    TO LEAVE A MESSAGE FOR THE NURSE SELECT OPTION 2, PLEASE LEAVE A MESSAGE INCLUDING: YOUR NAME DATE OF BIRTH CALL BACK NUMBER REASON FOR CALL**this is important as we prioritize the call backs  YOU WILL RECEIVE A CALL BACK THE SAME DAY AS LONG AS YOU CALL BEFORE 4:00 PM

## 2023-10-09 NOTE — Progress Notes (Signed)
 ADVANCED HF CLINIC NOTE  Primary Care: Plotnikov, Karlynn GAILS, MD HF Cardiologist: Dr. Cherrie  HPI: Eddie Dixon is a 58 y.o. male with history of HFrEF now HFpEF, Afib, and CVA. Has hx of ETOH abuse. He established care with Dr. Court on 01/28/21 for Afib with RVR diagnosed at PCP's office the day prior.    Admitted 1/23 with a/c CHF and with AF with RVR. Beside echo with LVEF 20-25%. Initially added amiodarone  gtt but later stopped d/t concern for chemical conversion with nonadherence with anticoagulation. Formal echo showed EF 20-25%, RV moderately reduced, BAE, mild/mod MR, mod TR. TEE showed LAA thrombus so unable to proceed with DCCV. BP were soft with sub-optimal UOP, and AHF consulted d/t concern for low-output CHF and to assess for advanced therapies. Underwent R/LHC showing minimal CAD, well compensated filling pressures and normal output. 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 cMRI showed EF 21% RV 25%. LAA thrombus. No scar. Had RUE weakness, [facility] Stroke called. CTA head and neck normal, MRI with small acute infarcts and he was placed on Eliquis . GDMT titrated and he was discharged home, weight 222 lbs.   Hospital follow up, losartan  started and DC-CV arranged, but subsequently cancelled after spontaneously converting to NSR. Echo 5/23 showed EF 50-55%. Dig stopped with improved EF. Losartan  increased to 50 daily.  Sleep study 5/23 negative for OSA.  Follow up 4/25, in AF and off several meds, including Eliquis . Started on amio, restarted AC. Underwent DCCV to NSR 05/19/23  He returns today for heart failure follow up. Overall feeling okay, biggest compliant is fatigue. States that he is getting enough sleep, however wakes up exhausted. He is also having trouble with lightheadedness and dizziness in the mornings. Had to call out of work one day this month due to not enough energy, which is not like him. Works as a Psychologist, occupational.  Reports that he is also having issue with focusing his eye, wears  glasses and has not had a recent prescription changed or exam. Also with symptoms of fingers burning, changing colors, and tingling when cold. NYHA II. Denies chest pain, dyspnea, palpitations, dizziness, and abnormal bleeding. Able to perform ADLs. Appetite okay. SBP at home runs ~150s. Reports medication compliance, however dispense report is extremely spotty. Does not appear that he has ever picked up Farxiga .   Cardiac Studies: - Echo (6/25): EF 60-65%, G1DD, nl RV, triv MR - Echo (5/23) EF 50-55%, mild LVH, grade II DD, normal RV. - Echo (1/23): EF 20-25%, RV moderately reduced, biatrial enlargement, mild to moderate MR, moderate TR - TEE (1/23): EF 30-35%, RV mildly reduced, LAA thrombus, possible  - cMRI (1/23): LVEF 21% RV 25%. LAA thrombus. No scar. NICM Tachy mediated + ETOH.  - R/LHC (1/23): RA 6, PA 30/5 (22), PCW 17, CO.CI fick 6.4/3.0, PVR 0.8. Minimal nonobstructive CAD, severe NICM EF ~20%  Past Medical History:  Diagnosis Date   A-fib [facility])    Anxiety    ED (erectile dysfunction)    Elevated blood pressure    Current Outpatient Medications  Medication Sig Dispense Refill   amiodarone  (PACERONE ) 200 MG tablet Take 1 tablet (200 mg total) by mouth daily. 90 tablet 3   apixaban  (ELIQUIS ) 5 MG TABS tablet Take 1 tablet (5 mg total) by mouth 2 (two) times daily. 60 tablet 11   atorvastatin  (LIPITOR) 40 MG tablet Take 1 tablet (40 mg total) by mouth daily. 90 tablet 3   b complex vitamins capsule Take 1 capsule by mouth  daily.     carvedilol  (COREG ) 3.125 MG tablet Take 2 tablets (6.25 mg total) by mouth 2 (two) times daily with a meal. 60 tablet 5   Cholecalciferol (VITAMIN D3) 50 MCG (2000 UT) capsule Take 1 capsule (2,000 Units total) by mouth daily. 100 capsule 3   dapagliflozin  propanediol (FARXIGA ) 10 MG TABS tablet Take 1 tablet (10 mg total) by mouth daily before breakfast. 30 tablet 11   losartan  (COZAAR ) 50 MG tablet Take 1 tablet (50 mg total) by mouth daily. 90 tablet  3   magnesium oxide (MAG-OX) 400 (240 Mg) MG tablet Take 400 mg by mouth daily as needed (cramps).     Multiple Vitamins-Minerals (MULTIVITAMIN ADULTS) TABS Take 1 tablet by mouth daily.     spironolactone  (ALDACTONE ) 25 MG tablet Take 0.5 tablets (12.5 mg total) by mouth daily. 45 tablet 3   torsemide  (DEMADEX ) 20 MG tablet Take 1 tablet (20 mg total) by mouth daily as needed (for weight gain > 3 lb in 24 hrs or 5 lb over dry weight). 30 tablet 2   zinc gluconate 50 MG tablet Take 50 mg by mouth daily.     No current facility-administered medications for this encounter.   Allergies  Allergen Reactions   Bourbon [Alcohol]     Pt had a reaction to bourbon a couple of months ago: He had a small drink of bourbon and developed severe face flushing and pain in both legs with stiffness (thighs, shins) that lasted for several days.   Social History   Socioeconomic History   Marital status: Divorced    Spouse name: Not on file   Number of children: 2   Years of education: Not on file   Highest education level: Some college, no degree  Occupational History   Occupation: Merchandiser, retail    Comment: [employer]  Tobacco Use   Smoking status: Never   Smokeless tobacco: Current    Types: Chew  Vaping Use   Vaping status: Never Used  Substance and Sexual Activity   Alcohol use: Yes    Alcohol/week: 17.0 standard drinks of alcohol    Types: 17 Shots of liquor per week    Comment: drinks fifth of liquor per week x 1 year.   Drug use: No   Sexual activity: Yes  Other Topics Concern   Not on file  Social History Narrative   Regular Exercise -  YES         Social Drivers of Health   Financial Resource Strain: High Risk (02/15/2021)   Overall Financial Resource Strain (CARDIA)    Difficulty of Paying Living Expenses: Hard  Food Insecurity: No Food Insecurity (02/15/2021)   Hunger Vital Sign    Worried About Running Out of Food in the Last Year: Never true    Ran Out of Food in the Last Year:  Never true  Transportation Needs: No Transportation Needs (02/15/2021)   PRAPARE - Administrator, Civil Service (Medical): No    Lack of Transportation (Non-Medical): No  Physical Activity: Not on file  Stress: Not on file  Social Connections: Not on file  Intimate Partner Violence: Not on file   Family History  Problem Relation Age of Onset   Heart disease Father 54       MI   BP 124/84   Pulse (!) 56   Ht 5' 9 (1.753 m)   Wt 107 kg (236 lb)   SpO2 97%   BMI 34.85 kg/m  Wt Readings from Last 3 Encounters:  10/09/23 107 kg (236 lb)  06/06/23 107.1 kg (236 lb 3.2 oz)  05/31/23 105.7 kg (233 lb)   PHYSICAL EXAM: General: Well appearing. No distress on RA Cardiac: JVP flat. S1 and S2 present. No murmurs Extremities: Warm and dry.  No peripheral edema.  Neuro: Alert and oriented x3. Affect pleasant. Moves all extremities without difficulty.  ASSESSMENT & PLAN: Chronic HFimpEF: - NICM. Tachy-mediated +/- ETOH.  - EF initially down to 20-25% with mod RV dysfunction and mod MR/TR in 1/23, improved to 50-55% by 5/23 with NSR and GDMT - cMRI (1/23): LVEF 21% RV 25%. LAA thrombus. No scar.  - R/LHC (1/23): minimal CAD, well compensated filling pressures and normal output.  - Most recent echo 6/25: EF 60-65%, G1DD - NYHA II, mainly fatigue. Fatigue appears out of proportion to HF. Volume ok, has torsemide  20 mg PRN - stop coreg  with dizziness - continue Farxiga  10 mg daily (has never picked up) - continue spiro 12.5 mg daily - continue losartan  50 mg daily   2. Paroxysmal Atrial Fibrillation  - Diagnosed 2/23. In setting of ETOH use.  - LAA thrombus noted on TEE, DCCV deferred. Spontaneously converted to NSR on amio. - Sleep study 5/23 negative for OSA, AHI 3.9/hr. - OSA negative; TSH normal; has cut back ETOH significantly - s/p DCCV 05/19/23 to NSR - Referred to EP for ablation eval, however did not follow up - Repeat TSH, free T4 and sleep study given  symptoms - continue amio 200 mg daily, would attempt to titrate/stop at next visit if no AF recurrence given young age - continue Eliquis  5 mg bid   3. ETOH abuse - Previous heavy drinker - Has cut back considerably, congratulated  4. H/o LAA Thrombus  - noted by CMR/TEE in 1/23, not seen on imaging since - continue eliquis  as above   5. CVA - MRI (1/23) new left MCA embolic - No deficits.  - Follows with Dr. Rosemarie - Continue statin + eliquis   6. HTN - BP elevated at home, SBP ~150 - stop coreg  - start norvasc  5 mg daily  7. Raynaud's Phenomenon - finger tips change colors with numbness/tingling when temp <29F - check thyroid  studies given symptoms, concerned for hypothyroidism - start norvasc  5 mg daily  Follow up in 4 month with Dr. Bensimhon   [place] Haru Anspaugh, NP 10/09/23

## 2023-10-09 NOTE — Progress Notes (Signed)
 ITAMAR home sleep study given to patient, all instructions explained, waiver signed, and CLOUDPAT registration complete.  Height:     Weight: BMI:  Today's Date:  STOP BANG RISK ASSESSMENT S (snore) Have you been told that you snore?     YES   T (tired) Are you often tired, fatigued, or sleepy during the day?   YES  O (obstruction) Do you stop breathing, choke, or gasp during sleep? NO   P (pressure) Do you have or are you being treated for high blood pressure? YES   B (BMI) Is your body index greater than 35 kg/m? NO   A (age) Are you 31 years old or older? YES   N (neck) Do you have a neck circumference greater than 16 inches?   NO   G (gender) Are you a male? YES   TOTAL STOP/BANG "YES" ANSWERS 5                                                                       For Office Use Only              Procedure Order Form    YES to 3+ Stop Bang questions OR two clinical symptoms - patient qualifies for WatchPAT (CPT 95800)      Clinical Notes: Will consult Sleep Specialist and refer for management of therapy due to patient increased risk of Sleep Apnea. Ordering a sleep study due to the following two clinical symptoms: Excessive daytime sleepiness G47.10 / Gastroesophageal reflux K21.9 / Nocturia R35.1 / Morning Headaches G44.221 / Difficulty concentrating R41.840 / Memory problems or poor judgment G31.84 / Personality changes or irritability R45.4 / Loud snoring R06.83 / Depression F32.9 / Unrefreshed by sleep G47.8 / Impotence N52.9 / History of high blood pressure R03.0 / Insomnia G47.00

## 2023-10-10 ENCOUNTER — Ambulatory Visit (HOSPITAL_COMMUNITY): Payer: Self-pay | Admitting: Cardiology

## 2023-10-16 ENCOUNTER — Telehealth (HOSPITAL_COMMUNITY): Payer: Self-pay

## 2023-10-23 ENCOUNTER — Ambulatory Visit (HOSPITAL_COMMUNITY): Admission: EM | Admit: 2023-10-23 | Discharge: 2023-10-23 | Disposition: A | Discharge status: Home / Self Care

## 2023-10-23 ENCOUNTER — Encounter (HOSPITAL_COMMUNITY): Payer: Self-pay

## 2023-10-23 ENCOUNTER — Telehealth (HOSPITAL_COMMUNITY): Payer: Self-pay | Admitting: Cardiology

## 2023-10-23 ENCOUNTER — Ambulatory Visit (INDEPENDENT_AMBULATORY_CARE_PROVIDER_SITE_OTHER)

## 2023-10-23 DIAGNOSIS — I480 Paroxysmal atrial fibrillation: Secondary | ICD-10-CM

## 2023-10-23 DIAGNOSIS — M792 Neuralgia and neuritis, unspecified: Secondary | ICD-10-CM

## 2023-10-23 MED ORDER — GABAPENTIN 300 MG PO CAPS: 300.0000 mg | ORAL_CAPSULE | Freq: Every day | ORAL | 0 refills | Status: DC

## 2023-10-23 MED ORDER — PREDNISONE 20 MG PO TABS: 40.0000 mg | ORAL_TABLET | Freq: Every day | ORAL | 0 refills | Status: AC

## 2023-10-23 NOTE — Discharge Instructions (Addendum)
 Take prednisone 40 mg for next 5 days as prescribed Take gabapentin 300 mg at night as needed for nerve pain and sleep Follow-up with your cardiologist to soon as possible to consider switching off of amiodarone 

## 2023-10-23 NOTE — ED Triage Notes (Signed)
 Pt presents with burning in both thighs x 6 weeks ago. Denies any injury. He is a Psychologist, occupational and on his feet a lot. Reports it got significantly worse x 1 week ago. Pain is waking him up at night. Reports minimal relief with tylenol  and epsom salt baths.

## 2023-10-23 NOTE — Telephone Encounter (Signed)
 [facility] Patient reports he was seen in the urgent care this morning for severe fatigue, burning pains in thighs, and eye fatigue  Was told by urgent care provider that amiodarone  could be the cause of above side effects   Patient would like to know if medication can be changed?

## 2023-10-23 NOTE — Telephone Encounter (Signed)
 Pt aware and voiced understanding

## 2023-10-23 NOTE — ED Provider Notes (Signed)
 [facility]    CSN: [id number] Arrival date & time: 10/23/23  9185      History   Chief Complaint No chief complaint on file.   HPI Eddie Dixon is a 58 y.o. male.   HPI  Patient is a 58 year old male with a past medical history of but not limited to hyperglycemia, hyperlipidemia, prior CVA who presents today with 6 weeks of burning pain of bilateral thighs, both anterior and posterior.  Patient denies any acute inciting event but says symptoms have progressively gotten worse over the last couple weeks.  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  Has taken Tylenol  for pain but does not help much.  Has also taken Epsom salt baths but not helping much.  Burning pain is now limiting sleeping ability.  Denies any restrictive tight garments that he wears during work hours.  Denies any prior history of low back pain with radiculopathy.  Denies any current weakness.  Denies any recent infections.  Very mild alcohol use, 1-2 beers weekly.  Eats well-balanced diet.  Has recently had lab works including normal TSH, BMP showing mildly elevated creatinine at 1.29 normal B12.  Does take amiodarone  for A-fib.  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   Dizziness 03/06/2023   Balance problem 03/06/2023   Allergic reaction 02/07/2023   BPH (benign prostatic hyperplasia) 11/23/2022   Hyperglycemia 11/23/2022   Hyperlipidemia 01/07/2022   Cramps of lower extremity 09/06/2021   History of cardioembolic cerebrovascular accident (CVA) 03/09/2021   Cerebral thrombosis with cerebral infarction 02/21/2021   New onset of congestive heart failure (HCC) 02/13/2021   Acute HFrEF (heart failure with reduced ejection fraction) (HCC) 02/13/2021   AKI (acute kidney injury)  02/13/2021   Alcohol use 02/13/2021   Paresthesia 01/27/2021   Family history of early CAD 01/27/2021   Atrial fibrillation with RVR (HCC) 01/27/2021   Urinary tract infection 01/27/2021   Atrial fibrillation and flutter (HCC) 01/27/2021   Insomnia 11/16/2020   Exposure to STD 06/13/2017   Well adult exam 06/20/2012   Cerumen impaction 06/20/2012   ABNORMAL GLUCOSE NEC 02/01/2008   Anxiety state 04/05/2007   TOBACCO USE DISORDER/SMOKER-SMOKING CESSATION DISCUSSED 04/05/2007   ERECTILE DYSFUNCTION 12/01/2006    Past Surgical History:  Procedure Laterality Date   BUBBLE STUDY  02/16/2021   Procedure: BUBBLE STUDY;  Surgeon: Sheena Pugh, DO;  Location: [facility];  Service: Cardiovascular;;   CARDIOVERSION N/A 05/19/2023   Procedure: CARDIOVERSION;  Surgeon: Cherrie Toribio SAUNDERS, MD;  Location: [facility];  Service: Cardiovascular;  Laterality: N/A;   RIGHT/LEFT HEART CATH AND CORONARY ANGIOGRAPHY N/A 02/19/2021   Procedure: RIGHT/LEFT HEART CATH AND CORONARY ANGIOGRAPHY;  Surgeon: Cherrie Toribio SAUNDERS, MD;  Location: [facility];  Service: Cardiovascular;  Laterality: N/A;   TEE WITHOUT CARDIOVERSION N/A 02/16/2021   Procedure: TRANSESOPHAGEAL ECHOCARDIOGRAM (TEE);  Surgeon: Tobb, Kardie, DO;  Location: [facility];  Service: Cardiovascular;  Laterality: N/A;       Home Medications    Prior to Admission medications   Medication Sig Start Date End Date Taking? Authorizing Provider  amiodarone  (PACERONE ) 200 MG tablet Take 1 tablet (200 mg total) by mouth daily. 06/06/23   Glena Harlene HERO,  FNP  amLODipine  (NORVASC ) 5 MG tablet Take 1 tablet (5 mg total) by mouth daily. 10/09/23   Lee, [place], NP  apixaban  (ELIQUIS ) 5 MG TABS tablet Take 1 tablet (5 mg total) by mouth 2 (two) times daily. 06/06/23   [place], Harlene HERO, FNP  atorvastatin  (LIPITOR) 40 MG tablet Take 1 tablet (40 mg total) by mouth daily. 08/03/23   Bensimhon, Daniel R, MD  b complex vitamins capsule Take  1 capsule by mouth daily.    [provider]  Cholecalciferol (VITAMIN D3) 50 MCG (2000 UT) capsule Take 1 capsule (2,000 Units total) by mouth daily. 01/27/21   Plotnikov, Aleksei V, MD  dapagliflozin  propanediol (FARXIGA ) 10 MG TABS tablet Take 1 tablet (10 mg total) by mouth daily before breakfast. 04/28/23   [place], Harlene HERO, FNP  losartan  (COZAAR ) 50 MG tablet Take 1 tablet (50 mg total) by mouth daily. 08/03/23   Bensimhon, Toribio SAUNDERS, MD  magnesium oxide (MAG-OX) 400 (240 Mg) MG tablet Take 400 mg by mouth daily as needed (cramps).    [provider]  Multiple Vitamins-Minerals (MULTIVITAMIN ADULTS) TABS Take 1 tablet by mouth daily.    [provider]  spironolactone  (ALDACTONE ) 25 MG tablet Take 0.5 tablets (12.5 mg total) by mouth daily. 06/06/23   [place], Harlene HERO, FNP  torsemide  (DEMADEX ) 20 MG tablet Take 1 tablet (20 mg total) by mouth daily as needed (for weight gain > 3 lb in 24 hrs or 5 lb over dry weight). 06/06/23   Glena Harlene HERO, FNP  zinc gluconate 50 MG tablet Take 50 mg by mouth daily.    [provider]    Family History Family History  Problem Relation Age of Onset   Heart disease Father 24       MI    Social History Social History   Tobacco Use   Smoking status: Never   Smokeless tobacco: Current    Types: Chew  Vaping Use   Vaping status: Never Used  Substance Use Topics   Alcohol use: Yes    Alcohol/week: 17.0 standard drinks of alcohol    Types: 17 Shots of liquor per week    Comment: drinks fifth of liquor per week x 1 year.   Drug use: No     Allergies   Bourbon [alcohol]   Review of Systems Review of Systems  Negative except as noted above. Physical Exam Triage Vital Signs ED Triage Vitals  Encounter Vitals Group     BP      Girls Systolic BP Percentile      Girls Diastolic BP Percentile      Boys Systolic BP Percentile      Boys Diastolic BP Percentile      Pulse      Resp      Temp      Temp  src      SpO2      Weight      Height      Head Circumference      Peak Flow      Pain Score      Pain Loc      Pain Education      Exclude from Growth Chart    No data found.  Updated Vital Signs There were no vitals taken for this visit.  Visual Acuity Right Eye Distance:   Left Eye Distance:   Bilateral Distance:    Right Eye Near:   Left Eye Near:  Bilateral Near:     Physical Exam Vitals and nursing note reviewed.  Constitutional:      General: He is not in acute distress.    Appearance: He is well-developed.  HENT:     Head: Normocephalic and atraumatic.  Eyes:     Conjunctiva/sclera: Conjunctivae normal.  Cardiovascular:     Rate and Rhythm: Normal rate and regular rhythm.     Heart sounds: No murmur heard. Pulmonary:     Effort: Pulmonary effort is normal. No respiratory distress.     Breath sounds: Normal breath sounds.  Abdominal:     Palpations: Abdomen is soft.     Tenderness: There is no abdominal tenderness.  Musculoskeletal:        General: No swelling. Normal range of motion.     Cervical back: Neck supple.  Skin:    General: Skin is warm and dry.     Capillary Refill: Capillary refill takes less than 2 seconds.  Neurological:     General: No focal deficit present.     Mental Status: He is alert. Mental status is at baseline.     Sensory: No sensory deficit.     Motor: No weakness.     Coordination: Coordination normal.     Gait: Gait normal.     Deep Tendon Reflexes: Reflexes normal.  Psychiatric:        Mood and Affect: Mood normal.      UC Treatments / Results  Labs (all labs ordered are listed, but only abnormal results are displayed) Labs Reviewed - No data to display  EKG   Radiology No results found.  Procedures Procedures (including critical care time)  Medications Ordered in UC Medications - No data to display  Initial Impression / Assessment and Plan / UC Course  I have reviewed the triage vital signs and the  nursing notes.  Pertinent labs & imaging results that were available during my care of the patient were reviewed by me and considered in my medical decision making (see chart for details).    #Bilateral peripheral neuropathy, bilateral thighs -DDx includes lumbar radiculopathy versus meralgia paresthetica versus amiodarone  side effect versus other -Given insidious onset at 6 weeks with no other significant risk factors for lumbar radiculopathy or meralgia paresthetica I am inclined to believe new onset small fiber neuropathy is related to amiodarone  side effect -Recommend patient to meet with cardiologist to review medications and discuss possibly switching off of amiodarone  -Ordered lumbar x-ray series to further evaluate potential of lumbar radiculopathy; within normal limits on my interpretation will wait for radiology official read. -Will also prescribe 5-day course of prednisone 40 mg -Will prescribe gabapentin as needed for neuropathic pain and help with sleep  Final Clinical Impressions(s) / UC Diagnoses   Final diagnoses:  None   Discharge Instructions   None    ED Prescriptions   None    PDMP not reviewed this encounter.   Lynwood Barter, DO 10/23/23 [phone]

## 2023-10-24 ENCOUNTER — Ambulatory Visit (HOSPITAL_COMMUNITY): Payer: Self-pay

## 2023-11-14 ENCOUNTER — Telehealth (HOSPITAL_COMMUNITY): Payer: Self-pay

## 2023-11-14 NOTE — Telephone Encounter (Signed)
 Attempted to call patient, unable to reach. Left message that he can proceed with Itamar sleep testing as ordered.

## 2024-01-07 ENCOUNTER — Encounter (HOSPITAL_COMMUNITY): Payer: Self-pay | Admitting: Emergency Medicine

## 2024-01-07 ENCOUNTER — Ambulatory Visit (HOSPITAL_COMMUNITY)
Admission: EM | Admit: 2024-01-07 | Discharge: 2024-01-07 | Disposition: A | Discharge status: Home / Self Care | Attending: Student | Admitting: Student

## 2024-01-07 DIAGNOSIS — H9319 Tinnitus, unspecified ear: Secondary | ICD-10-CM

## 2024-01-07 DIAGNOSIS — I480 Paroxysmal atrial fibrillation: Secondary | ICD-10-CM

## 2024-01-07 DIAGNOSIS — H8111 Benign paroxysmal vertigo, right ear: Secondary | ICD-10-CM

## 2024-01-07 DIAGNOSIS — Z760 Encounter for issue of repeat prescription: Secondary | ICD-10-CM

## 2024-01-07 DIAGNOSIS — M792 Neuralgia and neuritis, unspecified: Secondary | ICD-10-CM

## 2024-01-07 MED ORDER — GABAPENTIN 300 MG PO CAPS: 300.0000 mg | ORAL_CAPSULE | Freq: Every day | ORAL | 1 refills | Status: AC

## 2024-01-07 MED ORDER — MECLIZINE HCL 25 MG PO TABS: 25.0000 mg | ORAL_TABLET | Freq: Three times a day (TID) | ORAL | 0 refills | Status: AC | PRN

## 2024-01-07 NOTE — ED Provider Notes (Signed)
 [facility]    CSN: [id number] Arrival date & time: 01/07/24  1035      History   Chief Complaint Chief Complaint  Patient presents with   Tinnitus    HPI Eddie Dixon is a 58 y.o. male presenting with multiple complaints. - Vertigo and right ear tinnitus.  States these are recurrent symptoms, he has had this in the past, and was seen by an ENT.  Has been prescribed meclizine , which he did not take.  Describes the vertigo as sensation of room spinning.  There was tinnitus, but no longer.  Denies ear pain.  Denies recent URI.  Denies HA. - Episode of palpitations that occurred 5 days ago, which felt like his paroxysmal A-fib.  He denies current palpitations, chest pain, shortness of breath, leg swelling.  Taking Eliquis  as directed.  Also with questions about his blood pressure and losartan  dosage.  His cardiologist is unaware of these current symptoms. - Recurrent leg discomfort.  Describes this as burning pain in the bilateral thighs, both anterior and posterior.  He has been seen for this before at our urgent care, and is requesting a refill on the gabapentin .  Has not followed up with Ortho.  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   Dizziness 03/06/2023   Balance problem 03/06/2023   Allergic reaction 02/07/2023   BPH (benign prostatic hyperplasia) 11/23/2022   Hyperglycemia 11/23/2022   Hyperlipidemia 01/07/2022   Cramps of lower extremity 09/06/2021   History of cardioembolic cerebrovascular accident (CVA) 03/09/2021   Cerebral thrombosis with cerebral infarction 02/21/2021   New onset of congestive heart failure (HCC) 02/13/2021   Acute HFrEF (heart failure with reduced ejection fraction) (HCC) 02/13/2021   AKI (acute kidney injury) 02/13/2021    Alcohol use 02/13/2021   Paresthesia 01/27/2021   Family history of early CAD 01/27/2021   Atrial fibrillation with RVR (HCC) 01/27/2021   Urinary tract infection 01/27/2021   Atrial fibrillation and flutter (HCC) 01/27/2021   Insomnia 11/16/2020   Exposure to STD 06/13/2017   Well adult exam 06/20/2012   Cerumen impaction 06/20/2012   ABNORMAL GLUCOSE NEC 02/01/2008   Anxiety state 04/05/2007   TOBACCO USE DISORDER/SMOKER-SMOKING CESSATION DISCUSSED 04/05/2007   ERECTILE DYSFUNCTION 12/01/2006    Past Surgical History:  Procedure Laterality Date   BUBBLE STUDY  02/16/2021   Procedure: BUBBLE STUDY;  Surgeon: Sheena Pugh, DO;  [facility]: [facility];  Service: Cardiovascular;;   CARDIOVERSION N/A 05/19/2023   Procedure: CARDIOVERSION;  Surgeon: Cherrie Toribio SAUNDERS, MD;  [facility];  Service: Cardiovascular;  Laterality: N/A;   RIGHT/LEFT HEART CATH AND CORONARY ANGIOGRAPHY N/A 02/19/2021   Procedure: RIGHT/LEFT HEART CATH AND CORONARY ANGIOGRAPHY;  Surgeon: Cherrie Toribio SAUNDERS, MD;  [facility];  Service: Cardiovascular;  Laterality: N/A;   TEE WITHOUT CARDIOVERSION N/A 02/16/2021   Procedure: TRANSESOPHAGEAL ECHOCARDIOGRAM (TEE);  Surgeon: Tobb, Kardie, DO;  [facility];  Service: Cardiovascular;  Laterality: N/A;       Home Medications    Prior to Admission medications  Medication Sig Start Date End Date Taking? Authorizing Provider  meclizine  (ANTIVERT ) 25 MG tablet Take 1 tablet (25 mg total) by mouth 3 (three) times daily as needed for dizziness.  01/07/24  Yes Vedh Ptacek E, PA-C  amiodarone  (PACERONE ) 200 MG tablet Take 1 tablet (200 mg total) by mouth daily. 06/06/23   Milford, Harlene HERO, FNP  amLODipine  (NORVASC ) 5 MG tablet Take 1 tablet (5 mg total) by mouth daily. 10/09/23   Lee, Jordan, NP  apixaban  (ELIQUIS ) 5 MG TABS tablet Take 1 tablet (5 mg total) by mouth 2 (two) times daily. 06/06/23   Milford, Harlene HERO, FNP   atorvastatin  (LIPITOR) 40 MG tablet Take 1 tablet (40 mg total) by mouth daily. 08/03/23   Bensimhon, Daniel R, MD  b complex vitamins capsule Take 1 capsule by mouth daily.    [provider]  Cholecalciferol (VITAMIN D3) 50 MCG (2000 UT) capsule Take 1 capsule (2,000 Units total) by mouth daily. 01/27/21   Plotnikov, Aleksei V, MD  dapagliflozin  propanediol (FARXIGA ) 10 MG TABS tablet Take 1 tablet (10 mg total) by mouth daily before breakfast. 04/28/23   Milford, Harlene HERO, FNP  gabapentin  (NEURONTIN ) 300 MG capsule Take 1 capsule (300 mg total) by mouth at bedtime. 01/07/24   Daijanae Rafalski E, PA-C  losartan  (COZAAR ) 50 MG tablet Take 1 tablet (50 mg total) by mouth daily. 08/03/23   Bensimhon, Toribio SAUNDERS, MD  magnesium oxide (MAG-OX) 400 (240 Mg) MG tablet Take 400 mg by mouth daily as needed (cramps).    [provider]  Multiple Vitamins-Minerals (MULTIVITAMIN ADULTS) TABS Take 1 tablet by mouth daily.    [provider]  spironolactone  (ALDACTONE ) 25 MG tablet Take 0.5 tablets (12.5 mg total) by mouth daily. 06/06/23   Milford, Harlene HERO, FNP  torsemide  (DEMADEX ) 20 MG tablet Take 1 tablet (20 mg total) by mouth daily as needed (for weight gain > 3 lb in 24 hrs or 5 lb over dry weight). 06/06/23   Glena Harlene HERO, FNP  zinc gluconate 50 MG tablet Take 50 mg by mouth daily.    [provider]    Family History Family History  Problem Relation Age of Onset   Heart disease Father 64       MI    Social History Social History[1]   Allergies   Bourbon [alcohol]   Review of Systems Review of Systems  HENT:         Vertigo     Physical Exam Triage Vital Signs ED Triage Vitals  Encounter Vitals Group     BP 01/07/24 1049 (!) 146/96     Girls Systolic BP Percentile --      Girls Diastolic BP Percentile --      Boys Systolic BP Percentile --      Boys Diastolic BP Percentile --      Pulse Rate 01/07/24 1049 69     Resp 01/07/24 1049 16     Temp  01/07/24 1049 97.9 F (36.6 C)     Temp Source 01/07/24 1049 Oral     SpO2 01/07/24 1049 98 %     Weight --      Height --      Head Circumference --      Peak Flow --      Pain Score 01/07/24 1047 0     Pain Loc --      Pain Education --      Exclude from Growth Chart --    No data found.  Updated Vital Signs BP (!) 146/96 (BP Location: Right Arm)   Pulse 69   Temp 97.9 F (36.6 C) (Oral)   Resp  16   SpO2 98%   Visual Acuity Right Eye Distance:   Left Eye Distance:   Bilateral Distance:    Right Eye Near:   Left Eye Near:    Bilateral Near:     Physical Exam Vitals reviewed.  Constitutional:      General: He is not in acute distress.    Appearance: Normal appearance. He is not ill-appearing or diaphoretic.  HENT:     Head: Normocephalic and atraumatic.     Right Ear: Hearing, tympanic membrane, ear canal and external ear normal.     Left Ear: Hearing, tympanic membrane, ear canal and external ear normal.  Cardiovascular:     Rate and Rhythm: Normal rate and regular rhythm.     Pulses: Normal pulses.     Heart sounds: Normal heart sounds.  Pulmonary:     Effort: Pulmonary effort is normal.     Breath sounds: Normal breath sounds.  Skin:    General: Skin is warm.  Neurological:     General: No focal deficit present.     Mental Status: He is alert and oriented to person, place, and time.     Comments: PERRLA, EOMI. Strength and sensation grossly intact upper and lower extremities. Gait intact.   Psychiatric:        Mood and Affect: Mood normal.        Behavior: Behavior normal.        Thought Content: Thought content normal.        Judgment: Judgment normal.      UC Treatments / Results  Labs (all labs ordered are listed, but only abnormal results are displayed) Labs Reviewed - No data to display  EKG   Radiology No results found.  Procedures Procedures (including critical care time)  Medications Ordered in UC Medications - No data to  display  Initial Impression / Assessment and Plan / UC Course  I have reviewed the triage vital signs and the nursing notes.  Pertinent labs & imaging results that were available during my care of the patient were reviewed by me and considered in my medical decision making (see chart for details).     Patient is a pleasant 58 y.o. male presenting with vertigo, paroxysmal a fib, and medication refill. The patient is afebrile and nontachycardic.  Antipyretic has not been administered today. Neuro exam is reassuring.   EKG NSR.  He is not currently in A-fib.  He understands that a normal EKG is only a brief picture in time.  I encouraged him to follow-up with his cardiologist regarding his atrial fibrillation and blood pressure medication.  Regarding the vertigo, this is a recurrent issue, for which he has been followed by ENT in the past.  Neuroexam is reassuring.  I sent meclizine .  I also sent an ENT referral.  I refilled the gabapentin  for his neuropathy pain.  I encouraged him to follow-up with an orthopedist for further management.  Referral placed.  He understands that he may need to call the ENT and orthopedist to schedule these appointments.  Level 4 for acute exacerbation of chronic condition and prescription drug management.   Final Clinical Impressions(s) / UC Diagnoses   Final diagnoses:  Paroxysmal atrial fibrillation (HCC)  Tinnitus, unspecified laterality  Peripheral neurogenic pain  Medication refill  Benign paroxysmal positional vertigo of right ear     Discharge Instructions      - Your EKG looks normal today.  A normal EKG is a great sign.  However, an EKG is a brief picture in time of the heart, and does not totally rule out a heart issue.  For this evaluation, you would need cardiac monitoring and lab work done in the emergency department.  I would recommend you follow-up with cardiology regarding blood pressure and A-fib questions. -I refilled the gabapentin   for your leg pain.  I recommend that you follow-up with sports medicine.  The doctor that you saw was Dr. Marsa Agent. -For your vertigo, I sent meclizine .  You can take this up to 3 times daily as needed for dizziness.  This medication can cause drowsiness, so do not take before driving.  I also sent a referral to ENT to further discuss vertigo. -I sent referrals to sports medicine and ENT, but if you do not hear from them within about a week, please call them to schedule this appointment.   ED Prescriptions     Medication Sig Dispense Auth. Provider   gabapentin  (NEURONTIN ) 300 MG capsule Take 1 capsule (300 mg total) by mouth at bedtime. 30 capsule Claudett Bayly E, PA-C   meclizine  (ANTIVERT ) 25 MG tablet Take 1 tablet (25 mg total) by mouth 3 (three) times daily as needed for dizziness. 30 tablet Vivica Dobosz E, PA-C      PDMP not reviewed this encounter.    [1]  Social History Tobacco Use   Smoking status: Never   Smokeless tobacco: Current    Types: Chew  Vaping Use   Vaping status: Never Used  Substance Use Topics   Alcohol use: Yes    Alcohol/week: 17.0 standard drinks of alcohol    Types: 17 Shots of liquor per week    Comment: drinks fifth of liquor per week x 1 year.   Drug use: No     Arlyss Leita BRAVO, PA-C 01/07/24 1210

## 2024-01-07 NOTE — Discharge Instructions (Signed)
-   Your EKG looks normal today.  A normal EKG is a great sign.  However, an EKG is a brief picture in time of the heart, and does not totally rule out a heart issue.  For this evaluation, you would need cardiac monitoring and lab work done in the emergency department.  I would recommend you follow-up with cardiology regarding blood pressure and A-fib questions. -I refilled the gabapentin  for your leg pain.  I recommend that you follow-up with sports medicine.  The doctor that you saw was Dr. Marsa Agent. -For your vertigo, I sent meclizine .  You can take this up to 3 times daily as needed for dizziness.  This medication can cause drowsiness, so do not take before driving.  I also sent a referral to ENT to further discuss vertigo. -I sent referrals to sports medicine and ENT, but if you do not hear from them within about a week, please call them to schedule this appointment.

## 2024-01-07 NOTE — ED Triage Notes (Addendum)
 Patient has had ringing in his right ear since Wednesday. Patient to flush it not with peroxide , patient stated he has been having dizziest and palpations earlier in the week

## 2024-01-08 ENCOUNTER — Encounter (INDEPENDENT_AMBULATORY_CARE_PROVIDER_SITE_OTHER): Payer: Self-pay

## 2024-01-23 ENCOUNTER — Other Ambulatory Visit (HOSPITAL_COMMUNITY): Payer: Self-pay

## 2024-01-23 ENCOUNTER — Ambulatory Visit (INDEPENDENT_AMBULATORY_CARE_PROVIDER_SITE_OTHER): Admitting: Otolaryngology

## 2024-01-23 ENCOUNTER — Encounter (INDEPENDENT_AMBULATORY_CARE_PROVIDER_SITE_OTHER): Payer: Self-pay | Admitting: Otolaryngology

## 2024-01-23 VITALS — BP 143/82 | Temp 97.5°F | Ht 69.0 in | Wt 220.0 lb

## 2024-01-23 DIAGNOSIS — R42 Dizziness and giddiness: Secondary | ICD-10-CM

## 2024-01-23 MED ORDER — MECLIZINE HCL 25 MG PO TABS: 25.0000 mg | ORAL_TABLET | Freq: Three times a day (TID) | ORAL | 0 refills | Status: DC | PRN

## 2024-01-23 NOTE — Progress Notes (Signed)
 Reason for Consult: Vertigo Referring Physician: Dr. Arlyss Anton Eddie Dixon is an 58 y.o. male.  HPI: Here for evaluation of dizziness.  He had an episode about 6 months ago where he felt like his environment was moving.  That lasted just a number of hours.  He then had a second episode where he was more vertiginous but it also was temporary.  His third episode was pretty significant and he felt like he was floating and upside down.  He has a roaring in the right ear that occurs with the vertigo.  It at least happen the second 2 episodes.  He also has a fullness in the ear that occurs.  He does feel like his hearing is decreased.  He still having imbalance problems now.  His last episode was about 2 weeks ago.  He does not have any neurologic problems that he is aware of.  He has had some cardiac problems.  He is under the care of a cardiologist.  He denies any new medications since the onset.  Past Medical History:  Diagnosis Date   A-fib [facility])    Anxiety    ED (erectile dysfunction)    Elevated blood pressure     Past Surgical History:  Procedure Laterality Date   BUBBLE STUDY  02/16/2021   Procedure: BUBBLE STUDY;  Surgeon: Sheena Pugh, DO;  [facility];  Service: Cardiovascular;;   CARDIOVERSION N/A 05/19/2023   Procedure: CARDIOVERSION;  Surgeon: Cherrie Toribio SAUNDERS, MD;  [facility];  Service: Cardiovascular;  Laterality: N/A;   RIGHT/LEFT HEART CATH AND CORONARY ANGIOGRAPHY N/A 02/19/2021   Procedure: RIGHT/LEFT HEART CATH AND CORONARY ANGIOGRAPHY;  Surgeon: Cherrie Toribio SAUNDERS, MD;  [facility];  Service: Cardiovascular;  Laterality: N/A;   TEE WITHOUT CARDIOVERSION N/A 02/16/2021   Procedure: TRANSESOPHAGEAL ECHOCARDIOGRAM (TEE);  Surgeon: Tobb, Kardie, DO;  [facility];  Service: Cardiovascular;  Laterality: N/A;    Family History  Problem Relation Age of Onset   Heart disease Father 42       MI    Social History:  reports  that he has never smoked. His smokeless tobacco use includes chew. He reports current alcohol use of about 17.0 standard drinks of alcohol per week. He reports that he does not use drugs.  Allergies: Allergies[1]   No results found for this or any previous visit (from the past 48 hours).  No results found.  ROS Blood pressure (!) 143/82, temperature (!) 97.5 F (36.4 C), temperature source Oral, height 5' 9 (1.753 m), weight 220 lb (99.8 kg), SpO2 98%. Physical Exam Constitutional:      Appearance: Normal appearance.  HENT:     Head: Normocephalic and atraumatic.     Right Ear: Tympanic membrane is without lesions and middle ear aerated, ear canal and external ear normal.     Left Ear: Tympanic membrane is without lesions and middle ear aerated, ear canal and external ear normal.     Nose: Nose without deviation of septum.  Turbinates with mild hypertrophy, No significant swelling or masses.     Oral cavity/oropharynx: Mucous membranes are moist. No lesions or masses    Larynx: normal voice. Mirror attempted without success    Eyes:     Extraocular Movements: Extraocular movements intact.     Conjunctiva/sclera: Conjunctivae normal.     Pupils: Pupils are equal, round, and reactive to light.  Cardiovascular:     Rate and Rhythm: Normal rate.  Pulmonary:  Effort: Pulmonary effort is normal.  Musculoskeletal:     Cervical back: Normal range of motion and neck supple. No rigidity.  Lymphadenopathy:     Cervical: No cervical adenopathy or masses.salivary glands without lesions. .     Salivary glands- no mass or swelling Neurological:     Mental Status: He is alert. CN 2-12 intact. No nystagmus      Assessment/Plan: Vertigo-this does sound like possible Mnire's.  We talked about workup which we will get an MRI scan and an audiogram.  We talked about treatment and he wants some meclizine  as he still symptomatic.  We talked about the delay in compensation using this but I  will give him a prescription.  We also talked about low-salt diet.  He will follow-up after the 2 studies.  He appears to have been on meclizine  previously but I told him not to take this while he is doing his work or at least test the product out prior to him performing any activity to be sure he is not sedated  Norleen Notice 01/23/2024, 9:46 AM         [1]  Allergies Allergen Reactions   Bourbon [Alcohol]     Pt had a reaction to bourbon a couple of months ago: He had a small drink of bourbon and developed severe face flushing and pain in both legs with stiffness (thighs, shins) that lasted for several days.

## 2024-01-30 ENCOUNTER — Ambulatory Visit (HOSPITAL_COMMUNITY)
Admission: RE | Admit: 2024-01-30 | Discharge: 2024-01-30 | Disposition: A | Discharge status: Home / Self Care | Source: Ambulatory Visit | Attending: Otolaryngology | Admitting: Otolaryngology

## 2024-01-30 DIAGNOSIS — H9190 Unspecified hearing loss, unspecified ear: Secondary | ICD-10-CM

## 2024-01-30 DIAGNOSIS — R42 Dizziness and giddiness: Secondary | ICD-10-CM | POA: Diagnosis present

## 2024-01-30 MED ORDER — GADOBUTROL 1 MMOL/ML IV SOLN
10.0000 mL | Freq: Once | INTRAVENOUS | Status: AC | PRN
  Administered 2024-01-30: 10 mL via INTRAVENOUS

## 2024-02-01 ENCOUNTER — Telehealth (HOSPITAL_COMMUNITY): Payer: Self-pay | Admitting: Internal Medicine

## 2024-02-01 NOTE — Telephone Encounter (Signed)
 Called to confirm/remind patient of their appointment [facility] on 02/01/24.   Appointment:   [] Confirmed  [x] Left mess   [] No answer/No voice mail  [] VM Full/unable to leave message  [] Phone not in service  Patient reminded to bring all medications and/or complete list.  Confirmed patient has transportation. Gave directions, instructed to utilize valet parking.

## 2024-02-02 ENCOUNTER — Encounter (HOSPITAL_COMMUNITY): Payer: Self-pay | Admitting: Internal Medicine

## 2024-02-02 ENCOUNTER — Ambulatory Visit (HOSPITAL_COMMUNITY)
Admission: RE | Admit: 2024-02-02 | Discharge: 2024-02-02 | Disposition: A | Source: Ambulatory Visit | Attending: Internal Medicine | Admitting: Internal Medicine

## 2024-02-02 VITALS — BP 140/80 | HR 65 | Wt 230.6 lb

## 2024-02-02 DIAGNOSIS — I110 Hypertensive heart disease with heart failure: Secondary | ICD-10-CM | POA: Diagnosis not present

## 2024-02-02 DIAGNOSIS — I513 Intracardiac thrombosis, not elsewhere classified: Secondary | ICD-10-CM | POA: Diagnosis not present

## 2024-02-02 DIAGNOSIS — I5032 Chronic diastolic (congestive) heart failure: Secondary | ICD-10-CM | POA: Insufficient documentation

## 2024-02-02 DIAGNOSIS — I5022 Chronic systolic (congestive) heart failure: Secondary | ICD-10-CM | POA: Diagnosis not present

## 2024-02-02 DIAGNOSIS — I480 Paroxysmal atrial fibrillation: Secondary | ICD-10-CM | POA: Insufficient documentation

## 2024-02-02 DIAGNOSIS — I10 Essential (primary) hypertension: Secondary | ICD-10-CM

## 2024-02-02 DIAGNOSIS — Z79899 Other long term (current) drug therapy: Secondary | ICD-10-CM | POA: Insufficient documentation

## 2024-02-02 DIAGNOSIS — I428 Other cardiomyopathies: Secondary | ICD-10-CM | POA: Insufficient documentation

## 2024-02-02 DIAGNOSIS — Z7901 Long term (current) use of anticoagulants: Secondary | ICD-10-CM | POA: Insufficient documentation

## 2024-02-02 DIAGNOSIS — Z7984 Long term (current) use of oral hypoglycemic drugs: Secondary | ICD-10-CM | POA: Insufficient documentation

## 2024-02-02 DIAGNOSIS — I7300 Raynaud's syndrome without gangrene: Secondary | ICD-10-CM | POA: Diagnosis not present

## 2024-02-02 DIAGNOSIS — I4891 Unspecified atrial fibrillation: Secondary | ICD-10-CM

## 2024-02-02 DIAGNOSIS — Z8673 Personal history of transient ischemic attack (TIA), and cerebral infarction without residual deficits: Secondary | ICD-10-CM | POA: Insufficient documentation

## 2024-02-02 DIAGNOSIS — I2510 Atherosclerotic heart disease of native coronary artery without angina pectoris: Secondary | ICD-10-CM | POA: Diagnosis not present

## 2024-02-02 DIAGNOSIS — Z86718 Personal history of other venous thrombosis and embolism: Secondary | ICD-10-CM | POA: Insufficient documentation

## 2024-02-02 DIAGNOSIS — I639 Cerebral infarction, unspecified: Secondary | ICD-10-CM | POA: Diagnosis not present

## 2024-02-02 MED ORDER — DAPAGLIFLOZIN PROPANEDIOL 10 MG PO TABS: 10.0000 mg | ORAL_TABLET | Freq: Every day | ORAL | 3 refills | Status: AC

## 2024-02-02 MED ORDER — LOSARTAN POTASSIUM 100 MG PO TABS: 100.0000 mg | ORAL_TABLET | Freq: Every day | ORAL | 3 refills | Status: AC

## 2024-02-02 MED ORDER — SPIRONOLACTONE 25 MG PO TABS: 12.5000 mg | ORAL_TABLET | Freq: Every day | ORAL | 3 refills | Status: AC

## 2024-02-02 MED ORDER — APIXABAN 5 MG PO TABS: 5.0000 mg | ORAL_TABLET | Freq: Two times a day (BID) | ORAL | 3 refills | Status: AC

## 2024-02-02 MED ORDER — AMLODIPINE BESYLATE 5 MG PO TABS: 5.0000 mg | ORAL_TABLET | Freq: Every day | ORAL | Status: AC

## 2024-02-02 MED ORDER — AMIODARONE HCL 200 MG PO TABS: 200.0000 mg | ORAL_TABLET | Freq: Every day | ORAL | Status: AC

## 2024-02-02 NOTE — Progress Notes (Signed)
 "  ADVANCED HF CLINIC NOTE  Primary Care: Plotnikov, Karlynn GAILS, MD Primary cardiologist: Dr. Court HF Cardiologist: Dr. Cherrie  HPI: Eddie Dixon is a 59 y.o. male with history of HFrEF now HFpEF, Afib, and CVA. Has hx of ETOH abuse. He established care with Dr. Court on 01/28/21 for Afib with RVR diagnosed at PCP's office the day prior.    Admitted 1/23 with a/c CHF and with AF with RVR. Beside echo LVEF 20-25%. TEE showed LAA thrombus so unable to proceed with DCCV. BP were soft with sub-optimal UOP, and AHF consulted d/t concern for low-output CHF and to assess for advanced therapies. Underwent R/LHC showing minimal CAD, well compensated filling pressures and normal output. 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 cMRI showed EF 21% RV 25%. LAA thrombus. No scar. Had RUE weakness, MRI with small acute infarcts and he was placed on Eliquis . GDMT titrated and he was discharged home, weight 222 lbs.   Hospital follow up, losartan  started and DC-CV arranged, but subsequently cancelled after spontaneously converting to NSR. Echo 5/23 showed EF 50-55%.  Sleep study 5/23 negative for OSA.  Follow up 4/25, in AF and off several meds, including Eliquis . Started on amio, restarted AC. Underwent DCCV to NSR 05/19/23  Most recent echo 6/25: EF 60-65%, G1DD  He returns today for heart failure follow up. Feels good. Working FT as psychologist, occupational. Denies CP, SOB, orthopnea or PND. SBP 140 on average. Compliant with meds. Now off amio x 6 months. Drinking 1-3 beers/day no hard liquor   Cardiac Studies: - Echo (6/25): EF 60-65%, G1DD, nl RV, triv MR - Echo (5/23) EF 50-55%, mild LVH, grade II DD, normal RV. - Echo (1/23): EF 20-25%, RV moderately reduced, biatrial enlargement, mild to moderate MR, moderate TR - TEE (1/23): EF 30-35%, RV mildly reduced, LAA thrombus, possible  - cMRI (1/23): LVEF 21% RV 25%. LAA thrombus. No scar. NICM Tachy mediated + ETOH.  - R/LHC (1/23): RA 6, PA 30/5 (22), PCW 17, CO.CI fick  6.4/3.0, PVR 0.8. Minimal nonobstructive CAD, severe NICM EF ~20%  Past Medical History:  Diagnosis Date   A-fib [facility])    Anxiety    ED (erectile dysfunction)    Elevated blood pressure    Current Outpatient Medications  Medication Sig Dispense Refill   atorvastatin  (LIPITOR) 40 MG tablet Take 1 tablet (40 mg total) by mouth daily. 90 tablet 3   b complex vitamins capsule Take 1 capsule by mouth daily.     Cholecalciferol (VITAMIN D3) 50 MCG (2000 UT) capsule Take 1 capsule (2,000 Units total) by mouth daily. 100 capsule 3   gabapentin  (NEURONTIN ) 300 MG capsule Take 1 capsule (300 mg total) by mouth at bedtime. 30 capsule 1   losartan  (COZAAR ) 50 MG tablet Take 1 tablet (50 mg total) by mouth daily. 90 tablet 3   magnesium oxide (MAG-OX) 400 (240 Mg) MG tablet Take 400 mg by mouth daily as needed (cramps).     meclizine  (ANTIVERT ) 25 MG tablet Take 1 tablet (25 mg total) by mouth 3 (three) times daily as needed for dizziness. 30 tablet 0   Multiple Vitamins-Minerals (MULTIVITAMIN ADULTS) TABS Take 1 tablet by mouth daily.     torsemide  (DEMADEX ) 20 MG tablet Take 1 tablet (20 mg total) by mouth daily as needed (for weight gain > 3 lb in 24 hrs or 5 lb over dry weight). 30 tablet 2   zinc gluconate 50 MG tablet Take 50 mg by mouth daily.  amiodarone  (PACERONE ) 200 MG tablet Take 1 tablet (200 mg total) by mouth daily. (Patient not taking: Reported on 02/02/2024) 90 tablet 3   amLODipine  (NORVASC ) 5 MG tablet Take 1 tablet (5 mg total) by mouth daily. (Patient not taking: Reported on 02/02/2024) 30 tablet 5   apixaban  (ELIQUIS ) 5 MG TABS tablet Take 1 tablet (5 mg total) by mouth 2 (two) times daily. (Patient not taking: Reported on 02/02/2024) 60 tablet 11   dapagliflozin  propanediol (FARXIGA ) 10 MG TABS tablet Take 1 tablet (10 mg total) by mouth daily before breakfast. (Patient not taking: Reported on 02/02/2024) 30 tablet 11   spironolactone  (ALDACTONE ) 25 MG tablet Take 0.5 tablets (12.5 mg  total) by mouth daily. (Patient not taking: Reported on 02/02/2024) 45 tablet 3   No current facility-administered medications for this encounter.   No Active Allergies  Social History   Socioeconomic History   Marital status: Divorced    Spouse name: Not on file   Number of children: 2   Years of education: Not on file   Highest education level: Some college, no degree  Occupational History   Occupation: Merchandiser, Retail    Comment: [employer]  Tobacco Use   Smoking status: Never   Smokeless tobacco: Current    Types: Chew  Vaping Use   Vaping status: Never Used  Substance and Sexual Activity   Alcohol use: Yes    Alcohol/week: 17.0 standard drinks of alcohol    Types: 17 Shots of liquor per week    Comment: drinks fifth of liquor per week x 1 year.   Drug use: No   Sexual activity: Yes  Other Topics Concern   Not on file  Social History Narrative   Regular Exercise -  YES         Social Drivers of Health   Tobacco Use: High Risk (02/02/2024)   Patient History    Smoking Tobacco Use: Never    Smokeless Tobacco Use: Current    Passive Exposure: Not on file  Financial Resource Strain: High Risk (02/15/2021)   Overall Financial Resource Strain (CARDIA)    Difficulty of Paying Living Expenses: Hard  Food Insecurity: No Food Insecurity (02/15/2021)   Hunger Vital Sign    Worried About Running Out of Food in the Last Year: Never true    Ran Out of Food in the Last Year: Never true  Transportation Needs: No Transportation Needs (02/15/2021)   PRAPARE - Administrator, Civil Service (Medical): No    Lack of Transportation (Non-Medical): No  Physical Activity: Not on file  Stress: Not on file  Social Connections: Not on file  Intimate Partner Violence: Not on file  Depression (PHQ2-9): Low Risk (03/06/2023)   Depression (PHQ2-9)    PHQ-2 Score: 0  Alcohol Screen: Medium Risk (02/15/2021)   Alcohol Screen    Last Alcohol Screening Score (AUDIT): 9  Housing: Low Risk  (02/15/2021)   Housing    Last Housing Risk Score: 0  Utilities: Not on file  Health Literacy: Not on file   Family History  Problem Relation Age of Onset   Heart disease Father 63       MI   BP (!) 140/80   Pulse 65   Wt 104.6 kg (230 lb 9.6 oz)   SpO2 98%   BMI 34.05 kg/m   Wt Readings from Last 3 Encounters:  02/02/24 104.6 kg (230 lb 9.6 oz)  01/23/24 99.8 kg (220 lb)  10/09/23 107 kg (  236 lb)   PHYSICAL EXAM: General:  Sitting up. No resp difficulty HEENT: normal Neck: supple. no JVD.  Cor: Regular rate & rhythm. No rubs, gallops or murmurs. Lungs: clear Abdomen: soft, nontender, nondistended.Good bowel sounds. Extremities: no cyanosis, clubbing, rash, edema Neuro: alert & orientedx3, cranial nerves grossly intact. moves all 4 extremities w/o difficulty. Affect pleasant  ASSESSMENT & PLAN: Chronic HFimpEF: - NICM. Tachy-mediated +/- ETOH.  - EF initially down to 20-25% with mod RV dysfunction and mod MR/TR in 1/23, improved to 50-55% by 5/23 with NSR and GDMT - cMRI (1/23): LVEF 21% RV 25%. LAA thrombus. No scar.  - R/LHC (1/23): minimal CAD, well compensated filling pressures and normal output.  - Echo 6/25: EF 60-65%, G1DD - NYHA I Volume ok  - intolerant of b-blocker - continue Farxiga  10 mg daily (has never picked up) - continue spiro 12.5 mg daily - continue losartan  50 mg daily   2. Paroxysmal Atrial Fibrillation  - Diagnosed 2/23. In setting of ETOH use.  - LAA thrombus noted on TEE, DCCV deferred. Spontaneously converted to NSR on amio. - Sleep study 5/23 negative for OSA, AHI 3.9/hr. - OSA negative; TSH normal; has cut back ETOH significantly - s/p DCCV 05/19/23 to NSR - Off amio since 5/25  - continue Eliquis  5 mg bid   3. ETOH abuse - Previous heavy drinker - Has cut back considerably, congratulated  4. H/o LAA Thrombus  - noted by CMR/TEE in 1/23, not seen on imaging since - continue eliquis  as above   5. CVA - MRI (1/23) new left MCA  embolic - No deficits.  - Continue statin + eliquis   6. HTN - BP still up a bit increase losartan  to 100   7. Raynaud's Phenomenon - finger tips change colors with numbness/tingling when temp <71F - on norvasc  5 mg daily    Analynn Daum, MD 02/02/2024  "

## 2024-02-02 NOTE — Patient Instructions (Signed)
 Medication Changes:  INCREASE Losartan  to 100 mg Daily   Special Instructions // Education:  Do the following things EVERYDAY: Weigh yourself in the morning before breakfast. Write it down and keep it in a log. Take your medicines as prescribed Eat low salt foods--Limit salt (sodium) to 2000 mg per day.  Stay as active as you can everyday Limit all fluids for the day to less than 2 liters  Please get a SMART WATCH to monitor your a-fib  Follow-Up in: CONGRATULATIONS!!! You have graduated the [facility], please follow-up with Dr Court in 6 months    At the [facility], you and your health needs are our priority. We have a designated team specialized in the treatment of Heart Failure. This Care Team includes your primary Heart Failure Specialized Cardiologist (physician), Advanced Practice Providers (APPs- Physician Assistants and Nurse Practitioners), and Pharmacist who all work together to provide you with the care you need, when you need it.   You may see any of the following providers on your designated Care Team at your next follow up:  Dr. Toribio Fuel Dr. Ezra Shuck Dr. Odis Brownie Greig Mosses, NP Caffie Shed, [place] [facility] [place], [place] Beckey Coe, NP Jordan Lee, NP Tinnie Redman, PharmD   Please be sure to bring in all your medications bottles to every appointment.   Need to Contact Us :  If you have any questions or concerns before your next appointment please send us  a message through [place] or call our office at [phone].    TO LEAVE A MESSAGE FOR THE NURSE SELECT OPTION 2, PLEASE LEAVE A MESSAGE INCLUDING: YOUR NAME DATE OF BIRTH CALL BACK NUMBER REASON FOR CALL**this is important as we prioritize the call backs  YOU WILL RECEIVE A CALL BACK THE SAME DAY AS LONG AS YOU CALL BEFORE 4:00 PM

## 2024-02-12 ENCOUNTER — Telehealth (INDEPENDENT_AMBULATORY_CARE_PROVIDER_SITE_OTHER): Payer: Self-pay | Admitting: Otolaryngology

## 2024-02-12 NOTE — Telephone Encounter (Signed)
 Patient called in and wanted to know the results of the imaging.  Also wanted to let Dr. Roark know that he still has difficulty hearing and is experiencing bouts of vertigo.  Wanted to know what the next steps would be.  Please advise.

## 2024-02-13 ENCOUNTER — Telehealth (INDEPENDENT_AMBULATORY_CARE_PROVIDER_SITE_OTHER): Payer: Self-pay

## 2024-02-13 NOTE — Telephone Encounter (Signed)
 Patient called call was answered. I let patient know that Dr. Roark next step was to get an audiology exam done patient understood. Patient asked if there was a medication that Dr. Roark could prescribe to help with the pressure I let him know I would talk to Dr. Roark and find out what he says.

## 2024-02-13 NOTE — Telephone Encounter (Signed)
 Called patient to let them know that Dr. Roark reviewed their MRI and what the next step was for the patient they did not answer I left a voicemail for them to give us  a call back.

## 2024-02-14 ENCOUNTER — Telehealth (HOSPITAL_COMMUNITY): Payer: Self-pay

## 2024-02-14 ENCOUNTER — Telehealth (INDEPENDENT_AMBULATORY_CARE_PROVIDER_SITE_OTHER): Payer: Self-pay | Admitting: Otolaryngology

## 2024-02-14 NOTE — Telephone Encounter (Signed)
 Advanced Heart Failure Patient Advocate Encounter  Patient contacted office with concerns regarding Eliquis  pricing. Patient previously had a copay card to reduce cost. Contacted [facility] to see if copay card is still on file / active and was told there is no current copay card on file.  Patient will need to register for a new copay card and provide the billing information to the pharmacy.  Left voicemail for patient.  Rachel DEL, CPhT Rx Patient Advocate Phone: [phone]

## 2024-02-14 NOTE — Telephone Encounter (Signed)
 Called patient to schedule hearing evaluation and follow up appointments per your request.  Patient is scheduled to see Dr. Lacinda on 03/13/2024 @ 3:15pm for a hearing evaluation.  I have added him to the wait list in case another appointment becomes available sooner.  He is scheduled for a follow up with Dr. Roark on 03/19/2024 @10am .  Patient would like to know if there is anything that can be prescribed to alleviate the pressure in his Right ear.  He would like a call back at [phone].

## 2024-02-20 ENCOUNTER — Telehealth (INDEPENDENT_AMBULATORY_CARE_PROVIDER_SITE_OTHER): Payer: Self-pay | Admitting: Otolaryngology

## 2024-02-20 NOTE — Telephone Encounter (Signed)
 I called him and left a message for him to call me and gave him my cell phone number regarding the pressure that he is having in the ear.  I will give him the results of the MRI scan at that time.  He still pending the audiogram which will make a difference in the treatment choice options for him.

## 2024-03-13 ENCOUNTER — Ambulatory Visit (INDEPENDENT_AMBULATORY_CARE_PROVIDER_SITE_OTHER)

## 2024-03-19 ENCOUNTER — Ambulatory Visit (INDEPENDENT_AMBULATORY_CARE_PROVIDER_SITE_OTHER): Admitting: Otolaryngology
